# Patient Record
Sex: Female | Born: 1945 | Race: Black or African American | Hispanic: No | Smoking: Former smoker
Health system: Southern US, Community
[De-identification: ages and names within clinical notes are randomized; demographics above are authoritative.]

## PROBLEM LIST (undated history)

## (undated) DIAGNOSIS — N814 Uterovaginal prolapse, unspecified: Secondary | ICD-10-CM

## (undated) DIAGNOSIS — Z9889 Other specified postprocedural states: Secondary | ICD-10-CM

## (undated) DIAGNOSIS — G9340 Encephalopathy, unspecified: Secondary | ICD-10-CM

## (undated) DIAGNOSIS — N939 Abnormal uterine and vaginal bleeding, unspecified: Secondary | ICD-10-CM

## (undated) DIAGNOSIS — I10 Essential (primary) hypertension: Secondary | ICD-10-CM

## (undated) HISTORY — PX: TUBAL LIGATION: SHX77

## (undated) HISTORY — DX: Essential (primary) hypertension: I10

---

## 2013-07-31 ENCOUNTER — Ambulatory Visit (INDEPENDENT_AMBULATORY_CARE_PROVIDER_SITE_OTHER): Payer: Medicare PPO | Admitting: Family Medicine

## 2013-07-31 VITALS — BP 160/92 | HR 67 | Temp 98.8°F | Resp 20 | Ht 66.0 in | Wt 150.0 lb

## 2013-07-31 DIAGNOSIS — F411 Generalized anxiety disorder: Secondary | ICD-10-CM

## 2013-07-31 DIAGNOSIS — I10 Essential (primary) hypertension: Secondary | ICD-10-CM

## 2013-07-31 MED ORDER — AMLODIPINE BESYLATE 5 MG PO TABS: 5.0000 mg | ORAL_TABLET | Freq: Every day | ORAL | Status: DC

## 2013-07-31 MED ORDER — CITALOPRAM HYDROBROMIDE 20 MG PO TABS: 20.0000 mg | ORAL_TABLET | Freq: Every day | ORAL | Status: DC

## 2013-07-31 NOTE — Patient Instructions (Addendum)

## 2013-07-31 NOTE — Progress Notes (Signed)
Subjective:    Patient ID: Allison Chapman, female    DOB: 1946/09/12, 67 y.o.   MRN: [id number] HPI Review of Systems Objective:   Physical Exam Assessment & Plan:    @UMFCLOGO @  Patient ID: Allison Chapman MRN: [id number], DOB: Mar 24, 1946, 67 y.o. Date of Encounter: 07/31/2013, 12:54 PM This chart was scribed for Elvina Sidle, MD by Valera Castle, ED Scribe. This patient was seen in room 02 and the patient's care was started at 12:54.  Primary Physician: No primary provider on file.  Chief Complaint: Anxiety  HPI: 67 y.o. year old female with history below presents with gradual, moderate anxiety due to the stress from her 88 year old mother being unwell. Her mother is living with her sister at the moment. She states her mother's health has been declining rapidly for the last 6 months, and that it has been really hard on the rest of the family. She reports that her mother has been the backbone of the family.  She reports associated headaches and dizziness. She states that she has been able to sleep through the anxiety symptoms.   She reports prior problem with her blood pressure, and that she had been on a mild blood pressure medicine. Her blood pressure is currently high, and she needs new medicine.   She is a retired Midwife for 30 years. She reports going to [employer] [facility] [address]   She reports that she can come back in 1 month for a follow up.   Past Medical History  Diagnosis Date  . Hypertension      Home Meds: Prior to Admission medications   Not on File    Allergies: No Known Allergies  History   Social History  . Marital Status: Divorced    Spouse Name: N/A    Number of Children: N/A  . Years of Education: N/A   Occupational History  . Not on file.   Social History Main Topics  . Smoking status: Never Smoker   . Smokeless tobacco: Not on file  . Alcohol Use: Not on file  . Drug Use: Not on file  . Sexual Activity: Not on file    Other Topics Concern  . Not on file   Social History Narrative  . No narrative on file     Review of Systems: Constitutional: negative for chills, fever, night sweats, weight changes, or fatigue  HEENT: negative for vision changes, hearing loss, congestion, rhinorrhea, ST, epistaxis, or sinus pressure Cardiovascular: negative for chest pain or palpitations Respiratory: negative for hemoptysis, wheezing, shortness of breath, or cough Abdominal: negative for abdominal pain, nausea, vomiting, diarrhea, or constipation Dermatological: negative for rash Neurologic: negative for syncope. Positive for anxiety, headaches, and dizziness. All other systems reviewed and are otherwise negative with the exception to those above and in the HPI.   Physical Exam: Blood pressure 160/92, pulse 67, temperature 98.8 F (37.1 C), temperature source Oral, resp. rate 20, height 5\' 6"  (1.676 m), weight 150 lb (68.04 kg), SpO2 96.00%., Body mass index is 24.22 kg/(m^2). General: Well developed, well nourished, in no acute distress. Head: Normocephalic, atraumatic, eyes without discharge, sclera non-icteric, nares are without discharge. Bilateral auditory canals clear, TM's are without perforation, pearly grey and translucent with reflective cone of light bilaterally. Oral cavity moist, posterior pharynx without exudate, erythema, peritonsillar abscess, or post nasal drip.  Neck: Supple. No thyromegaly. Full ROM. No lymphadenopathy. Lungs: Clear bilaterally to auscultation without wheezes, rales, or rhonchi. Breathing is unlabored. Heart:  RRR with S1 S2. No murmurs, rubs, or gallops appreciated. Abdomen: Soft, non-tender, non-distended with normoactive bowel sounds. No hepatomegaly. No rebound/guarding. No obvious abdominal masses. Msk:  Strength and tone normal for age. Extremities/Skin: Warm and dry. No clubbing or cyanosis. No edema. No rashes or suspicious lesions. Neuro: Alert and oriented X 3. Moves all  extremities spontaneously. Gait is normal. CNII-XII grossly in tact. Psych:  Responds to questions appropriately with a normal affect.   Labs:   ASSESSMENT AND PLAN:  67 y.o. year old female with Anxiety state, unspecified - Plan: citalopram (CELEXA) 20 MG tablet  Hypertension - Plan: amLODipine (NORVASC) 5 MG tablet   Recheck one month  Signed, Elvina Sidle, MD 07/31/2013 12:54 PM

## 2014-07-24 ENCOUNTER — Other Ambulatory Visit: Payer: Self-pay | Admitting: Family Medicine

## 2014-10-12 ENCOUNTER — Ambulatory Visit (INDEPENDENT_AMBULATORY_CARE_PROVIDER_SITE_OTHER): Payer: Medicare PPO | Admitting: Emergency Medicine

## 2014-10-12 VITALS — BP 168/98 | HR 85 | Temp 98.7°F | Resp 18 | Ht 65.0 in | Wt 147.8 lb

## 2014-10-12 DIAGNOSIS — IMO0001 Reserved for inherently not codable concepts without codable children: Secondary | ICD-10-CM

## 2014-10-12 DIAGNOSIS — Z87891 Personal history of nicotine dependence: Secondary | ICD-10-CM

## 2014-10-12 DIAGNOSIS — Z9119 Patient's noncompliance with other medical treatment and regimen: Secondary | ICD-10-CM

## 2014-10-12 DIAGNOSIS — Z91199 Patient's noncompliance with other medical treatment and regimen due to unspecified reason: Secondary | ICD-10-CM

## 2014-10-12 DIAGNOSIS — I10 Essential (primary) hypertension: Secondary | ICD-10-CM

## 2014-10-12 DIAGNOSIS — J00 Acute nasopharyngitis [common cold]: Secondary | ICD-10-CM

## 2014-10-12 MED ORDER — AMLODIPINE BESYLATE 5 MG PO TABS: 5.0000 mg | ORAL_TABLET | Freq: Every day | ORAL | Status: DC

## 2014-10-12 NOTE — Patient Instructions (Signed)

## 2014-10-12 NOTE — Progress Notes (Signed)
[facility] [address] [place] [address] [phone]  Date:  10/12/2014   Name:  Allison Chapman   DOB:  Sep 10, 1946   MRN:  [id number]  PCP:  No primary care provider on file.    Chief Complaint: Chills; Medication Refill; and URI   History of Present Illness:  Allison Chapman is a 68 y.o. very pleasant female patient who presents with the following:   Patient has a history of hypertension and ran out of her medication "a lot of days ago."   She was seen by EMS yesterday for chills and not transported She never had any shortness of breath or chest pain, tightness or heaviness.  No peripheral edema No nausea or vomiting.  No diaphoresis She now says she has a "cold coming on".  Rhinorrhea and non productive cough No fever or chills, no sore throat.  No headache. No myalgias or arthralgias. No improvement with over the counter medications or other home remedies.  Denies other complaint or health concern today.   There are no active problems to display for this patient.   Past Medical History  Diagnosis Date  . Hypertension     Past Surgical History  Procedure Laterality Date  . Tubal ligation      History  Substance Use Topics  . Smoking status: Never Smoker   . Smokeless tobacco: Not on file  . Alcohol Use: Not on file    Family History  Problem Relation Age of Onset  . Hypertension Mother   . Hyperthyroidism Daughter     No Known Allergies  Medication list has been reviewed and updated.  Current Outpatient Prescriptions on File Prior to Visit  Medication Sig Dispense Refill  . amLODipine (NORVASC) 5 MG tablet Take 1 tablet (5 mg total) by mouth daily. PATIENT NEEDS OFFICE VISIT FOR ADDITIONAL REFILLS 30 tablet 0  . citalopram (CELEXA) 20 MG tablet Take 1 tablet (20 mg total) by mouth daily. 30 tablet 3   No current facility-administered medications on file prior to visit.    Review of Systems:  As per HPI, otherwise  negative.    Physical Examination: Filed Vitals:   10/12/14 1519  BP: 168/98  Pulse: 85  Temp: 98.7 F (37.1 C)  Resp: 18   Filed Vitals:   10/12/14 1519  Height: 5\' 5"  (1.651 m)  Weight: 147 lb 12.8 oz (67.042 kg)   Body mass index is 24.6 kg/(m^2). Ideal Body Weight: Weight in (lb) to have BMI = 25: 149.9  GEN: WDWN, NAD, Non-toxic, A & O x 3 HEENT: Atraumatic, Normocephalic. Neck supple. No masses, No LAD. Ears and Nose: No external deformity. CV: RRR, No M/G/R. No JVD. No thrill. No extra heart sounds. PULM: CTA B, no wheezes, crackles, rhonchi. No retractions. No resp. distress. No accessory muscle use. ABD: S, NT, ND, +BS. No rebound. No HSM. EXTR: No c/c/e NEURO Normal gait.  PSYCH: Normally interactive. Conversant. Not depressed or anxious appearing.  Calm demeanor.    Assessment and Plan: Hypertension Return for BP check and fasting for labs in one month Refill  Signed,  Ellison Carwin, MD

## 2015-01-15 ENCOUNTER — Ambulatory Visit (INDEPENDENT_AMBULATORY_CARE_PROVIDER_SITE_OTHER): Payer: Medicare PPO | Admitting: Family Medicine

## 2015-01-15 VITALS — BP 194/102 | HR 74 | Temp 98.2°F | Resp 17 | Ht 65.5 in | Wt 150.0 lb

## 2015-01-15 DIAGNOSIS — I10 Essential (primary) hypertension: Secondary | ICD-10-CM

## 2015-01-15 LAB — COMPREHENSIVE METABOLIC PANEL
ALT: 10 U/L (ref 0–35)
AST: 19 U/L (ref 0–37)
Albumin: 4.4 g/dL (ref 3.5–5.2)
Alkaline Phosphatase: 64 U/L (ref 39–117)
BILIRUBIN TOTAL: 0.5 mg/dL (ref 0.2–1.2)
BUN: 20 mg/dL (ref 6–23)
CALCIUM: 10 mg/dL (ref 8.4–10.5)
CHLORIDE: 102 meq/L (ref 96–112)
CO2: 25 meq/L (ref 19–32)
Creat: 1.16 mg/dL — ABNORMAL HIGH (ref 0.50–1.10)
GLUCOSE: 98 mg/dL (ref 70–99)
Potassium: 3.9 mEq/L (ref 3.5–5.3)
Sodium: 138 mEq/L (ref 135–145)
Total Protein: 8.2 g/dL (ref 6.0–8.3)

## 2015-01-15 LAB — POCT URINALYSIS DIPSTICK
BILIRUBIN UA: NEGATIVE
Glucose, UA: NEGATIVE
Ketones, UA: NEGATIVE
NITRITE UA: NEGATIVE
PROTEIN UA: 100
Spec Grav, UA: 1.015
UROBILINOGEN UA: 0.2
pH, UA: 6

## 2015-01-15 LAB — LIPID PANEL
Cholesterol: 255 mg/dL — ABNORMAL HIGH (ref 0–200)
HDL: 81 mg/dL (ref >=46)
LDL Cholesterol: 151 mg/dL — ABNORMAL HIGH (ref 0–99)
Total CHOL/HDL Ratio: 3.1 Ratio
Triglycerides: 116 mg/dL (ref <150)
VLDL: 23 mg/dL (ref 0–40)

## 2015-01-15 MED ORDER — AMLODIPINE BESYLATE 5 MG PO TABS: 5.0000 mg | ORAL_TABLET | Freq: Every day | ORAL | Status: DC

## 2015-01-15 MED ORDER — CHLORTHALIDONE 25 MG PO TABS: 100.0000 mg | ORAL_TABLET | Freq: Every day | ORAL | Status: DC

## 2015-01-15 NOTE — Patient Instructions (Signed)
Take the amlodipine 5 mg every evening  Take the chlorthalidone 25 mg 1/2 pill daily in morning  Get regular exercise  Return in 3 months to recheck on blood pressure

## 2015-01-15 NOTE — Progress Notes (Signed)
Subjective: 68 year old lady who has been doing fairly well. She is retired. She occasionally goes walking with her daughter, but does not have a regular exercise program. She mostly sits around but has grandkids cares for from time to time. No chest pains. No shortness of breath. No stomach issues. No family history of diabetes. Nocturia 1. She has been out of her blood pressure medicine for a couple of days. She used to smoke but no longer does.:  Objective: No major distress. TMs normal. Eyes PERRLA. Throat clear. Neck supple without nodes. No carotid bruits. Chest clear to auscultation. Heart regular without murmurs gallops or arrhythmias. Abdomen soft without masses or tenderness. No ankle edema.  Blood pressure retaken at 194/110.  Assessment: Hypertension, poorly controlled  Plan: cmet, lipids, urine dip  Resume amlodipine  Add chlorthalidone

## 2015-01-18 ENCOUNTER — Encounter: Payer: Self-pay | Admitting: Family Medicine

## 2015-01-21 ENCOUNTER — Telehealth: Payer: Self-pay

## 2015-01-21 MED ORDER — PRAVASTATIN SODIUM 20 MG PO TABS: 20.0000 mg | ORAL_TABLET | Freq: Every day | ORAL | Status: DC

## 2015-01-21 NOTE — Telephone Encounter (Signed)
Pt called about labs. Notified and rx sent to pharm

## 2015-07-31 ENCOUNTER — Inpatient Hospital Stay (HOSPITAL_COMMUNITY)
Admission: EM | Admit: 2015-07-31 | Discharge: 2015-08-07 | DRG: 823 | Disposition: A | Discharge status: Home Health | Payer: Medicare PPO | Attending: Internal Medicine | Admitting: Internal Medicine

## 2015-07-31 ENCOUNTER — Emergency Department (HOSPITAL_COMMUNITY): Payer: Medicare PPO

## 2015-07-31 ENCOUNTER — Encounter (HOSPITAL_COMMUNITY): Payer: Self-pay | Admitting: *Deleted

## 2015-07-31 DIAGNOSIS — C801 Malignant (primary) neoplasm, unspecified: Secondary | ICD-10-CM | POA: Insufficient documentation

## 2015-07-31 DIAGNOSIS — C775 Secondary and unspecified malignant neoplasm of intrapelvic lymph nodes: Secondary | ICD-10-CM | POA: Diagnosis present

## 2015-07-31 DIAGNOSIS — R591 Generalized enlarged lymph nodes: Secondary | ICD-10-CM | POA: Diagnosis present

## 2015-07-31 DIAGNOSIS — D649 Anemia, unspecified: Secondary | ICD-10-CM | POA: Diagnosis present

## 2015-07-31 DIAGNOSIS — W1830XA Fall on same level, unspecified, initial encounter: Secondary | ICD-10-CM | POA: Diagnosis present

## 2015-07-31 DIAGNOSIS — E559 Vitamin D deficiency, unspecified: Secondary | ICD-10-CM | POA: Diagnosis present

## 2015-07-31 DIAGNOSIS — G9340 Encephalopathy, unspecified: Secondary | ICD-10-CM

## 2015-07-31 DIAGNOSIS — Z87891 Personal history of nicotine dependence: Secondary | ICD-10-CM | POA: Diagnosis not present

## 2015-07-31 DIAGNOSIS — R410 Disorientation, unspecified: Secondary | ICD-10-CM | POA: Diagnosis present

## 2015-07-31 DIAGNOSIS — R918 Other nonspecific abnormal finding of lung field: Secondary | ICD-10-CM | POA: Diagnosis present

## 2015-07-31 DIAGNOSIS — Z8249 Family history of ischemic heart disease and other diseases of the circulatory system: Secondary | ICD-10-CM

## 2015-07-31 DIAGNOSIS — A4150 Gram-negative sepsis, unspecified: Secondary | ICD-10-CM

## 2015-07-31 DIAGNOSIS — Z79899 Other long term (current) drug therapy: Secondary | ICD-10-CM

## 2015-07-31 DIAGNOSIS — N950 Postmenopausal bleeding: Secondary | ICD-10-CM | POA: Diagnosis present

## 2015-07-31 DIAGNOSIS — E876 Hypokalemia: Secondary | ICD-10-CM | POA: Diagnosis not present

## 2015-07-31 DIAGNOSIS — R197 Diarrhea, unspecified: Secondary | ICD-10-CM | POA: Diagnosis not present

## 2015-07-31 DIAGNOSIS — I472 Ventricular tachycardia: Secondary | ICD-10-CM | POA: Diagnosis present

## 2015-07-31 DIAGNOSIS — Z807 Family history of other malignant neoplasms of lymphoid, hematopoietic and related tissues: Secondary | ICD-10-CM | POA: Diagnosis not present

## 2015-07-31 DIAGNOSIS — E785 Hyperlipidemia, unspecified: Secondary | ICD-10-CM | POA: Diagnosis present

## 2015-07-31 DIAGNOSIS — F0390 Unspecified dementia without behavioral disturbance: Secondary | ICD-10-CM | POA: Diagnosis present

## 2015-07-31 DIAGNOSIS — N814 Uterovaginal prolapse, unspecified: Secondary | ICD-10-CM | POA: Insufficient documentation

## 2015-07-31 DIAGNOSIS — B964 Proteus (mirabilis) (morganii) as the cause of diseases classified elsewhere: Secondary | ICD-10-CM | POA: Diagnosis not present

## 2015-07-31 DIAGNOSIS — N939 Abnormal uterine and vaginal bleeding, unspecified: Secondary | ICD-10-CM | POA: Insufficient documentation

## 2015-07-31 DIAGNOSIS — I10 Essential (primary) hypertension: Secondary | ICD-10-CM | POA: Diagnosis present

## 2015-07-31 LAB — COMPREHENSIVE METABOLIC PANEL
ALBUMIN: 3.5 g/dL (ref 3.5–5.0)
ALT: 16 U/L (ref 14–54)
ANION GAP: 16 — AB (ref 5–15)
AST: 35 U/L (ref 15–41)
Alkaline Phosphatase: 71 U/L (ref 38–126)
BILIRUBIN TOTAL: 0.8 mg/dL (ref 0.3–1.2)
BUN: 12 mg/dL (ref 6–20)
CHLORIDE: 97 mmol/L — AB (ref 101–111)
CO2: 26 mmol/L (ref 22–32)
Calcium: 14.5 mg/dL (ref 8.9–10.3)
Creatinine, Ser: 1.01 mg/dL — ABNORMAL HIGH (ref 0.44–1.00)
GFR calc Af Amer: >60 mL/min (ref >60)
GFR, EST NON AFRICAN AMERICAN: 55 mL/min — AB (ref >60)
Glucose, Bld: 109 mg/dL — ABNORMAL HIGH (ref 65–99)
POTASSIUM: 3.2 mmol/L — AB (ref 3.5–5.1)
Sodium: 139 mmol/L (ref 135–145)
TOTAL PROTEIN: 9.3 g/dL — AB (ref 6.5–8.1)

## 2015-07-31 LAB — TSH: TSH: 0.422 u[IU]/mL (ref 0.350–4.500)

## 2015-07-31 LAB — CBC WITH DIFFERENTIAL/PLATELET
BASOS ABS: 0 10*3/uL (ref 0.0–0.1)
Basophils Relative: 0 %
Eosinophils Absolute: 0 10*3/uL (ref 0.0–0.7)
Eosinophils Relative: 0 %
HEMATOCRIT: 42.9 % (ref 36.0–46.0)
HEMOGLOBIN: 14.7 g/dL (ref 12.0–15.0)
LYMPHS ABS: 0.8 10*3/uL (ref 0.7–4.0)
LYMPHS PCT: 7 %
MCH: 30.5 pg (ref 26.0–34.0)
MCHC: 34.3 g/dL (ref 30.0–36.0)
MCV: 89 fL (ref 78.0–100.0)
Monocytes Absolute: 0.7 10*3/uL (ref 0.1–1.0)
Monocytes Relative: 6 %
NEUTROS ABS: 9.3 10*3/uL — AB (ref 1.7–7.7)
Neutrophils Relative %: 87 %
Platelets: 505 10*3/uL — ABNORMAL HIGH (ref 150–400)
RBC: 4.82 MIL/uL (ref 3.87–5.11)
RDW: 15.3 % (ref 11.5–15.5)
WBC: 10.8 10*3/uL — AB (ref 4.0–10.5)

## 2015-07-31 LAB — URINALYSIS, ROUTINE W REFLEX MICROSCOPIC
Bilirubin Urine: NEGATIVE
Glucose, UA: NEGATIVE mg/dL
KETONES UR: 15 mg/dL — AB
LEUKOCYTES UA: NEGATIVE
NITRITE: NEGATIVE
PH: 6 (ref 5.0–8.0)
Protein, ur: 100 mg/dL — AB
SPECIFIC GRAVITY, URINE: 1.015 (ref 1.005–1.030)
Urobilinogen, UA: 1 mg/dL (ref 0.0–1.0)

## 2015-07-31 LAB — I-STAT TROPONIN, ED: Troponin i, poc: 0.06 ng/mL (ref 0.00–0.08)

## 2015-07-31 LAB — URINE MICROSCOPIC-ADD ON

## 2015-07-31 MED ORDER — ENOXAPARIN SODIUM 40 MG/0.4ML ~~LOC~~ SOLN
40.0000 mg | SUBCUTANEOUS | Status: DC
Start: 2015-07-31 — End: 2015-08-04
  Administered 2015-07-31 – 2015-08-03 (×4): 40 mg via SUBCUTANEOUS
  Filled 2015-07-31 (×4): qty 0.4

## 2015-07-31 MED ORDER — AMLODIPINE BESYLATE 5 MG PO TABS: 5.0000 mg | ORAL_TABLET | Freq: Every day | ORAL | Status: DC

## 2015-07-31 MED ORDER — POTASSIUM CHLORIDE CRYS ER 20 MEQ PO TBCR
40.0000 meq | EXTENDED_RELEASE_TABLET | Freq: Once | ORAL | Status: AC
  Administered 2015-07-31: 40 meq via ORAL
  Filled 2015-07-31: qty 2

## 2015-07-31 MED ORDER — SODIUM CHLORIDE 0.9 % IV BOLUS (SEPSIS)
1000.0000 mL | Freq: Once | INTRAVENOUS | Status: AC
  Administered 2015-07-31: 1000 mL via INTRAVENOUS

## 2015-07-31 MED ORDER — SODIUM CHLORIDE 0.9 % IV SOLN
INTRAVENOUS | Status: DC
  Administered 2015-07-31 – 2015-08-02 (×4): via INTRAVENOUS

## 2015-07-31 MED ORDER — OXYCODONE-ACETAMINOPHEN 5-325 MG PO TABS: 1.0000 | ORAL_TABLET | Freq: Once | ORAL | Status: DC

## 2015-07-31 MED ORDER — ACETAMINOPHEN 650 MG RE SUPP
650.0000 mg | Freq: Four times a day (QID) | RECTAL | Status: DC | PRN
Start: 2015-07-31 — End: 2015-08-07
  Administered 2015-08-05: 650 mg via RECTAL
  Filled 2015-07-31: qty 1

## 2015-07-31 MED ORDER — ONDANSETRON 4 MG PO TBDP: 4.0000 mg | ORAL_TABLET | Freq: Once | ORAL | Status: DC

## 2015-07-31 MED ORDER — CALCITONIN (SALMON) 200 UNIT/ML IJ SOLN
272.0000 [IU] | Freq: Two times a day (BID) | INTRAMUSCULAR | Status: DC
  Administered 2015-07-31 – 2015-08-01 (×2): 272 [IU] via INTRAMUSCULAR
  Filled 2015-07-31 (×4): qty 1.36

## 2015-07-31 MED ORDER — ACETAMINOPHEN 325 MG PO TABS
650.0000 mg | ORAL_TABLET | Freq: Four times a day (QID) | ORAL | Status: DC | PRN
  Administered 2015-08-04: 650 mg via ORAL
  Filled 2015-07-31: qty 2

## 2015-07-31 NOTE — ED Provider Notes (Addendum)
CSN: [id number]     Arrival date & time 07/31/15  1345 History   First MD Initiated Contact with Patient 07/31/15 1347     Chief Complaint  Patient presents with  . Altered Mental Status  . Fall     (Consider location/radiation/quality/duration/timing/severity/associated sxs/prior Treatment) HPI  69 year old female presenting today from home. Patient's friend went to go pick her up to go somewhere and she did not answer the door nor her phone. Patient was found down in the bathroom. She states that her right knee became weak and she fell down. Patient was mildly confused at first. However once EMS got there and since then she's been alert and oriented 3.  Patient had no symptoms before falling. She had no fever no cough, no shortness of breath, no dysuria. She has been having trouble with that right knee.   Past Medical History  Diagnosis Date  . Hypertension    Past Surgical History  Procedure Laterality Date  . Tubal ligation     Family History  Problem Relation Age of Onset  . Hypertension Mother   . Hyperthyroidism Daughter    Social History  Substance Use Topics  . Smoking status: Never Smoker   . Smokeless tobacco: None  . Alcohol Use: None   OB History    No data available     Review of Systems  Constitutional: Negative for fever, activity change and fatigue.  HENT: Negative for congestion.   Eyes: Negative for discharge.  Respiratory: Negative for cough and chest tightness.   Cardiovascular: Negative for chest pain.  Gastrointestinal: Negative for abdominal distention.  Genitourinary: Negative for dysuria and difficulty urinating.  Musculoskeletal: Negative for back pain and joint swelling.  Skin: Negative for rash.  Allergic/Immunologic: Negative for immunocompromised state.  Neurological: Negative for dizziness, seizures, speech difficulty and headaches.  Psychiatric/Behavioral: Positive for confusion. Negative for behavioral problems and agitation.       Allergies  Review of patient's allergies indicates no known allergies.  Home Medications   Prior to Admission medications   Medication Sig Start Date End Date Taking? Authorizing Provider  amLODipine (NORVASC) 5 MG tablet Take 1 tablet (5 mg total) by mouth daily. 01/15/15  Yes Posey Boyer, MD  chlorthalidone (HYGROTON) 25 MG tablet Take 4 tablets (100 mg total) by mouth daily. Patient not taking: Reported on 07/31/2015 01/15/15   Posey Boyer, MD  citalopram (CELEXA) 20 MG tablet Take 1 tablet (20 mg total) by mouth daily. Patient not taking: Reported on 07/31/2015 07/31/13   Robyn Haber, MD  pravastatin (PRAVACHOL) 20 MG tablet Take 1 tablet (20 mg total) by mouth daily. Patient not taking: Reported on 07/31/2015 01/21/15   Posey Boyer, MD   BP 149/109 mmHg  Temp(Src) 97.4 F (36.3 C) (Oral)  Resp 16  Ht 5\' 5"  (1.651 m)  SpO2 100% Physical Exam  Constitutional: She is oriented to person, place, and time. She appears well-developed and well-nourished.  HENT:  Head: Normocephalic and atraumatic.  Eyes: Conjunctivae are normal. Right eye exhibits no discharge.  Neck: Neck supple.  Cardiovascular: Normal rate, regular rhythm and normal heart sounds.   No murmur heard. Pulmonary/Chest: Effort normal and breath sounds normal. She has no wheezes. She has no rales.  Abdominal: Soft. She exhibits no distension. There is no tenderness.  Genitourinary:  Vaginal/uterus prolapse  Musculoskeletal: Normal range of motion. She exhibits no edema.  Mild eyrthema over right knee  Neurological: She is oriented to person, place, and time.  No cranial nerve deficit.  Skin: Skin is warm and dry. No rash noted. She is not diaphoretic.  Psychiatric: She has a normal mood and affect. Her behavior is normal.  Nursing note and vitals reviewed.   ED Course  Procedures (including critical care time) Labs Review Labs Reviewed  COMPREHENSIVE METABOLIC PANEL - Abnormal; Notable for the  following:    Potassium 3.2 (*)    Chloride 97 (*)    Glucose, Bld 109 (*)    Creatinine, Ser 1.01 (*)    Calcium 14.5 (*)    Total Protein 9.3 (*)    GFR calc non Af Amer 55 (*)    Anion gap 16 (*)    All other components within normal limits  CBC WITH DIFFERENTIAL/PLATELET - Abnormal; Notable for the following:    WBC 10.8 (*)    Platelets 505 (*)    Neutro Abs 9.3 (*)    All other components within normal limits  URINE CULTURE  URINALYSIS, ROUTINE W REFLEX MICROSCOPIC (NOT AT [facility])  I-STAT TROPOININ, ED    Imaging Review Dg Chest 2 View  07/31/2015   CLINICAL DATA:  [place] this morning, hypertension, weakness in legs for 2 months  EXAM: CHEST  2 VIEW  COMPARISON:  None  FINDINGS: Normal heart size, mediastinal contours and pulmonary vascularity.  Calcification and mild tortuosity of thoracic aorta.  Mild bibasilar atelectasis.  Lungs otherwise clear.  No pleural effusion or pneumothorax.  Bones demineralized.  IMPRESSION: Mild bibasilar atelectasis.   Electronically Signed   By: Lavonia Dana M.D.   On: 07/31/2015 16:00   Dg Knee Complete 4 Views Right  07/31/2015   CLINICAL DATA:  Acute right knee pain after falling this morning. Initial encounter.  EXAM: RIGHT KNEE - COMPLETE 4+ VIEW  COMPARISON:  None.  FINDINGS: There is no evidence of fracture, dislocation, or joint effusion. There is no evidence of arthropathy or other focal bone abnormality. Soft tissues are unremarkable.  IMPRESSION: Normal right knee.   Electronically Signed   By: Marijo Conception, M.D.   On: 07/31/2015 16:01   I have personally reviewed and evaluated these images and lab results as part of my medical decision-making.   EKG Interpretation   Date/Time:  Thursday July 31 2015 13:57:54 EDT Ventricular Rate:  108 PR Interval:  171 QRS Duration: 88 QT Interval:  345 QTC Calculation: 462 R Axis:   67 Text Interpretation:  Sinus tachycardia Borderline repolarization  abnormality no acute ischemia Confirmed  by Gerald Leitz ([id number]) on  07/31/2015 4:10:27 PM      MDM   Final diagnoses:  None    Patient is a 69 year old female presenting with fall in the bathroom. Patient has small abrasion to her right knee. Patient was confused when family arrived before EMS.Since she has been here she is alert and oriented 3. Her neuro exam is normal.  We will do screening labs for the elderly including UA ,chest x-ray. We'll get a CT brain given her brief time of confusion.  Will get xray of right knee for abrasion and knee pain.    4:24 PM Hypercalcemia. Will admit to work up. Patietn has no PCP and goes to assorted urgent care for assorted BP meds.    Kemonie Cutillo Julio Alm, MD 07/31/15 [place], MD 07/31/15 1625

## 2015-07-31 NOTE — H&P (Signed)
Date: 07/31/2015               Patient Name:  Allison Chapman MRN: [id number]  DOB: 1946/02/02 Age / Sex: 69 y.o., female   PCP: No primary care provider on file.              Medical Service: Internal Medicine Teaching Service              Attending Physician: Dr. Sid Falcon, MD    First Contact: Florentina Addison, MS 4 Pager: [phone]  Second Contact: Dr. Osa Craver Pager: [phone]  Third Contact Dr.  Karalee Height: [phone]-       After Hours (After 5p/  First Contact Pager: [phone]  weekends / holidays): Second Contact Pager: [phone]   Chief Complaint:  Fall and Altered Mental Status  History of Present Illness: Allison Chapman is a 69 y.o. female with a PMH of HTN who presented to the [facility] ED following a fall and brief episode of altered mental status. Patient was found down in a state of altered mental status at home by her daughter. EMS was called and patients mental status had improved and she was alert and oriented by arrival.  Patient states that the fall occurred when she felt her right knee give out. She struck her right knee during the fall.  She denies hitting her head and states that she was only on the ground for a brief period of time. She also  denies  dizziness, weakness, palpatations or loss of consciousness prior to or after the fall. She endorses a several month history of weakness in her right knee. Additionally, she endorses a 3-4 day history of decreased appetite, fatigue and polydipsia. She denies fevers, sweats, chills, weight loss, dysuria, hematuria, chest pain, joint pain, bone pain, breast lumps, SOB, vomiting, hematochezia, diarrhea and constipation. She denies any history of malignancy, thyroid disorders, or kidney stones. Her only medication is amlodipine for HTN. She does not take any supplements or OTC medication.   Of note, patient has not had a PCP in years. Given this and her age, it has been over 10 years since her last mammogram, pap smear, and  physical.   Of note, patient was found to be markedly hypercalcemic at 14.5 n the ED. She was also mildly hypokalemic at 3.2.  Head CT revealed no acute processes concerning for IC bleeding. Xray of the right knee did not reveal any fractures or bony abnormalities. She received 2L of NS while in the ED with some resolution of symptoms. He daughter states that she is mentally at her baseline.   Meds: No current facility-administered medications for this encounter.   Current Outpatient Prescriptions  Medication Sig Dispense Refill  . amLODipine (NORVASC) 5 MG tablet Take 1 tablet (5 mg total) by mouth daily. 90 tablet 3  . chlorthalidone (HYGROTON) 25 MG tablet Take 4 tablets (100 mg total) by mouth daily. (Patient not taking: Reported on 07/31/2015) 45 tablet 5  . citalopram (CELEXA) 20 MG tablet Take 1 tablet (20 mg total) by mouth daily. (Patient not taking: Reported on 07/31/2015) 30 tablet 3  . pravastatin (PRAVACHOL) 20 MG tablet Take 1 tablet (20 mg total) by mouth daily. (Patient not taking: Reported on 07/31/2015) 30 tablet 3    Allergies: Allergies as of 07/31/2015  . (No Known Allergies)   Past Medical History  Diagnosis Date  . Hypertension    Past Surgical History  Procedure Laterality Date  . Tubal ligation  Family History  Problem Relation Age of Onset  . Hypertension Mother   . Hyperthyroidism Daughter    Social History   Social History  . Marital Status: Divorced    Spouse Name: N/A  . Number of Children: N/A  . Years of Education: N/A   Occupational History  . Not on file.   Social History Main Topics  . Smoking status: Never Smoker   . Smokeless tobacco: Not on file  . Alcohol Use: Not on file  . Drug Use: Not on file  . Sexual Activity: Not on file   Other Topics Concern  . Not on file   Social History Narrative    Review of Systems: Pertinent items noted in HPI and remainder of comprehensive ROS otherwise negative.  Physical Exam: Blood  pressure 158/91, pulse 105, temperature 97.4 F (36.3 C), temperature source Oral, resp. rate 19, height 5' 5"  (1.651 m), SpO2 95 %.  Physical Exam  Constitutional: She is oriented to person, place, and time.  Pleasant, thin, elderly female laying in bed in NAD  HENT:  Head: Normocephalic and atraumatic.  Right Ear: External ear normal.  Left Ear: External ear normal.  Mouth/Throat: No oropharyngeal exudate.  No evidence of hemotympanum bilaterally  Eyes: Conjunctivae and EOM are normal. Pupils are equal, round, and reactive to light. No scleral icterus.  Neck: Normal range of motion. No JVD present. No tracheal deviation present. No thyromegaly present.  Cardiovascular: Regular rhythm.  Exam reveals no gallop and no friction rub.   No murmur heard. Tachycardic    Pulmonary/Chest: Effort normal and breath sounds normal. She has no wheezes.  Abdominal: Soft. Bowel sounds are normal. She exhibits no distension. There is no tenderness. There is no rebound and no guarding.  Musculoskeletal: Normal range of motion. She exhibits edema (Some edema on right knee).  Lymphadenopathy:    She has no cervical adenopathy.  Neurological: She is alert and oriented to person, place, and time. No cranial nerve deficit. She exhibits normal muscle tone. Coordination normal.  Skin: Skin is warm and dry. No rash noted.  Dark plaque on breast, consistent with Seborrheic keratosis   Psychiatric: She has a normal mood and affect.     Lab results: Results for orders placed or performed during the hospital encounter of 07/31/15 (from the past 24 hour(s))  I-stat troponin, ED     Status: None   Collection Time: 07/31/15  2:41 PM  Result Value Ref Range   Troponin i, poc 0.06 0.00 - 0.08 ng/mL   Comment 3          Comprehensive metabolic panel     Status: Abnormal   Collection Time: 07/31/15  3:19 PM  Result Value Ref Range   Sodium 139 135 - 145 mmol/L   Potassium 3.2 (L) 3.5 - 5.1 mmol/L   Chloride  97 (L) 101 - 111 mmol/L   CO2 26 22 - 32 mmol/L   Glucose, Bld 109 (H) 65 - 99 mg/dL   BUN 12 6 - 20 mg/dL   Creatinine, Ser 1.01 (H) 0.44 - 1.00 mg/dL   Calcium 14.5 (HH) 8.9 - 10.3 mg/dL   Total Protein 9.3 (H) 6.5 - 8.1 g/dL   Albumin 3.5 3.5 - 5.0 g/dL   AST 35 15 - 41 U/L   ALT 16 14 - 54 U/L   Alkaline Phosphatase 71 38 - 126 U/L   Total Bilirubin 0.8 0.3 - 1.2 mg/dL   GFR calc non Af Wyvonnia Lora  55 (L) >60 mL/min   GFR calc Af Amer >60 >60 mL/min   Anion gap 16 (H) 5 - 15  CBC with Differential/Platelet     Status: Abnormal   Collection Time: 07/31/15  3:19 PM  Result Value Ref Range   WBC 10.8 (H) 4.0 - 10.5 K/uL   RBC 4.82 3.87 - 5.11 MIL/uL   Hemoglobin 14.7 12.0 - 15.0 g/dL   HCT 42.9 36.0 - 46.0 %   MCV 89.0 78.0 - 100.0 fL   MCH 30.5 26.0 - 34.0 pg   MCHC 34.3 30.0 - 36.0 g/dL   RDW 15.3 11.5 - 15.5 %   Platelets 505 (H) 150 - 400 K/uL   Neutrophils Relative % 87 %   Neutro Abs 9.3 (H) 1.7 - 7.7 K/uL   Lymphocytes Relative 7 %   Lymphs Abs 0.8 0.7 - 4.0 K/uL   Monocytes Relative 6 %   Monocytes Absolute 0.7 0.1 - 1.0 K/uL   Eosinophils Relative 0 %   Eosinophils Absolute 0.0 0.0 - 0.7 K/uL   Basophils Relative 0 %   Basophils Absolute 0.0 0.0 - 0.1 K/uL     Imaging results:  Dg Chest 2 View  07/31/2015   CLINICAL DATA:  [place] this morning, hypertension, weakness in legs for 2 months  EXAM: CHEST  2 VIEW  COMPARISON:  None  FINDINGS: Normal heart size, mediastinal contours and pulmonary vascularity.  Calcification and mild tortuosity of thoracic aorta.  Mild bibasilar atelectasis.  Lungs otherwise clear.  No pleural effusion or pneumothorax.  Bones demineralized.  IMPRESSION: Mild bibasilar atelectasis.   Electronically Signed   By: Lavonia Dana M.D.   On: 07/31/2015 16:00   Ct Head Wo Contrast  07/31/2015   CLINICAL DATA:  69YOF right knee gave out on her today and she lowered herself to the floor. Per EMS pts family stated that pt has been slow to respond today and  that she has a Possible right side facial droop. EMS noted foul smelling urine  EXAM: CT HEAD WITHOUT CONTRAST  TECHNIQUE: Contiguous axial images were obtained from the base of the skull through the vertex without intravenous contrast.  COMPARISON:  None.  FINDINGS: Probable retention cysts or polyps in the maxillary sinuses, incompletely visualized. Atherosclerotic and physiologic intracranial calcifications. Early right basal ganglia mineralization. Diffuse parenchymal atrophy. Patchy areas of hypoattenuation in deep and periventricular white matter bilaterally. Negative for acute intracranial hemorrhage, mass lesion, acute infarction, midline shift, or mass-effect. Acute infarct may be inapparent on noncontrast CT. Ventricles and sulci symmetric. Bone windows demonstrate no focal lesion.  IMPRESSION: 1. Negative for bleed or other acute intracranial process. 2. Atrophy and nonspecific white matter changes.   Electronically Signed   By: Lucrezia Europe M.D.   On: 07/31/2015 16:14   Dg Knee Complete 4 Views Right  07/31/2015   CLINICAL DATA:  Acute right knee pain after falling this morning. Initial encounter.  EXAM: RIGHT KNEE - COMPLETE 4+ VIEW  COMPARISON:  None.  FINDINGS: There is no evidence of fracture, dislocation, or joint effusion. There is no evidence of arthropathy or other focal bone abnormality. Soft tissues are unremarkable.  IMPRESSION: Normal right knee.   Electronically Signed   By: Marijo Conception, M.D.   On: 07/31/2015 16:01    Other results: EKG: sinus tachycardia, no ischemic changes.  Assessment & Plan by Problem: Active Problems:   Hypercalcemia  Summary: DEION FORGUE is a 69 y.o. female with a PMH of  HTN who presented to the ED for AMS following a fall. CT was negative for acute process. Patient found to be markedly hypercalcemic at 14.5. Concerning for hypercalcemia of malignancy vs MM vs hyperparathyroidism  ##Hypercalcemia- Ca: 14.5. Given patient's age concerning for  hypercalcemia of malignancy, multiple myeloma, hyperparathyroidism.  -IVF: Normal Saline 200 cc/hr until 100-150 cc urine output/hr -Calcitonin 4 U/kg BID -repeat Ca at midnight -CBC and CMP in morning  -f/u PTH -f/u PTH related peptide -f/u 25-hydroxy vitamin D & 1,25 dihydroxyvitamin d -f/u SPEP  ##Alerted Mental Status: Likely 2/2 to hypercalcemia. Also included in differential are UTI, stroke and IC bleed. Negative CT and normal neuro exam virtually exclude stoke or IC bleed.  -f/u Urinalysis -f/u CBC  ##Hypertension;  -continue home amlodipine 5 mg daily   ##DVT PPx:  -Lovenox 40 SQ daily  ##Dispo: Dispostion deferred at this time, awaiting improvement of symptoms. Anticipated discharge in 1-2 days.    This is a Careers information officer Note.  The care of the patient was discussed with Dr. Marvel Plan and the assessment and plan was formulated with their assistance.  Please see their note for official documentation of the patient encounter.   Signed: Florentina Addison, Med Student 07/31/2015, 5:38 PM

## 2015-07-31 NOTE — H&P (Signed)
Date: 07/31/2015               Patient Name:  Allison Chapman MRN: [id number]  DOB: 11-Jun-1946 Age / Sex: 69 y.o., female   PCP: No primary care provider on file.         Medical Service: Internal Medicine Teaching Service         Attending Physician: Dr. Sid Falcon, MD    First Contact: Florentina Addison, [place] Pager: [phone]  Second Contact: Dr. Marvel Plan Pager: [phone]       After Hours (After 5p/  First Contact Pager: [phone]  weekends / holidays): Second Contact Pager: [phone]   Chief Complaint: confusion  History of Present Illness: Allison Chapman is a 69 yo female with PMHx of HTN and HLD who presented to the ED after falling. She states she went to the bathroom and fell to the floor. She states she was not on the floor for very long. Patient's sister and daughter had arrived at the home and found her on the bathroom floor. She was confused and altered which lasted for about 30 minutes. Patient doesn't know what caused her to fall and denies any symptoms prior to falling. She states she just "slipped down." Her right knee felt weak at the time. She denies any chest pain, shortness of breath, lightheadedness or confusion prior to the fall. She did hit her right knee when she fell to the ground and she is currently having some pain in her right knee. Patient's sister and daughter found her on the floor in the bathroom and called EMS.   In the ED, vitals were stable. CBC wnl. LFTs wnl. CXR showed mild bibasilar atelectasis. CT head showed no signs of bleeding or other acute abnormalities. EKG showed sinus tachycardia. CMET revealed hypercalemia of 14.5 (corrected 14.9). Albumin 3.5.   She does admit to increased polydipsia for 1-2 days. She states she has had a decreased appetite and decreased food intake for the past 3-4 days which she contributes to nausea. Patient also agrees to increased fatigue for the last few weeks which her daughter who lives with her agrees with. Patient has had  right knee pain for the past 3 months, but denies any weakness or any bone pain.   Patient denies fever, chills, weight loss, night sweats, chest pain, shortness of breath, palpitations, cough, hemoptysis, abdominal pain, diarrhea, constipation, blood in her stool, increased urination, dysuria, decreased urination. She denies any decreased concentration or increased confusion recently other than the episode today.   Patient denies taking any new supplements such as vitamins. Patient did recently start a new blood pressure medication recently, amlodipine. She was previously on chlorthalidone, but has not been taking it in a while.   Patient has never had any surgeries. She has never had any kidney stones, pancreatitis, or stomach ulcers. She has not had a mammogram in greater than 10 years, but denies feeling any new lumps. Her last mammogram was normal. She has not had a pap smear in a long time, but her last pap was reportedly normal. She has never had a colonoscopy. She has never had her thyroid checked.   Family Hx: Patient's mother had lymphoma, recently passed at 95.   Social Hx: Previous smoker <1ppd for about 10-15 years. Denies alcohol use. Denies IVDU.   Meds: Current Facility-Administered Medications  Medication Dose Route Frequency Provider Last Rate Last Dose  . 0.9 %  sodium chloride infusion   Intravenous Continuous Alexa Sherral Hammers,  MD      . acetaminophen (TYLENOL) tablet 650 mg  650 mg Oral Q6H PRN Alexa Sherral Hammers, MD       Or  . acetaminophen (TYLENOL) suppository 650 mg  650 mg Rectal Q6H PRN Alexa Sherral Hammers, MD      . Derrill Memo ON 08/01/2015] amLODipine (NORVASC) tablet 5 mg  5 mg Oral Daily Alexa Sherral Hammers, MD      . calcitonin (MIACALCIN) injection 272 Units  272 Units Intramuscular BID Alexa Sherral Hammers, MD      . enoxaparin (LOVENOX) injection 40 mg  40 mg Subcutaneous Q24H Alexa Sherral Hammers, MD      . potassium chloride SA (K-DUR,KLOR-CON) CR tablet 40 mEq  40  mEq Oral Once Alexa Sherral Hammers, MD        Allergies: Allergies as of 07/31/2015  . (No Known Allergies)   Past Medical History  Diagnosis Date  . Hypertension    Past Surgical History  Procedure Laterality Date  . Tubal ligation     Family History  Problem Relation Age of Onset  . Hypertension Mother   . Cancer Mother   . Hyperthyroidism Daughter    Social History   Social History  . Marital Status: Divorced    Spouse Name: N/A  . Number of Children: N/A  . Years of Education: N/A   Occupational History  . Not on file.   Social History Main Topics  . Smoking status: Never Smoker   . Smokeless tobacco: Not on file  . Alcohol Use: Not on file  . Drug Use: Not on file  . Sexual Activity: Not on file   Other Topics Concern  . Not on file   Social History Narrative    Review of Systems: General: Admits to fatigue and decreased appetite. Denies fever, chills, weight loss, night sweats, and diaphoresis.  Respiratory: Denies SOB, cough, DOE, chest tightness, and wheezing.   Cardiovascular: Denies chest pain and palpitations.  Gastrointestinal: Admits to nausea. Denies vomiting, abdominal pain, diarrhea, constipation, blood in stool and abdominal distention.  Genitourinary: Denies dysuria, urgency, frequency, hematuria, suprapubic pain and flank pain. Endocrine: Admits to polydipsia. Denies hot or cold intolerance, polyuria. Musculoskeletal: Admits to right knee. Denies myalgias, back pain, joint swelling, and gait problem.  Skin: Denies pallor, rash and wounds.  Neurological: Denies dizziness, headaches, weakness, lightheadedness, numbness, seizures, and syncope Psychiatric/Behavioral: Admits to confusion (resolved).   Physical Exam: Filed Vitals:   07/31/15 1445 07/31/15 1500 07/31/15 1615 07/31/15 1630  BP: 173/101 168/94  158/91  Pulse: 102 98 104 105  Temp:      TempSrc:      Resp: 17 16 17 19   Height:      SpO2: 96% 96% 95% 95%   General: Vital signs  reviewed.  Patient is elderly, well-developed and well-nourished, in no acute distress and cooperative with exam.  Head: Normocephalic and atraumatic. Eyes: EOMI, conjunctivae normal, no scleral icterus.  Neck: Supple, trachea midline, normal ROM, no JVD, masses, thyromegaly. No anterior or posterior cervical lymphadenopathy. No supraclavicular lymph nodes.  Cardiovascular: Tachycardic, regular rhythm, S1 normal, S2 normal, no murmurs, gallops, or rubs. Breasts: breasts appear normal, no suspicious masses, no skin or nipple changes or axillary nodes. No axillary lymphadenopathy. Seborrheic keratosis on right breast.  Pulmonary/Chest: Clear to auscultation bilaterally, no wheezes, rales, or rhonchi. Abdominal: Soft, non-tender, non-distended, BS +, no masses, organomegaly, or guarding present.  Musculoskeletal: Erythematous abrasion on right knee. Non-tender, no effusions.  Extremities: No  lower extremity edema bilaterally, pulses symmetric and intact bilaterally.  Neurological: A&O x2 (not year), Strength is normal and symmetric bilaterally, cranial nerve II-XII are grossly intact, no focal motor deficit, sensory intact to light touch bilaterally.  Skin: Warm, dry and intact. No rashes or erythema. Psychiatric: Normal mood and affect. Speech is slow and behavior is normal. Cognition and memory are normal.   Lab results: Basic Metabolic Panel:  Recent Labs  07/31/15 1519  NA 139  K 3.2*  CL 97*  CO2 26  GLUCOSE 109*  BUN 12  CREATININE 1.01*  CALCIUM 14.5*   Liver Function Tests:  Recent Labs  07/31/15 1519  AST 35  ALT 16  ALKPHOS 71  BILITOT 0.8  PROT 9.3*  ALBUMIN 3.5   CBC:  Recent Labs  07/31/15 1519  WBC 10.8*  NEUTROABS 9.3*  HGB 14.7  HCT 42.9  MCV 89.0  PLT 505*   Imaging results:  Dg Chest 2 View  07/31/2015   CLINICAL DATA:  [place] this morning, hypertension, weakness in legs for 2 months  EXAM: CHEST  2 VIEW  COMPARISON:  None  FINDINGS: Normal heart  size, mediastinal contours and pulmonary vascularity.  Calcification and mild tortuosity of thoracic aorta.  Mild bibasilar atelectasis.  Lungs otherwise clear.  No pleural effusion or pneumothorax.  Bones demineralized.  IMPRESSION: Mild bibasilar atelectasis.   Electronically Signed   By: Lavonia Dana M.D.   On: 07/31/2015 16:00   Ct Head Wo Contrast  07/31/2015   CLINICAL DATA:  69YOF right knee gave out on her today and she lowered herself to the floor. Per EMS pts family stated that pt has been slow to respond today and that she has a Possible right side facial droop. EMS noted foul smelling urine  EXAM: CT HEAD WITHOUT CONTRAST  TECHNIQUE: Contiguous axial images were obtained from the base of the skull through the vertex without intravenous contrast.  COMPARISON:  None.  FINDINGS: Probable retention cysts or polyps in the maxillary sinuses, incompletely visualized. Atherosclerotic and physiologic intracranial calcifications. Early right basal ganglia mineralization. Diffuse parenchymal atrophy. Patchy areas of hypoattenuation in deep and periventricular white matter bilaterally. Negative for acute intracranial hemorrhage, mass lesion, acute infarction, midline shift, or mass-effect. Acute infarct may be inapparent on noncontrast CT. Ventricles and sulci symmetric. Bone windows demonstrate no focal lesion.  IMPRESSION: 1. Negative for bleed or other acute intracranial process. 2. Atrophy and nonspecific white matter changes.   Electronically Signed   By: Lucrezia Europe M.D.   On: 07/31/2015 16:14   Dg Knee Complete 4 Views Right  07/31/2015   CLINICAL DATA:  Acute right knee pain after falling this morning. Initial encounter.  EXAM: RIGHT KNEE - COMPLETE 4+ VIEW  COMPARISON:  None.  FINDINGS: There is no evidence of fracture, dislocation, or joint effusion. There is no evidence of arthropathy or other focal bone abnormality. Soft tissues are unremarkable.  IMPRESSION: Normal right knee.   Electronically  Signed   By: Marijo Conception, M.D.   On: 07/31/2015 16:01    Other results: EKG: sinus tachycardia, no priors for comparison.  Assessment & Plan by Problem: Active Problems:   Hypercalcemia  Severe Symptomatic Hypercalcemia: Patient presents with confusion after fall. Work up in the ED is significant for hypercalcemia with corrected calcium 14.9 with albumin 3.5. Last calcium normal 10 with albumin of 4.4 in March 2016. Patient falls under severe symptomatic hypercalcemia with a calcium > 14 and symptoms of confusion, polydipsia,  fatigue and nausea. Given the severity of calcemia and the quick onset, hypercalcemia is more concerning for and consistent with malignancy versus primary hyperparathyroidism. However, differential includes primary hyperparathyroidism, hypercalcemia of malignancy, thiazide diuretics (less likely as patient is no longer taking-previously on chlorthalidone), hyperthyroidism, and adrenal insufficiency. Patient was admitted for treatment of hypercalcemia and further work up. Patient received 2L of NS boluses in the ED.  -NS 200 cc/hr, titrate to 100-150 cc urine output per hour  -Calcitonin 272 units (4 units/kg) BID, can increase to Q6H if not effective -Repeat Calcium at 0000 -Repeat CMET tomorrow am -Repeat Calcium now -PTH and ionized calcium (for primary hyperparathyroidism) -PTH-related peptide (for humoral hypercalcemia of malignancy if elevated) -TSH  -Vitamin D 25-OH (think vitamin D intoxication if elevated-doubt) -Vitamin D 1,25-dihydroxy (think lymphoma, granulomatous disease if elevated) -SPEP and light chains to rule out multiple myeloma -Will need to reassess tonight to ensure patient is not becoming volume overloaded  -Consider zoledronic acid IV if hypercalcemia persists despite IVF and calcitonin  Acute Encephalopathy: Resolved. Patient presents after confusion after a fall this morning. No symptoms prior to the fall, and patient did not hit her head.  Confusion has now resolved, and per family patient is at her baseline. Patient's vital signs are stable. Labs as above, CXR wnl, CT head normal, neuro exam normal, EKG shows sinus tachycardia. Doubt ACS, CVA, TIA. Patient does have foul smelling urine, but no symptoms, afebrile, very mild leukocytosis 10.8. ED checked UA and UCx. Acute encephalopathy likely secondary to hypercalcemia.  -Urinalysis -Urine culture -CBC/CMET tomorrow am -Treat hypercalcemia as above  HTN: 149/109 on admission. Patient is on amlodipine at home.  -Continue amlodipine 5 mg daily  Hypokalemia: K 3.2 on admission. -Kdur 40 mEq once -Repeat CMET tomorrow am  DVT/PE ppx: Lovenox SQ QD  Dispo: Disposition is deferred at this time, awaiting improvement of current medical problems. Anticipated discharge in approximately 1-2 day(s).   The patient does not have a current PCP (No primary care provider on file.) and does need an [facility] hospital follow-up appointment after discharge.  The patient does not have transportation limitations that hinder transportation to clinic appointments.  Signed: Osa Craver, DO PGY-2 Internal Medicine Resident Pager # [phone] 07/31/2015 6:39 PM

## 2015-07-31 NOTE — ED Notes (Signed)
Pt left for CT.

## 2015-07-31 NOTE — ED Notes (Signed)
Pt's right knee gave out on her today and she lowered herself to the floor.  Per EMS pts family stated that pt has been slow to respond today and that she has a Possible right side facial droop.  EMS noted foul smelling urine at the scene.  Pts right knee has bothered her for quite sometime.  EMS brought her to the ED for further evaluation.  EMS noted that that there were no stroke deficits noted.  VS per ems are as follows: BP:180/108 HR: 110 CBG: 112 SPO2: 100% on RA

## 2015-07-31 NOTE — ED Notes (Signed)
Attempted report X1

## 2015-08-01 ENCOUNTER — Other Ambulatory Visit (HOSPITAL_COMMUNITY)
Admission: RE | Admit: 2015-08-01 | Discharge: 2015-08-01 | Disposition: A | Discharge status: Home / Self Care | Payer: Medicare PPO | Source: Ambulatory Visit | Attending: Internal Medicine | Admitting: Internal Medicine

## 2015-08-01 DIAGNOSIS — E785 Hyperlipidemia, unspecified: Secondary | ICD-10-CM

## 2015-08-01 DIAGNOSIS — N938 Other specified abnormal uterine and vaginal bleeding: Secondary | ICD-10-CM

## 2015-08-01 DIAGNOSIS — I472 Ventricular tachycardia: Secondary | ICD-10-CM

## 2015-08-01 DIAGNOSIS — I10 Essential (primary) hypertension: Secondary | ICD-10-CM

## 2015-08-01 DIAGNOSIS — E559 Vitamin D deficiency, unspecified: Secondary | ICD-10-CM | POA: Diagnosis present

## 2015-08-01 DIAGNOSIS — Z01411 Encounter for gynecological examination (general) (routine) with abnormal findings: Secondary | ICD-10-CM | POA: Insufficient documentation

## 2015-08-01 DIAGNOSIS — E876 Hypokalemia: Secondary | ICD-10-CM

## 2015-08-01 DIAGNOSIS — N814 Uterovaginal prolapse, unspecified: Secondary | ICD-10-CM

## 2015-08-01 LAB — PROTEIN ELECTROPHORESIS, SERUM
A/G Ratio: 0.6 — ABNORMAL LOW (ref 0.7–1.7)
ALPHA-2-GLOBULIN: 1.3 g/dL — AB (ref 0.4–1.0)
Albumin ELP: 3.1 g/dL (ref 2.9–4.4)
Alpha-1-Globulin: 0.4 g/dL (ref 0.0–0.4)
BETA GLOBULIN: 1.3 g/dL (ref 0.7–1.3)
GAMMA GLOBULIN: 2.4 g/dL — AB (ref 0.4–1.8)
Globulin, Total: 5.5 g/dL — ABNORMAL HIGH (ref 2.2–3.9)
Total Protein ELP: 8.6 g/dL — ABNORMAL HIGH (ref 6.0–8.5)

## 2015-08-01 LAB — COMPREHENSIVE METABOLIC PANEL
ALK PHOS: 65 U/L (ref 38–126)
ALT: 17 U/L (ref 14–54)
AST: 37 U/L (ref 15–41)
Albumin: 2.9 g/dL — ABNORMAL LOW (ref 3.5–5.0)
Anion gap: 14 (ref 5–15)
BUN: 9 mg/dL (ref 6–20)
CALCIUM: 12 mg/dL — AB (ref 8.9–10.3)
CHLORIDE: 96 mmol/L — AB (ref 101–111)
CO2: 26 mmol/L (ref 22–32)
CREATININE: 0.89 mg/dL (ref 0.44–1.00)
Glucose, Bld: 106 mg/dL — ABNORMAL HIGH (ref 65–99)
Potassium: 2.8 mmol/L — ABNORMAL LOW (ref 3.5–5.1)
SODIUM: 136 mmol/L (ref 135–145)
Total Bilirubin: 0.7 mg/dL (ref 0.3–1.2)
Total Protein: 7.8 g/dL (ref 6.5–8.1)

## 2015-08-01 LAB — CBC
HCT: 34.9 % — ABNORMAL LOW (ref 36.0–46.0)
Hemoglobin: 11.7 g/dL — ABNORMAL LOW (ref 12.0–15.0)
MCH: 29.8 pg (ref 26.0–34.0)
MCHC: 33.5 g/dL (ref 30.0–36.0)
MCV: 88.8 fL (ref 78.0–100.0)
PLATELETS: 558 10*3/uL — AB (ref 150–400)
RBC: 3.93 MIL/uL (ref 3.87–5.11)
RDW: 15.4 % (ref 11.5–15.5)
WBC: 10.8 10*3/uL — ABNORMAL HIGH (ref 4.0–10.5)

## 2015-08-01 LAB — KAPPA/LAMBDA LIGHT CHAINS
Kappa free light chain: 50.64 mg/L — ABNORMAL HIGH (ref 3.30–19.40)
Kappa, lambda light chain ratio: 1.4 (ref 0.26–1.65)
Lambda free light chains: 36.07 mg/L — ABNORMAL HIGH (ref 5.71–26.30)

## 2015-08-01 LAB — PHOSPHORUS: PHOSPHORUS: 1 mg/dL — AB (ref 2.5–4.6)

## 2015-08-01 LAB — URINE CULTURE: Culture: 7000

## 2015-08-01 LAB — PREALBUMIN: Prealbumin: 12.8 mg/dL — ABNORMAL LOW (ref 18–38)

## 2015-08-01 LAB — VITAMIN D 25 HYDROXY (VIT D DEFICIENCY, FRACTURES): VIT D 25 HYDROXY: 11.5 ng/mL — AB (ref 30.0–100.0)

## 2015-08-01 LAB — WET PREP  (BD AFFIRM) (~~LOC~~): WET PREP (BD AFFIRM): NEGATIVE

## 2015-08-01 LAB — MAGNESIUM: Magnesium: 1.4 mg/dL — ABNORMAL LOW (ref 1.7–2.4)

## 2015-08-01 LAB — CALCIUM, IONIZED: Calcium, Ionized, Serum: 7.1 mg/dL — ABNORMAL HIGH (ref 4.5–5.6)

## 2015-08-01 MED ORDER — CALCITONIN (SALMON) 200 UNIT/ML IJ SOLN
272.0000 [IU] | Freq: Two times a day (BID) | INTRAMUSCULAR | Status: DC
  Filled 2015-08-01: qty 1.36

## 2015-08-01 MED ORDER — POTASSIUM PHOSPHATES 15 MMOLE/5ML IV SOLN
40.0000 meq | Freq: Once | INTRAVENOUS | Status: AC
  Administered 2015-08-01: 40 meq via INTRAVENOUS
  Filled 2015-08-01: qty 9.09

## 2015-08-01 MED ORDER — MAGNESIUM SULFATE 2 GM/50ML IV SOLN
2.0000 g | Freq: Once | INTRAVENOUS | Status: AC
  Administered 2015-08-01: 2 g via INTRAVENOUS
  Filled 2015-08-01: qty 50

## 2015-08-01 MED ORDER — INFLUENZA VAC SPLIT QUAD 0.5 ML IM SUSY
0.5000 mL | PREFILLED_SYRINGE | INTRAMUSCULAR | Status: DC
  Filled 2015-08-01: qty 0.5

## 2015-08-01 MED ORDER — PAMIDRONATE DISODIUM 30 MG/10ML IV SOLN
60.0000 mg | Freq: Every day | INTRAVENOUS | Status: DC
  Administered 2015-08-01: 60 mg via INTRAVENOUS
  Filled 2015-08-01: qty 20

## 2015-08-01 MED ORDER — CALCITONIN (SALMON) 200 UNIT/ML IJ SOLN
272.0000 [IU] | Freq: Two times a day (BID) | INTRAMUSCULAR | Status: AC
  Administered 2015-08-01 – 2015-08-02 (×3): 272 [IU] via INTRAMUSCULAR
  Filled 2015-08-01 (×3): qty 1.36

## 2015-08-01 MED ORDER — SODIUM CHLORIDE 0.9 % IV SOLN: 60.0000 mg | Freq: Every day | INTRAVENOUS | Status: DC

## 2015-08-01 MED ORDER — POTASSIUM CHLORIDE CRYS ER 20 MEQ PO TBCR
40.0000 meq | EXTENDED_RELEASE_TABLET | Freq: Two times a day (BID) | ORAL | Status: DC
  Administered 2015-08-01: 40 meq via ORAL
  Filled 2015-08-01: qty 2

## 2015-08-01 MED ORDER — AMLODIPINE BESYLATE 10 MG PO TABS
10.0000 mg | ORAL_TABLET | Freq: Every day | ORAL | Status: DC
  Administered 2015-08-01 – 2015-08-04 (×4): 10 mg via ORAL
  Filled 2015-08-01 (×5): qty 1

## 2015-08-01 NOTE — Progress Notes (Signed)
Initial Nutrition Assessment  DOCUMENTATION CODES:   Not applicable  INTERVENTION:    Ensure Enlive po BID, each supplement provides 350 kcal and 20 grams of protein  NUTRITION DIAGNOSIS:   Inadequate oral intake related to  (decreased appetite) as evidenced by per patient/family report  GOAL:   Patient will meet greater than or equal to 90% of their needs  MONITOR:   PO intake, Supplement acceptance, Labs, Weight trends, I & O's  REASON FOR ASSESSMENT:   Consult Assessment of nutrition requirement/status  ASSESSMENT:   69 y.o. Female with a PMH of HTN who presented to the [facility] ED following a fall and brief episode of altered mental status. Patient was found down in a state of altered mental status at home by her daughter. EMS was called and patients mental status had improved and she was alert and oriented by arrival. Patient states that the fall occurred when she felt her right knee give out. She also denies dizziness, weakness, palpatations or loss of consciousness prior to or after the fall. Additionally, she endorses a 3-4 day history of decreased appetite, fatigue and polydipsia.  Pt sleeping upon RD visit.  Spoke with pt's daughter at bedside.  Reports pt's appetite is doing a little better.  Ate some of her breakfast this AM.  Has Ensure Enlive supplements on her tray table which she's been drinking.  Daughter reports pt's weight has been stable.  RD unable to complete Nutrition Focused Physical Exam at this time.  Pt with blanket on up to chin covering most of body.  Diet Order:  Diet Heart Room service appropriate?: Yes; Fluid consistency:: Thin  Skin:  Reviewed, no issues  Last BM:  10/6  Height:   Ht Readings from Last 1 Encounters:  07/31/15 5\' 5"  (1.651 m)    Weight:   Wt Readings from Last 1 Encounters:  07/31/15 135 lb 9.3 oz (61.5 kg)    Ideal Body Weight:  56.8 kg  BMI:  Body mass index is 22.56 kg/(m^2).  Estimated Nutritional  Needs:   Kcal:  1550-1750  Protein:  75-85 gm  Fluid:  1.5-1.7 L  EDUCATION NEEDS:   No education needs identified at this time  Arthur Holms, RD, LDN Pager #: [phone] After-Hours Pager #: [phone]

## 2015-08-01 NOTE — Progress Notes (Signed)
Rabbani MD made aware of critical phosphorus level of 1.0 and pt showing some Vtach on the monitor around 1pm today. MD to address.

## 2015-08-01 NOTE — Progress Notes (Signed)
Subjective:  Pt seen and examined in PM. No acute events overnight. She is slow to respond to questions per family. She denies bone pain, constipation, abdominal pain, or history of kidney stones. Per daughter she denies weight loss. She has history of uterine prolapse and was told many years ago she may have bleeding as a result. She is unsure when she had pap smear in the past.      Objective: Vital signs in last 24 hours: Filed Vitals:   07/31/15 1630 07/31/15 1814 07/31/15 2039 08/01/15 0442  BP: 158/91 166/107 164/94 169/95  Pulse: 105 112 104 98  Temp:  97.6 F (36.4 C) 98.3 F (36.8 C) 98.3 F (36.8 C)  TempSrc:  Oral Oral Oral  Resp: 19 18 19 20   Height:  5\' 5"  (1.651 m)    Weight:  135 lb 3.2 oz (61.326 kg) 135 lb 9.3 oz (61.5 kg)   SpO2: 95% 98% 100% 97%   Weight change:   Intake/Output Summary (Last 24 hours) at 08/01/15 1130 Last data filed at 08/01/15 3734  Gross per 24 hour  Intake 1156.67 ml  Output    320 ml  Net 836.67 ml   PHYSICAL EXAMINATION:  General: NAD Heart: Tachycardic with normal rhythm    Lungs: Clear to auscultation bilaterally with no wheezing, ronchi, or rales  Abdomen: Soft, non-tender, non-distended, normal BS Extremities: No edema  Skin: No rashes  Neuro: Slow to respond to questions   Lab Results: Basic Metabolic Panel:  Recent Labs Lab 07/31/15 1519 08/01/15 0536  NA 139 136  K 3.2* 2.8*  CL 97* 96*  CO2 26 26  GLUCOSE 109* 106*  BUN 12 9  CREATININE 1.01* 0.89  CALCIUM 14.5* 12.0*   Liver Function Tests:  Recent Labs Lab 07/31/15 1519 08/01/15 0536  AST 35 37  ALT 16 17  ALKPHOS 71 65  BILITOT 0.8 0.7  PROT 9.3* 7.8  ALBUMIN 3.5 2.9*   CBC:  Recent Labs Lab 07/31/15 1519 08/01/15 0536  WBC 10.8* 10.8*  NEUTROABS 9.3*  --   HGB 14.7 11.7*  HCT 42.9 34.9*  MCV 89.0 88.8  PLT 505* 558*   Thyroid Function Tests:  Recent Labs Lab 07/31/15 1927  TSH 0.422    Urinalysis:  Recent Labs Lab  07/31/15 1842  COLORURINE AMBER*  LABSPEC 1.015  PHURINE 6.0  GLUCOSEU NEGATIVE  HGBUR LARGE*  BILIRUBINUR NEGATIVE  KETONESUR 15*  PROTEINUR 100*  UROBILINOGEN 1.0  NITRITE NEGATIVE  LEUKOCYTESUR NEGATIVE    Micro Results: Recent Results (from the past 240 hour(s))  Urine culture     Status: None (Preliminary result)   Collection Time: 07/31/15  6:42 PM  Result Value Ref Range Status   Specimen Description URINE, CATHETERIZED  Final   Special Requests NONE  Final   Culture CULTURE REINCUBATED FOR BETTER GROWTH  Final   Report Status PENDING  Incomplete   Studies/Results: Dg Chest 2 View  07/31/2015   CLINICAL DATA:  [place] this morning, hypertension, weakness in legs for 2 months  EXAM: CHEST  2 VIEW  COMPARISON:  None  FINDINGS: Normal heart size, mediastinal contours and pulmonary vascularity.  Calcification and mild tortuosity of thoracic aorta.  Mild bibasilar atelectasis.  Lungs otherwise clear.  No pleural effusion or pneumothorax.  Bones demineralized.  IMPRESSION: Mild bibasilar atelectasis.   Electronically Signed   By: Lavonia Dana M.D.   On: 07/31/2015 16:00   Ct Head Wo Contrast  07/31/2015   CLINICAL  DATA:  69YOF right knee gave out on her today and she lowered herself to the floor. Per EMS pts family stated that pt has been slow to respond today and that she has a Possible right side facial droop. EMS noted foul smelling urine  EXAM: CT HEAD WITHOUT CONTRAST  TECHNIQUE: Contiguous axial images were obtained from the base of the skull through the vertex without intravenous contrast.  COMPARISON:  None.  FINDINGS: Probable retention cysts or polyps in the maxillary sinuses, incompletely visualized. Atherosclerotic and physiologic intracranial calcifications. Early right basal ganglia mineralization. Diffuse parenchymal atrophy. Patchy areas of hypoattenuation in deep and periventricular white matter bilaterally. Negative for acute intracranial hemorrhage, mass lesion, acute  infarction, midline shift, or mass-effect. Acute infarct may be inapparent on noncontrast CT. Ventricles and sulci symmetric. Bone windows demonstrate no focal lesion.  IMPRESSION: 1. Negative for bleed or other acute intracranial process. 2. Atrophy and nonspecific white matter changes.   Electronically Signed   By: Lucrezia Europe M.D.   On: 07/31/2015 16:14   Dg Knee Complete 4 Views Right  07/31/2015   CLINICAL DATA:  Acute right knee pain after falling this morning. Initial encounter.  EXAM: RIGHT KNEE - COMPLETE 4+ VIEW  COMPARISON:  None.  FINDINGS: There is no evidence of fracture, dislocation, or joint effusion. There is no evidence of arthropathy or other focal bone abnormality. Soft tissues are unremarkable.  IMPRESSION: Normal right knee.   Electronically Signed   By: Marijo Conception, M.D.   On: 07/31/2015 16:01   Medications: I have reviewed the patient's current medications. Scheduled Meds: . amLODipine  10 mg Oral Daily  . calcitonin  272 Units Intramuscular BID  . enoxaparin (LOVENOX) injection  40 mg Subcutaneous Q24H  . [START ON 08/02/2015] Influenza vac split quadrivalent PF  0.5 mL Intramuscular Tomorrow-1000  . pamidronate  60 mg Intravenous Daily  . potassium chloride  40 mEq Oral BID   Continuous Infusions: . sodium chloride 200 mL/hr at 07/31/15 2013   PRN Meds:.acetaminophen **OR** acetaminophen Assessment/Plan:  Symptomatic Hypercalcemia - Pt still with slow mentation. Corrected Calcium 12.9 improved from 14.9 s/p 2 doses of calcitonin. Etiology unclear, possibly primary hyperparathyroidism due to adenoma vs vitamin D deficiency (level 11.5) vs malignancy. Awaiting PTH level to determine further work-up.  -Decrease NS 200 mL/hr to 150 ML/hr, continue with goal urine output 100-150 cc/hr with close monitoring for volume overload -Consider IV lasix once volume replete  -Continue calcitonin IM for 3 additional doses -Give  IV pamidronate 60 mg once  -Obtain phosphorous  level and smear review  -Follow-up PTH, PTH-related protein, 1,25 OH vitamin D level, and SPEP -Monitor CMP -Obtain vaginal [place] and perform pelvic examianation with pap smear testing in setting of vaginal bleeding   Hypokalemia - K 2.8 this AM. Etiology most likely due to poor PO intake vs hypomagnesemia.   -Replete with kdur 40 mEQ x 2  -Obtain magnesium level  -Monitor CMP   Asymptomatic Vtach - Pt tachycardic to 107 with evidence of vtach on telemetry. Etiology most likely due to electrolyte abnormalities (K, phos, calcium, magnesium).  -Continue to monitor on telemetry  -Obtain phosphorus and magnesium level  -Replete potassium, phosphorus, and magnesium as indicated  -Monitor BMP, phosphorus, and Mg level    Vaginal Bleeding in setting of Uterine Prolpase- Pt reports vaginal spotting with stable Hg 11.7 in setting of hemodilution from NS administration. Will need to rule out endometrial cancer.  -Obtain vaginal [place] and perform pelvic  examianation with pap smear testing  -Pt needs follow-up with gynecology for options for uterine prolapse   Hypertension - Currently mildly hypertensive. Pt not taking home chlorthalidone 100 mg daily.  -BP goal <150/90  -Continue amlodipine 5 mg daily    Hyperlipidemia - Lipid panel on 01/14/14 with LDL 151. Pt was started on pravastatin 20 mg daily in March. -Repeat lipid panel to assess for response to recently started pravastatin in March -Adjust home statin based on lipid results   Diet: Regular DVT PPx: Lovenox  Code: Full    Dispo: Disposition is deferred at this time, awaiting improvement of current medical problems.  Anticipated discharge in approximately 2-5  day(s).   The patient does not have a current PCP (No primary care provider on file.) and does need an [facility] hospital follow-up appointment after discharge.  The patient does have transportation limitations that hinder transportation to clinic appointments.  .Services Needed at time  of discharge: Y = Yes, Blank = No PT:   OT:   RN:   Equipment:   Other:     LOS: 1 day   Juluis Mire, MD 08/01/2015, 11:30 AM

## 2015-08-01 NOTE — Progress Notes (Signed)
Subjective: O/N: Patient found to have vaginal bleeding.  Subjective: Patient seen this AM and reports that overall she is functioning at her baseline. She denies headache, confusion, SOB, nausea, vomiting and diarrhea.   Patient was asked about her vaginal bleeding. She states that it first began between 6 months and 1 year ago. She describes the bleeding as intermittent and spotty. She denies pain or discharge associated with the bleeding  Additionally, patient a chart review revealed a 15 lb weight loss since her last ED visit in March 2016.   Objective: Vital signs in last 24 hours: Temp (24hrs), Avg:97.9 F (36.6 C), Min:97.4 F (36.3 C), Max:98.3 F (36.8 C)    Filed Vitals:   07/31/15 1630 07/31/15 1814 07/31/15 2039 08/01/15 0442  BP: 158/91 166/107 164/94 169/95  Pulse: 105 112 104 98  Temp:  97.6 F (36.4 C) 98.3 F (36.8 C) 98.3 F (36.8 C)  TempSrc:  Oral Oral Oral  Resp: _0 Height:  5' 5" (1.651 m)    Weight:  61.326 kg (135 lb 3.2 oz) 61.5 kg (135 lb 9.3 oz)   SpO2: 95% 98% 100% 97%   Weight change:   Intake/Output Summary (Last 24 hours) at 08/01/15 8502 Last data filed at 08/01/15 7741  Gross per 24 hour  Intake 1156.67 ml  Output    320 ml  Net 836.67 ml   Physical Exam  Constitutional:  Thin elderly female laying in bed in NAD   HENT:  Head: Normocephalic.  Right Ear: External ear normal.  Left Ear: External ear normal.  Mouth/Throat: No oropharyngeal exudate.  Eyes: Conjunctivae and EOM are normal. No scleral icterus.  Neck: Normal range of motion. No thyromegaly present.  Cardiovascular: Normal rate and regular rhythm.  Exam reveals no gallop and no friction rub.   No murmur heard. Pulmonary/Chest: Effort normal and breath sounds normal. She has no wheezes.  Abdominal: Soft. Bowel sounds are normal. She exhibits no distension.  Genitourinary:  Deferred until this afternoon  Musculoskeletal: Normal range of motion. She exhibits  no edema.  Neurological:  Patient is alert and oriented to person and place but not time . Able to name president   Skin: Skin is warm and dry.  Psychiatric: She has a normal mood and affect.    Lab Results: Results for orders placed or performed during the hospital encounter of 07/31/15 (from the past 48 hour(s))  I-stat troponin, ED     Status: None   Collection Time: 07/31/15  2:41 PM  Result Value Ref Range   Troponin i, poc 0.06 0.00 - 0.08 ng/mL   Comment 3            Comment: Due to the release kinetics of cTnI, a negative result within the first hours of the onset of symptoms does not rule out myocardial infarction with certainty. If myocardial infarction is still suspected, repeat the test at appropriate intervals.   Comprehensive metabolic panel     Status: Abnormal   Collection Time: 07/31/15  3:19 PM  Result Value Ref Range   Sodium 139 135 - 145 mmol/L   Potassium 3.2 (L) 3.5 - 5.1 mmol/L   Chloride 97 (L) 101 - 111 mmol/L   CO2 26 22 - 32 mmol/L   Glucose, Bld 109 (H) 65 - 99 mg/dL   BUN 12 6 - 20 mg/dL   Creatinine, Ser 1.01 (H) 0.44 - 1.00 mg/dL   Calcium 14.5 (HH) 8.9 - 10.3 mg/dL  Comment: CRITICAL RESULT CALLED TO, READ BACK BY AND VERIFIED WITH: A MCKINNON,RN 1604 07/31/15 D BRADLEY    Total Protein 9.3 (H) 6.5 - 8.1 g/dL   Albumin 3.5 3.5 - 5.0 g/dL   AST 35 15 - 41 U/L   ALT 16 14 - 54 U/L   Alkaline Phosphatase 71 38 - 126 U/L   Total Bilirubin 0.8 0.3 - 1.2 mg/dL   GFR calc non Af Amer 55 (L) >60 mL/min   GFR calc Af Amer >60 >60 mL/min    Comment: (NOTE) The eGFR has been calculated using the CKD EPI equation. This calculation has not been validated in all clinical situations. eGFR's persistently <60 mL/min signify possible Chronic Kidney Disease.    Anion gap 16 (H) 5 - 15  CBC with Differential/Platelet     Status: Abnormal   Collection Time: 07/31/15  3:19 PM  Result Value Ref Range   WBC 10.8 (H) 4.0 - 10.5 K/uL   RBC 4.82 3.87 -  5.11 MIL/uL   Hemoglobin 14.7 12.0 - 15.0 g/dL   HCT 42.9 36.0 - 46.0 %   MCV 89.0 78.0 - 100.0 fL   MCH 30.5 26.0 - 34.0 pg   MCHC 34.3 30.0 - 36.0 g/dL   RDW 15.3 11.5 - 15.5 %   Platelets 505 (H) 150 - 400 K/uL   Neutrophils Relative % 87 %   Neutro Abs 9.3 (H) 1.7 - 7.7 K/uL   Lymphocytes Relative 7 %   Lymphs Abs 0.8 0.7 - 4.0 K/uL   Monocytes Relative 6 %   Monocytes Absolute 0.7 0.1 - 1.0 K/uL   Eosinophils Relative 0 %   Eosinophils Absolute 0.0 0.0 - 0.7 K/uL   Basophils Relative 0 %   Basophils Absolute 0.0 0.0 - 0.1 K/uL  Urine culture     Status: None (Preliminary result)   Collection Time: 07/31/15  6:42 PM  Result Value Ref Range   Specimen Description URINE, CATHETERIZED    Special Requests NONE    Culture CULTURE REINCUBATED FOR BETTER GROWTH    Report Status PENDING   Urinalysis, Routine w reflex microscopic (not at [facility])     Status: Abnormal   Collection Time: 07/31/15  6:42 PM  Result Value Ref Range   Color, Urine AMBER (A) YELLOW    Comment: BIOCHEMICALS MAY BE AFFECTED BY COLOR   APPearance CLOUDY (A) CLEAR   Specific Gravity, Urine 1.015 1.005 - 1.030   pH 6.0 5.0 - 8.0   Glucose, UA NEGATIVE NEGATIVE mg/dL   Hgb urine dipstick LARGE (A) NEGATIVE   Bilirubin Urine NEGATIVE NEGATIVE   Ketones, ur 15 (A) NEGATIVE mg/dL   Protein, ur 100 (A) NEGATIVE mg/dL   Urobilinogen, UA 1.0 0.0 - 1.0 mg/dL   Nitrite NEGATIVE NEGATIVE   Leukocytes, UA NEGATIVE NEGATIVE  Urine microscopic-add on     Status: None   Collection Time: 07/31/15  6:42 PM  Result Value Ref Range   Squamous Epithelial / LPF RARE RARE   WBC, UA 3-6 <3 WBC/hpf   RBC / HPF TOO NUMEROUS TO COUNT <3 RBC/hpf  TSH     Status: None   Collection Time: 07/31/15  7:27 PM  Result Value Ref Range   TSH 0.422 0.350 - 4.500 uIU/mL  Vit D  25 hydroxy (routine osteoporosis monitoring)     Status: Abnormal   Collection Time: 07/31/15  7:27 PM  Result Value Ref Range   Vit D, 25-Hydroxy 11.5 (L) 30.0  -  100.0 ng/mL    Comment: (NOTE) Vitamin D deficiency has been defined by the [place] practice guideline as a level of serum 25-OH vitamin D less than 20 ng/mL (1,2). The Endocrine Society went on to further define vitamin D insufficiency as a level between 21 and 29 ng/mL (2). 1. IOM (Institute of Medicine). 2010. Dietary reference   intakes for calcium and D. [place]: The   [employer]. 2. Holick MF, Binkley Lavonia, Bischoff-Ferrari HA, et al.   Evaluation, treatment, and prevention of vitamin D   deficiency: an Endocrine Society clinical practice   guideline. JCEM. 2011 Jul; 96(7):1911-30. Performed At: [facility] [place], [place] [id number] Lindon Romp MD [id number]   Comprehensive metabolic panel     Status: Abnormal   Collection Time: 08/01/15  5:36 AM  Result Value Ref Range   Sodium 136 135 - 145 mmol/L   Potassium 2.8 (L) 3.5 - 5.1 mmol/L   Chloride 96 (L) 101 - 111 mmol/L   CO2 26 22 - 32 mmol/L   Glucose, Bld 106 (H) 65 - 99 mg/dL   BUN 9 6 - 20 mg/dL   Creatinine, Ser 0.89 0.44 - 1.00 mg/dL   Calcium 12.0 (H) 8.9 - 10.3 mg/dL   Total Protein 7.8 6.5 - 8.1 g/dL   Albumin 2.9 (L) 3.5 - 5.0 g/dL   AST 37 15 - 41 U/L   ALT 17 14 - 54 U/L   Alkaline Phosphatase 65 38 - 126 U/L   Total Bilirubin 0.7 0.3 - 1.2 mg/dL   GFR calc non Af Amer >60 >60 mL/min   GFR calc Af Amer >60 >60 mL/min    Comment: (NOTE) The eGFR has been calculated using the CKD EPI equation. This calculation has not been validated in all clinical situations. eGFR's persistently <60 mL/min signify possible Chronic Kidney Disease.    Anion gap 14 5 - 15  CBC     Status: Abnormal   Collection Time: 08/01/15  5:36 AM  Result Value Ref Range   WBC 10.8 (H) 4.0 - 10.5 K/uL    Comment: REPEATED TO VERIFY   RBC 3.93 3.87 - 5.11 MIL/uL   Hemoglobin 11.7 (L) 12.0 - 15.0 g/dL    Comment: SPECIMEN CHECKED FOR  CLOTS REPEATED TO VERIFY    HCT 34.9 (L) 36.0 - 46.0 %   MCV 88.8 78.0 - 100.0 fL   MCH 29.8 26.0 - 34.0 pg   MCHC 33.5 30.0 - 36.0 g/dL   RDW 15.4 11.5 - 15.5 %   Platelets 558 (H) 150 - 400 K/uL    Comment: REPEATED TO VERIFY  Prealbumin     Status: Abnormal   Collection Time: 08/01/15 10:14 AM  Result Value Ref Range   Prealbumin 12.8 (L) 18 - 38 mg/dL   Micro Results: No results found for this or any previous visit (from the past 240 hour(s)). Studies/Results: Dg Chest 2 View  07/31/2015   CLINICAL DATA:  [place] this morning, hypertension, weakness in legs for 2 months  EXAM: CHEST  2 VIEW  COMPARISON:  None  FINDINGS: Normal heart size, mediastinal contours and pulmonary vascularity.  Calcification and mild tortuosity of thoracic aorta.  Mild bibasilar atelectasis.  Lungs otherwise clear.  No pleural effusion or pneumothorax.  Bones demineralized.  IMPRESSION: Mild bibasilar atelectasis.   Electronically Signed   By: Lavonia Dana M.D.   On: 07/31/2015 16:00   Ct Head  Wo Contrast  07/31/2015   CLINICAL DATA:  69YOF right knee gave out on her today and she lowered herself to the floor. Per EMS pts family stated that pt has been slow to respond today and that she has a Possible right side facial droop. EMS noted foul smelling urine  EXAM: CT HEAD WITHOUT CONTRAST  TECHNIQUE: Contiguous axial images were obtained from the base of the skull through the vertex without intravenous contrast.  COMPARISON:  None.  FINDINGS: Probable retention cysts or polyps in the maxillary sinuses, incompletely visualized. Atherosclerotic and physiologic intracranial calcifications. Early right basal ganglia mineralization. Diffuse parenchymal atrophy. Patchy areas of hypoattenuation in deep and periventricular white matter bilaterally. Negative for acute intracranial hemorrhage, mass lesion, acute infarction, midline shift, or mass-effect. Acute infarct may be inapparent on noncontrast CT. Ventricles and sulci  symmetric. Bone windows demonstrate no focal lesion.  IMPRESSION: 1. Negative for bleed or other acute intracranial process. 2. Atrophy and nonspecific white matter changes.   Electronically Signed   By: Lucrezia Europe M.D.   On: 07/31/2015 16:14   Dg Knee Complete 4 Views Right  07/31/2015   CLINICAL DATA:  Acute right knee pain after falling this morning. Initial encounter.  EXAM: RIGHT KNEE - COMPLETE 4+ VIEW  COMPARISON:  None.  FINDINGS: There is no evidence of fracture, dislocation, or joint effusion. There is no evidence of arthropathy or other focal bone abnormality. Soft tissues are unremarkable.  IMPRESSION: Normal right knee.   Electronically Signed   By: Marijo Conception, M.D.   On: 07/31/2015 16:01   Medications: I have reviewed the patient's current medications. Scheduled Meds: . amLODipine  5 mg Oral Daily  . calcitonin  272 Units Intramuscular BID  . enoxaparin (LOVENOX) injection  40 mg Subcutaneous Q24H  . [START ON 08/02/2015] Influenza vac split quadrivalent PF  0.5 mL Intramuscular Tomorrow-1000   Continuous Infusions: . sodium chloride 200 mL/hr at 07/31/15 2013   PRN Meds:.acetaminophen **OR** acetaminophen Assessment/Plan: Active Problems:   Hypercalcemia   Essential hypertension   Hypokalemia   Acute encephalopathy  Severe Symptomatic Hypercalcemia: Given patient's age concerning for hypercalcemia of malignancy, multiple myeloma, hyperparathyroidism. History of weight loss and vaginal bleeding are more concerning for malignancy, making this the leading diagnosis. Calcium downtrened from 14.5 to 12 today following IVF and Calcitonin. Start patient on pamidronate 60 IV daily and d/c Calcitonin tomorrow due to tachyphylaxis. TSH within normal limits, ruling out thyroid etiology. Vit D 25 OH low, begin supplementation   -NS 200 cc/hr, titrate to 100-150 cc urine output per hour  -Start IV pamindronate 60 mg QD (First dose 10/7) -Calcitonin 272 units (4 units/kg) BID,  discontinue onm 10/8 -Repeat CMET, monitor Mg and Phos  -TSH=0.422 *(normal) -PTH and ionized calcium (for primary hyperparathyroidism) pending -PTH-related peptide (for humoral hypercalcemia of malignancy if elevated): Pending -Vitamin D 25-OH: 12.5 (L) -Vitamin D 1,25-dihydroxy: Pending -SPEP and light chains to rule out multiple myeloma: Pending  -Transvaginal US 10/7 to evaluate for gynecological malignancy -Pelvic exam w/ pap smear.    Acute Encephalopathy: Resolved. Likely 2/2 to hypercalcemia. Also included in differential are UTI, stroke and IC bleed. Negative CT and normal neuro exam virtually exclude stoke or IC bleed. Urinalysis not indicative of UTI.   -Urinalysis: Frank blood, large Hg, Ketones 15, Leukocyte negative, Leukocyte esterase negative -Urine culture pending -Treat hypercalcemia as above  HTN: 149/109 on admission. Continued hypertension today. Patient is on amlodipine at home.   -Continue amlodipine 5 mg  daily  Hypokalemia: K 3.2 on admission. K 2.8 today  -Kdur 40 mEq BID to replete -Repeat BMP tomorrow morning  Malnutrition: Patient has had a 15 lb weight loss since March. Albumin low at 2.9. Concerning for malignancy. Nutrition consulted and made recs (see below)  -Ensure Enlive BID    DVT/PE ppx: Lovenox SQ QD  Dispo: Disposition is deferred at this time, awaiting improvement of current medical problems. Anticipated discharge in approximately 1-2 day(s).    This is a Careers information officer Note.  The care of the patient was discussed with Dr. Daryll Drown and the assessment and plan formulated with their assistance.  Please see their attached note for official documentation of the daily encounter.   LOS: 1 day   Florentina Addison, Med Student 08/01/2015, 6:13 AM

## 2015-08-01 NOTE — Progress Notes (Signed)
Patient has prolapse of the uterus. MD on call notified. MD present at bedside. Will continue to monitor.

## 2015-08-01 NOTE — Progress Notes (Signed)
Utilization review completed. Keren Alverio, RN, BSN. 

## 2015-08-02 ENCOUNTER — Inpatient Hospital Stay (HOSPITAL_COMMUNITY): Payer: Medicare PPO

## 2015-08-02 ENCOUNTER — Encounter (HOSPITAL_COMMUNITY): Payer: Self-pay | Admitting: Radiology

## 2015-08-02 DIAGNOSIS — N950 Postmenopausal bleeding: Secondary | ICD-10-CM | POA: Insufficient documentation

## 2015-08-02 DIAGNOSIS — N814 Uterovaginal prolapse, unspecified: Secondary | ICD-10-CM | POA: Insufficient documentation

## 2015-08-02 DIAGNOSIS — E559 Vitamin D deficiency, unspecified: Secondary | ICD-10-CM

## 2015-08-02 LAB — COMPREHENSIVE METABOLIC PANEL
ALBUMIN: 2.6 g/dL — AB (ref 3.5–5.0)
ALK PHOS: 60 U/L (ref 38–126)
ALT: 19 U/L (ref 14–54)
ANION GAP: 10 (ref 5–15)
AST: 35 U/L (ref 15–41)
BILIRUBIN TOTAL: 0.6 mg/dL (ref 0.3–1.2)
BUN: <5 mg/dL — ABNORMAL LOW (ref 6–20)
CALCIUM: 10.7 mg/dL — AB (ref 8.9–10.3)
CO2: 28 mmol/L (ref 22–32)
Chloride: 100 mmol/L — ABNORMAL LOW (ref 101–111)
Creatinine, Ser: 0.67 mg/dL (ref 0.44–1.00)
GFR calc Af Amer: >60 mL/min (ref >60)
GLUCOSE: 99 mg/dL (ref 65–99)
POTASSIUM: 2.9 mmol/L — AB (ref 3.5–5.1)
Sodium: 138 mmol/L (ref 135–145)
TOTAL PROTEIN: 6.9 g/dL (ref 6.5–8.1)

## 2015-08-02 LAB — PHOSPHORUS: PHOSPHORUS: 1.2 mg/dL — AB (ref 2.5–4.6)

## 2015-08-02 LAB — TECHNOLOGIST SMEAR REVIEW

## 2015-08-02 LAB — LIPID PANEL
Cholesterol: 203 mg/dL — ABNORMAL HIGH (ref 0–200)
HDL: 42 mg/dL (ref >40)
LDL CALC: 140 mg/dL — AB (ref 0–99)
TRIGLYCERIDES: 105 mg/dL (ref <150)
Total CHOL/HDL Ratio: 4.8 RATIO
VLDL: 21 mg/dL (ref 0–40)

## 2015-08-02 LAB — CBC
HEMATOCRIT: 33.1 % — AB (ref 36.0–46.0)
Hemoglobin: 11.1 g/dL — ABNORMAL LOW (ref 12.0–15.0)
MCH: 29.8 pg (ref 26.0–34.0)
MCHC: 33.5 g/dL (ref 30.0–36.0)
MCV: 89 fL (ref 78.0–100.0)
PLATELETS: 511 10*3/uL — AB (ref 150–400)
RBC: 3.72 MIL/uL — ABNORMAL LOW (ref 3.87–5.11)
RDW: 15.6 % — AB (ref 11.5–15.5)
WBC: 12.7 10*3/uL — AB (ref 4.0–10.5)

## 2015-08-02 LAB — MAGNESIUM: Magnesium: 1.4 mg/dL — ABNORMAL LOW (ref 1.7–2.4)

## 2015-08-02 MED ORDER — IOHEXOL 300 MG/ML  SOLN
100.0000 mL | Freq: Once | INTRAMUSCULAR | Status: AC | PRN
  Administered 2015-08-02: 100 mL via INTRAVENOUS

## 2015-08-02 MED ORDER — POTASSIUM CHLORIDE CRYS ER 20 MEQ PO TBCR
40.0000 meq | EXTENDED_RELEASE_TABLET | Freq: Two times a day (BID) | ORAL | Status: AC
  Administered 2015-08-02 (×2): 40 meq via ORAL
  Filled 2015-08-02 (×2): qty 2

## 2015-08-02 MED ORDER — MAGNESIUM SULFATE 2 GM/50ML IV SOLN
2.0000 g | Freq: Once | INTRAVENOUS | Status: AC
  Administered 2015-08-02: 2 g via INTRAVENOUS
  Filled 2015-08-02: qty 50

## 2015-08-02 MED ORDER — K PHOS MONO-SOD PHOS DI & MONO 155-852-130 MG PO TABS
750.0000 mg | ORAL_TABLET | Freq: Three times a day (TID) | ORAL | Status: AC
  Administered 2015-08-02 (×3): 750 mg via ORAL
  Filled 2015-08-02 (×4): qty 3

## 2015-08-02 NOTE — Progress Notes (Signed)
Subjective:  Patient seen and examined today. She admits to fatigue and decreased appetite. She denies any shortness of breath or leg swelling.   Objective: Vital signs in last 24 hours: Filed Vitals:   08/01/15 1800 08/01/15 2031 08/02/15 0412 08/02/15 0916  BP: 139/80 152/89 161/98 157/88  Pulse: 102 104 97 105  Temp: 99.1 F (37.3 C) 98 F (36.7 C) 98.2 F (36.8 C) 98.9 F (37.2 C)  TempSrc: Oral Oral Oral Oral  Resp: 20 18 16 18   Height:      Weight:  132 lb (59.875 kg)    SpO2: 99% 97% 96% 100%   General: Vital signs reviewed. Patient is elderly, well-developed and well-nourished, in no acute distress and cooperative with exam.  Cardiovascular: Tachycardic, regular rhythm, S1 normal, S2 normal. Pulmonary/Chest: Clear to auscultation bilaterally, no wheezes, rales, or rhonchi. Abdominal: Soft, non-tender, non-distended, BS + GU: Stage IV uterine prolapse Extremities: No lower extremity edema bilaterally, pulses symmetric and intact bilaterally.  Skin: Warm, dry and intact. No rashes or erythema. Psychiatric: Normal mood and affect. Speech is slow and behavior is normal.    Weight change: -3 lb 3.2 oz (-1.452 kg)  Intake/Output Summary (Last 24 hours) at 08/02/15 1208 Last data filed at 08/02/15 0945  Gross per 24 hour  Intake 2780.76 ml  Output    301 ml  Net 2479.76 ml    Lab Results: Basic Metabolic Panel:  Recent Labs Lab 08/01/15 0536 08/01/15 1215 08/02/15 0520  NA 136  --  138  K 2.8*  --  2.9*  CL 96*  --  100*  CO2 26  --  28  GLUCOSE 106*  --  99  BUN 9  --  <5*  CREATININE 0.89  --  0.67  CALCIUM 12.0*  --  10.7*  MG  --  1.4* 1.4*  PHOS  --  1.0* 1.2*   Liver Function Tests:  Recent Labs Lab 08/01/15 0536 08/02/15 0520  AST 37 35  ALT 17 19  ALKPHOS 65 60  BILITOT 0.7 0.6  PROT 7.8 6.9  ALBUMIN 2.9* 2.6*   CBC:  Recent Labs Lab 07/31/15 1519 08/01/15 0536 08/02/15 0520  WBC 10.8* 10.8* 12.7*  NEUTROABS 9.3*  --   --    HGB 14.7 11.7* 11.1*  HCT 42.9 34.9* 33.1*  MCV 89.0 88.8 89.0  PLT 505* 558* 511*   Fasting Lipid Panel:  Recent Labs Lab 08/02/15 0520  CHOL 203*  HDL 42  LDLCALC 140*  TRIG 105  CHOLHDL 4.8   Thyroid Function Tests:  Recent Labs Lab 07/31/15 1927  TSH 0.422   Urinalysis:  Recent Labs Lab 07/31/15 1842  COLORURINE AMBER*  LABSPEC 1.015  PHURINE 6.0  GLUCOSEU NEGATIVE  HGBUR LARGE*  BILIRUBINUR NEGATIVE  KETONESUR 15*  PROTEINUR 100*  UROBILINOGEN 1.0  NITRITE NEGATIVE  LEUKOCYTESUR NEGATIVE    Micro Results: Recent Results (from the past 240 hour(s))  Urine culture     Status: None   Collection Time: 07/31/15  6:42 PM  Result Value Ref Range Status   Specimen Description URINE, CATHETERIZED  Final   Special Requests NONE  Final   Culture 7,000 COLONIES/mL INSIGNIFICANT GROWTH  Final   Report Status 08/01/2015 FINAL  Final   Studies/Results: Dg Chest 2 View  07/31/2015   CLINICAL DATA:  [place] this morning, hypertension, weakness in legs for 2 months  EXAM: CHEST  2 VIEW  COMPARISON:  None  FINDINGS: Normal heart size, mediastinal contours  and pulmonary vascularity.  Calcification and mild tortuosity of thoracic aorta.  Mild bibasilar atelectasis.  Lungs otherwise clear.  No pleural effusion or pneumothorax.  Bones demineralized.  IMPRESSION: Mild bibasilar atelectasis.   Electronically Signed   By: Lavonia Dana M.D.   On: 07/31/2015 16:00   Ct Head Wo Contrast  07/31/2015   CLINICAL DATA:  69YOF right knee gave out on her today and she lowered herself to the floor. Per EMS pts family stated that pt has been slow to respond today and that she has a Possible right side facial droop. EMS noted foul smelling urine  EXAM: CT HEAD WITHOUT CONTRAST  TECHNIQUE: Contiguous axial images were obtained from the base of the skull through the vertex without intravenous contrast.  COMPARISON:  None.  FINDINGS: Probable retention cysts or polyps in the maxillary sinuses,  incompletely visualized. Atherosclerotic and physiologic intracranial calcifications. Early right basal ganglia mineralization. Diffuse parenchymal atrophy. Patchy areas of hypoattenuation in deep and periventricular white matter bilaterally. Negative for acute intracranial hemorrhage, mass lesion, acute infarction, midline shift, or mass-effect. Acute infarct may be inapparent on noncontrast CT. Ventricles and sulci symmetric. Bone windows demonstrate no focal lesion.  IMPRESSION: 1. Negative for bleed or other acute intracranial process. 2. Atrophy and nonspecific white matter changes.   Electronically Signed   By: Lucrezia Europe M.D.   On: 07/31/2015 16:14   Dg Knee Complete 4 Views Right  07/31/2015   CLINICAL DATA:  Acute right knee pain after falling this morning. Initial encounter.  EXAM: RIGHT KNEE - COMPLETE 4+ VIEW  COMPARISON:  None.  FINDINGS: There is no evidence of fracture, dislocation, or joint effusion. There is no evidence of arthropathy or other focal bone abnormality. Soft tissues are unremarkable.  IMPRESSION: Normal right knee.   Electronically Signed   By: Marijo Conception, M.D.   On: 07/31/2015 16:01   Medications:  I have reviewed the patient's current medications. Prior to Admission:  Prescriptions prior to admission  Medication Sig Dispense Refill Last Dose  . amLODipine (NORVASC) 5 MG tablet Take 1 tablet (5 mg total) by mouth daily. 90 tablet 3 07/31/2015 at Unknown time  . chlorthalidone (HYGROTON) 25 MG tablet Take 4 tablets (100 mg total) by mouth daily. (Patient not taking: Reported on 07/31/2015) 45 tablet 5 Not Taking at Unknown time  . citalopram (CELEXA) 20 MG tablet Take 1 tablet (20 mg total) by mouth daily. (Patient not taking: Reported on 07/31/2015) 30 tablet 3 Not Taking at Unknown time  . pravastatin (PRAVACHOL) 20 MG tablet Take 1 tablet (20 mg total) by mouth daily. (Patient not taking: Reported on 07/31/2015) 30 tablet 3 Not Taking at Unknown time   Scheduled  Meds: . amLODipine  10 mg Oral Daily  . calcitonin  272 Units Intramuscular BID  . enoxaparin (LOVENOX) injection  40 mg Subcutaneous Q24H  . Influenza vac split quadrivalent PF  0.5 mL Intramuscular Tomorrow-1000  . phosphorus  750 mg Oral TID  . potassium chloride  40 mEq Oral BID   Continuous Infusions: . sodium chloride 150 mL/hr at 08/02/15 0936   PRN Meds:.acetaminophen **OR** acetaminophen Assessment/Plan: Principal Problem:   Hypercalcemia Active Problems:   Essential hypertension   Hypokalemia   Vitamin D deficiency  Severe Symptomatic Hypercalcemia: Corrected calcium 14.9 on admission which has trended down to 11.8 today. So far, work up has revealed low-normal PTH (15) indicating non-parathyroid hypercalcemia. TSH normal. Vitamin D level low at 11.5; therefore, hypercalcemia is not due  to vitamin D intoxication. SPEP showed no M spike and kappa/lambda light chain ration was normal, ruling out Multiple Myeloma. Yesterday, patient admitted to vaginal bleeding for the last 6 to 12 months and a 15 pound weight loss, giving rise to concern for uterine malignancy. PelvicTransvaginal ultrasound unattainable due to uterine prolapse, will get CT abdomen with contrast. Calcium has been improving while on normal saline infusion, Pamindronate 60 mg daily and calcitonin. Will discontinue calcitonin today.  -NS 150 cc/hr -Calcitonin 272 units (4 units/kg) BID, last dose tonight -Repeat BMET tomorrow am -PTH-related peptide pending (for humoral hypercalcemia of malignancy if elevated) -Vitamin D 1,25-dihydroxy pending (think lymphoma, granulomatous disease if elevated) -Pamidronate once weekly -CT abdomen/pelvis with contrast -Will need uterine biopsy (inpatient versus outpatient)  Acute Encephalopathy: Resolved, likely secondary to hypercalcemia.  -Treat hypercalcemia as above  HTN: 157/88 this morning. Patient is on amlodipine at home.  -Increase to amlodipine to 10 mg daily  yesterday, may need second agent -Continue to monitor  Hypokalemia: K 2.9 this morning. -Kdur 40 mEq BID x 2 -Repeat BMET tomorrow am  Hypophosphatemia: Phosphorus 1.2 this morning.  -KPhos 750 mg TID x 3 doses -Repeat phosphorus tomorrow am  Hypomagnesemia: Magnesium 1.4 this morning.  -Magnesium 2 grams IVPB -Repeat magnesium tomorrow am  DVT/PE ppx: Lovenox SQ QD  Dispo: Disposition is deferred at this time, awaiting improvement of current medical problems.  Anticipated discharge in approximately 2-3 day(s).   The patient does not have a current PCP (No primary care provider on file.) and does need an [facility] hospital follow-up appointment after discharge.  The patient does have transportation limitations that hinder transportation to clinic appointments.    LOS: 2 days   Osa Craver, DO PGY-1 Internal Medicine Resident Pager # [phone] 08/02/2015 12:08 PM

## 2015-08-03 ENCOUNTER — Inpatient Hospital Stay (HOSPITAL_COMMUNITY): Payer: Medicare PPO

## 2015-08-03 ENCOUNTER — Encounter (HOSPITAL_COMMUNITY): Payer: Self-pay | Admitting: Radiology

## 2015-08-03 LAB — BASIC METABOLIC PANEL
Anion gap: 11 (ref 5–15)
BUN: 5 mg/dL — AB (ref 6–20)
CALCIUM: 8.8 mg/dL — AB (ref 8.9–10.3)
CO2: 27 mmol/L (ref 22–32)
CREATININE: 0.77 mg/dL (ref 0.44–1.00)
Chloride: 99 mmol/L — ABNORMAL LOW (ref 101–111)
GFR calc Af Amer: >60 mL/min (ref >60)
GLUCOSE: 87 mg/dL (ref 65–99)
POTASSIUM: 2.6 mmol/L — AB (ref 3.5–5.1)
SODIUM: 137 mmol/L (ref 135–145)

## 2015-08-03 LAB — MAGNESIUM: Magnesium: 1.4 mg/dL — ABNORMAL LOW (ref 1.7–2.4)

## 2015-08-03 LAB — GLUCOSE, CAPILLARY: GLUCOSE-CAPILLARY: 100 mg/dL — AB (ref 65–99)

## 2015-08-03 LAB — PTH, INTACT AND CALCIUM
CALCIUM TOTAL (PTH): 14 mg/dL — AB (ref 8.7–10.3)
PTH: 15 pg/mL (ref 15–65)

## 2015-08-03 LAB — PHOSPHORUS: Phosphorus: 2.8 mg/dL (ref 2.5–4.6)

## 2015-08-03 MED ORDER — IOHEXOL 300 MG/ML  SOLN
100.0000 mL | Freq: Once | INTRAMUSCULAR | Status: AC | PRN
  Administered 2015-08-03: 100 mL via INTRAVENOUS

## 2015-08-03 MED ORDER — CARVEDILOL 3.125 MG PO TABS
3.1250 mg | ORAL_TABLET | Freq: Two times a day (BID) | ORAL | Status: DC
  Administered 2015-08-03 (×2): 3.125 mg via ORAL
  Filled 2015-08-03 (×4): qty 1

## 2015-08-03 MED ORDER — IOHEXOL 300 MG/ML  SOLN
25.0000 mL | INTRAMUSCULAR | Status: AC
  Administered 2015-08-03 (×2): 25 mL via ORAL

## 2015-08-03 MED ORDER — POTASSIUM CHLORIDE CRYS ER 20 MEQ PO TBCR
40.0000 meq | EXTENDED_RELEASE_TABLET | Freq: Three times a day (TID) | ORAL | Status: DC
  Administered 2015-08-03 (×2): 40 meq via ORAL
  Filled 2015-08-03 (×4): qty 2

## 2015-08-03 MED ORDER — MAGNESIUM SULFATE 2 GM/50ML IV SOLN
2.0000 g | Freq: Once | INTRAVENOUS | Status: AC
  Administered 2015-08-03: 2 g via INTRAVENOUS
  Filled 2015-08-03: qty 50

## 2015-08-03 NOTE — Evaluation (Signed)
Physical Therapy Evaluation Patient Details Name: Allison Chapman MRN: [id number] DOB: 1945/11/26 Today's Date: 08/03/2015   History of Present Illness  Pt admitted following a fall and brief episode of altered mental status. CT abdomen/pelvis showed   bilateral external iliac nodal masses worrisome for metastatic malignancy. Patient also with prolapsed uterus (outside of body).  Clinical Impression  Patient presents with generalized weakness limiting independence in mobility.  Patient will benefit from PT to increase independence and mobility to allow discharge to home with family.  At present, patient will need assistance for all mobility at discharge - discussed with family.       Follow Up Recommendations Home health PT;Supervision for mobility/OOB    Equipment Recommendations  Rolling walker with 5" wheels    Recommendations for Other Services       Precautions / Restrictions Precautions Precautions: Fall      Mobility  Bed Mobility Overal bed mobility: Needs Assistance Bed Mobility: Rolling;Sidelying to Sit Rolling: Min assist Sidelying to sit: Min assist          Transfers Overall transfer level: Needs assistance Equipment used: 1 person hand held assist Transfers: Sit to/from Stand Sit to Stand: Min assist         General transfer comment: patient transfered sit to/from stand and to/from [facility]   Ambulation/Gait Ambulation/Gait assistance: Min assist Ambulation Distance (Feet): 5 Feet Assistive device: 1 person hand held assist Gait Pattern/deviations: Step-to pattern;Decreased stride length;Leaning posteriorly        Stairs            Wheelchair Mobility    Modified Rankin (Stroke Patients Only)       Balance Overall balance assessment: Needs assistance Sitting-balance support: No upper extremity supported Sitting balance-Leahy Scale: Good     Standing balance support: Single extremity supported Standing balance-Leahy Scale:  Poor Standing balance comment: required assistance for balance                             Pertinent Vitals/Pain Pain Assessment: No/denies pain    Home Living Family/patient expects to be discharged to:: Private residence Living Arrangements: Children Available Help at Discharge: Family;Available 24 hours/day (until admission, patient had been alone during day) Type of Home: House Home Access: Stairs to enter Entrance Stairs-Rails: Right Entrance Stairs-Number of Steps: 3 Home Layout: One level Home Equipment: None      Prior Function Level of Independence: Independent               Hand Dominance        Extremity/Trunk Assessment   Upper Extremity Assessment: Generalized weakness           Lower Extremity Assessment: Generalized weakness      Cervical / Trunk Assessment: Normal  Communication   Communication: No difficulties  Cognition Arousal/Alertness: Awake/alert Behavior During Therapy: Flat affect Overall Cognitive Status: Within Functional Limits for tasks assessed                      General Comments      Exercises General Exercises - Lower Extremity Ankle Circles/Pumps: AROM;Both;10 reps;Seated      Assessment/Plan    PT Assessment Patient needs continued PT services  PT Diagnosis Generalized weakness   PT Problem List Decreased strength;Decreased activity tolerance;Decreased balance;Decreased mobility;Decreased knowledge of use of DME  PT Treatment Interventions Gait training;Functional mobility training;Therapeutic activities;Therapeutic exercise;Patient/family education;Balance training   PT Goals (Current goals can  be found in the Care Plan section) Acute Rehab PT Goals Patient Stated Goal: family:  get her stronger PT Goal Formulation: With patient/family Time For Goal Achievement: 08/17/15 Potential to Achieve Goals: Good    Frequency Min 3X/week   Barriers to discharge        Co-evaluation                End of Session   Activity Tolerance: Patient tolerated treatment well Patient left: in chair;with family/visitor present;with call bell/phone within reach Nurse Communication: Mobility status         Time: 1420-1445 PT Time Calculation (min) (ACUTE ONLY): 25 min   Charges:   PT Evaluation $Initial PT Evaluation Tier I: 1 Procedure     PT G CodesShanna Cisco 08/03/2015, 2:55 PM  08/03/2015 [place], [place]

## 2015-08-03 NOTE — Progress Notes (Signed)
Subjective:  Patient seen and examined today. She admits to poor appetite and fatigue. She denies any chest pain, shortness of breath, nausea, vomiting, diarrhea or abdominal pain.   CT pelvis results were discussed with the patient and her son this morning. I explained that we are continuing the work up, but the results are very concerning for malignancy. We will obtain further imaging today for work up. The son is going to call the patient's daughters today and they will be coming to the hospital later. I have informed the nurse and the patient's son to let me know when they arrive and I will be happy to speak with them. They have no further questions at this time.   Objective: Vital signs in last 24 hours: Filed Vitals:   08/02/15 2124 08/03/15 0404 08/03/15 0926 08/03/15 0947  BP: 141/85 164/89 161/86 193/89  Pulse: 101 117 104 118  Temp: 99.6 F (37.6 C) 97.6 F (36.4 C) 98.4 F (36.9 C) 98.1 F (36.7 C)  TempSrc: Oral Oral Oral Oral  Resp: 18 18 18 18   Height:      Weight: 139 lb (63.05 kg)     SpO2: 100% 94% 95% 94%   General: Vital signs reviewed. Patient is elderly, well-developed and well-nourished, in no acute distress and cooperative with exam.  Cardiovascular: Tachycardic, regular rhythm, S1 normal, S2 normal. Pulmonary/Chest: Clear to auscultation bilaterally, no wheezes, rales, or rhonchi. Abdominal: Soft, non-tender, non-distended, BS + GU: Stage IV uterine prolapse with ulceration. Extremities: No lower extremity edema bilaterally, pulses symmetric and intact bilaterally.  Skin: Warm, dry and intact. No rashes or erythema. Psychiatric: Normal mood and affect. Speech is slow and behavior is normal.    Weight change: 7 lb (3.175 kg)  Intake/Output Summary (Last 24 hours) at 08/03/15 1106 Last data filed at 08/03/15 0620  Gross per 24 hour  Intake   3570 ml  Output      2 ml  Net   3568 ml    Lab Results: Basic Metabolic Panel:  Recent Labs Lab  08/02/15 0520 08/03/15 0741  NA 138 137  K 2.9* 2.6*  CL 100* 99*  CO2 28 27  GLUCOSE 99 87  BUN <5* 5*  CREATININE 0.67 0.77  CALCIUM 10.7* 8.8*  MG 1.4* 1.4*  PHOS 1.2* 2.8   Liver Function Tests:  Recent Labs Lab 08/01/15 0536 08/02/15 0520  AST 37 35  ALT 17 19  ALKPHOS 65 60  BILITOT 0.7 0.6  PROT 7.8 6.9  ALBUMIN 2.9* 2.6*   CBC:  Recent Labs Lab 07/31/15 1519 08/01/15 0536 08/02/15 0520  WBC 10.8* 10.8* 12.7*  NEUTROABS 9.3*  --   --   HGB 14.7 11.7* 11.1*  HCT 42.9 34.9* 33.1*  MCV 89.0 88.8 89.0  PLT 505* 558* 511*   Fasting Lipid Panel:  Recent Labs Lab 08/02/15 0520  CHOL 203*  HDL 42  LDLCALC 140*  TRIG 105  CHOLHDL 4.8   Thyroid Function Tests:  Recent Labs Lab 07/31/15 1927  TSH 0.422   Urinalysis:  Recent Labs Lab 07/31/15 1842  COLORURINE AMBER*  LABSPEC 1.015  PHURINE 6.0  GLUCOSEU NEGATIVE  HGBUR LARGE*  BILIRUBINUR NEGATIVE  KETONESUR 15*  PROTEINUR 100*  UROBILINOGEN 1.0  NITRITE NEGATIVE  LEUKOCYTESUR NEGATIVE    Micro Results: Recent Results (from the past 240 hour(s))  Urine culture     Status: None   Collection Time: 07/31/15  6:42 PM  Result Value Ref Range Status  Specimen Description URINE, CATHETERIZED  Final   Special Requests NONE  Final   Culture 7,000 COLONIES/mL INSIGNIFICANT GROWTH  Final   Report Status 08/01/2015 FINAL  Final   Studies/Results: Ct Pelvis W Contrast  08/02/2015   CLINICAL DATA:  Vaginal bleeding.  Hypercalcemia.  Uterine prolapse.  EXAM: CT PELVIS WITH CONTRAST  TECHNIQUE: Multidetector CT imaging of the pelvis was performed using the standard protocol following the bolus administration of intravenous contrast.  CONTRAST:  151mL OMNIPAQUE IOHEXOL 300 MG/ML  SOLN  COMPARISON:  None.  FINDINGS: I think there is complete prolapse of the vagina and uterus. The uterus is small, as expected at this age. Within the prolapse, I think that the posterior portion of the bladder also  comes along with it. Anterior portion of the bladder retains a more normal position. The ureters are dilated to the point where they enter the prolapse on their way to the posterior portion of the bladder. There is undoubtedly extrinsic compression of the ureters at this point.  Complicating this case, there are bilateral necrotic nodal masses along both external iliac chains, measuring approximately 4-5 cm in size. This is a very worrisome finding and suggests metastatic malignancy. At the upper portion of this pelvic scan, I think there is also a necrotic node measuring 15 mm in diameter to the left of the aorta on image 3.  There is advanced atherosclerosis of the aorta and the iliac and femoral vessels. There is ordinary degenerative change of the lower lumbar spine. There is an old compression deformity of L4.  IMPRESSION: Complete prolapse of the uterus which also brings with it the posterior portion of the bladder. The central portion of the bladder is collapsed and the anterior portion of the bladder retains a more normal position. Both ureters are dilated due to extrinsic compression as they enter the prolapse on their way to the ureterovesical junctions in the portion of the bladder that is prolapsed.  Bilateral external iliac nodal masses with necrosis, 4-5 cm in diameter. This is very worrisome for metastatic malignancy. There is an additional necrotic node in the abdominal retroperitoneum to the left of the aorta on image 3.   Electronically Signed   By: Nelson Chimes M.D.   On: 08/02/2015 17:03   Medications:  I have reviewed the patient's current medications. Prior to Admission:  Prescriptions prior to admission  Medication Sig Dispense Refill Last Dose  . amLODipine (NORVASC) 5 MG tablet Take 1 tablet (5 mg total) by mouth daily. 90 tablet 3 07/31/2015 at Unknown time  . chlorthalidone (HYGROTON) 25 MG tablet Take 4 tablets (100 mg total) by mouth daily. (Patient not taking: Reported on  07/31/2015) 45 tablet 5 Not Taking at Unknown time  . citalopram (CELEXA) 20 MG tablet Take 1 tablet (20 mg total) by mouth daily. (Patient not taking: Reported on 07/31/2015) 30 tablet 3 Not Taking at Unknown time  . pravastatin (PRAVACHOL) 20 MG tablet Take 1 tablet (20 mg total) by mouth daily. (Patient not taking: Reported on 07/31/2015) 30 tablet 3 Not Taking at Unknown time   Scheduled Meds: . amLODipine  10 mg Oral Daily  . carvedilol  3.125 mg Oral BID WC  . enoxaparin (LOVENOX) injection  40 mg Subcutaneous Q24H  . Influenza vac split quadrivalent PF  0.5 mL Intramuscular Tomorrow-1000  . magnesium sulfate 1 - 4 g bolus IVPB  2 g Intravenous Once  . potassium chloride  40 mEq Oral TID   Continuous Infusions:  PRN Meds:.acetaminophen **OR** acetaminophen Assessment/Plan: Principal Problem:   Hypercalcemia Active Problems:   Essential hypertension   Hypokalemia   Vitamin D deficiency   Postmenopausal vaginal bleeding   Uterine prolapse  Severe Symptomatic Hypercalcemia 2/2 Malignancy: Corrected calcium has trended down to 10.2 today. Patient does have a history of vaginal bleeding for the last 6 to 12 months and a 15 pound weight loss. I discussed the potential for uterine malignancy with Dr. Denman George, Gyn-Onc, who stated it would be very rare for a patient to develop hypercalcemia from uterine carcinoma. If further work up still supported the need for a uterine biopsy, this can be done as outpatient. Calcium normalized with normal saline infusion, Pamindronate 60 mg qweek and calcitonin. CT abdomen/pelvis with contrast bilateral external iliac nodal masses with necrosis, 4-5 cm in diameter. This is very worrisome for metastatic malignancy. There is an additional necrotic node in the abdominal retroperitoneum to the left of the aorta. Bisphosphate will start to work by 2-3rd and will continue to help control hypercalcemia. Patient will need IV bisphosphonate every 3-4 weeks, preferably  with Zoledronic Acid as this is best for hypercalcemia 2/2 malignancy. -Discontinue IVF -Repeat CMET tomorrow am -PTH-related peptide pending (for humoral hypercalcemia of malignancy if elevated) -Vitamin D 1,25-dihydroxy pending (think lymphoma, granulomatous disease if elevated) -IV bisphosphonate once every 3-4 weeks as outpatient  Metastatic Malignancy of Unknown Origin: CT abdomen/pelvis with contrast bilateral external iliac nodal masses with necrosis, 4-5 cm in diameter. This is very worrisome for metastatic malignancy. There is an additional necrotic node in the abdominal retroperitoneum to the left of the aorta. -CT Chest/Abdomen with Contrast -CEA -CA-125  HTN: 164/89 this morning, tachycardic at 117. Patient is on amlodipine at home. Tachycardia may be due to electrolyte abnormalities.  -Increase to amlodipine to 10 mg daily  -Carvedilol 3.125 mg BID -Continue to monitor  Hypokalemia: Likely secondary to massive IVF hydration. K 2.9 this morning. -D/C IVF -Kdur 40 mEq TID x 3 -Repeat CMET tomorrow am  Hypophosphatemia: Likely secondary to massive IVF hydration. Phosphorus 2.8 this morning.  -D/C IVF -Repeat phosphorus tomorrow am  Hypomagnesemia: Likely secondary to massive IVF hydration. Magnesium 1.4 this morning.  -D/C IVF -Magnesium 2 grams IVPB -Repeat magnesium tomorrow am  DVT/PE ppx: Lovenox SQ QD  Dispo: Disposition is deferred at this time, awaiting improvement of current medical problems.  Anticipated discharge in approximately 2-3 day(s).   The patient does not have a current PCP (No primary care provider on file.) and does need an [facility] hospital follow-up appointment after discharge.  The patient does have transportation limitations that hinder transportation to clinic appointments.    LOS: 3 days   Osa Craver, DO PGY-1 Internal Medicine Resident Pager # [phone] 08/03/2015 11:06 AM

## 2015-08-04 ENCOUNTER — Encounter (HOSPITAL_COMMUNITY): Payer: Self-pay | Admitting: General Surgery

## 2015-08-04 DIAGNOSIS — R591 Generalized enlarged lymph nodes: Secondary | ICD-10-CM | POA: Insufficient documentation

## 2015-08-04 DIAGNOSIS — N950 Postmenopausal bleeding: Secondary | ICD-10-CM

## 2015-08-04 LAB — COMPREHENSIVE METABOLIC PANEL
ALBUMIN: 2.3 g/dL — AB (ref 3.5–5.0)
ALT: 29 U/L (ref 14–54)
ANION GAP: 12 (ref 5–15)
AST: 41 U/L (ref 15–41)
Alkaline Phosphatase: 69 U/L (ref 38–126)
BILIRUBIN TOTAL: 1.2 mg/dL (ref 0.3–1.2)
BUN: 9 mg/dL (ref 6–20)
CHLORIDE: 98 mmol/L — AB (ref 101–111)
CO2: 26 mmol/L (ref 22–32)
Calcium: 8.9 mg/dL (ref 8.9–10.3)
Creatinine, Ser: 0.97 mg/dL (ref 0.44–1.00)
GFR calc Af Amer: >60 mL/min (ref >60)
GFR calc non Af Amer: 57 mL/min — ABNORMAL LOW (ref >60)
GLUCOSE: 86 mg/dL (ref 65–99)
POTASSIUM: 3.5 mmol/L (ref 3.5–5.1)
SODIUM: 136 mmol/L (ref 135–145)
Total Protein: 6.6 g/dL (ref 6.5–8.1)

## 2015-08-04 LAB — CYTOLOGY - PAP

## 2015-08-04 LAB — CBC WITH DIFFERENTIAL/PLATELET
BASOS ABS: 0 10*3/uL (ref 0.0–0.1)
Basophils Relative: 0 %
EOS PCT: 1 %
Eosinophils Absolute: 0.1 10*3/uL (ref 0.0–0.7)
HCT: 33.1 % — ABNORMAL LOW (ref 36.0–46.0)
HEMOGLOBIN: 11.1 g/dL — AB (ref 12.0–15.0)
LYMPHS ABS: 0.9 10*3/uL (ref 0.7–4.0)
LYMPHS PCT: 8 %
MCH: 29.6 pg (ref 26.0–34.0)
MCHC: 33.5 g/dL (ref 30.0–36.0)
MCV: 88.3 fL (ref 78.0–100.0)
Monocytes Absolute: 0.8 10*3/uL (ref 0.1–1.0)
Monocytes Relative: 8 %
NEUTROS PCT: 83 %
Neutro Abs: 9.4 10*3/uL — ABNORMAL HIGH (ref 1.7–7.7)
PLATELETS: 459 10*3/uL — AB (ref 150–400)
RBC: 3.75 MIL/uL — AB (ref 3.87–5.11)
RDW: 15.9 % — ABNORMAL HIGH (ref 11.5–15.5)
WBC: 11.3 10*3/uL — AB (ref 4.0–10.5)

## 2015-08-04 LAB — VITAMIN D 1,25 DIHYDROXY
VITAMIN D 1, 25 (OH) TOTAL: 58 pg/mL
VITAMIN D3 1, 25 (OH): 58 pg/mL
Vitamin D2 1, 25 (OH)2: <10 pg/mL

## 2015-08-04 LAB — CEA: CEA: 2.5 ng/mL (ref 0.0–4.7)

## 2015-08-04 LAB — GLUCOSE, CAPILLARY: Glucose-Capillary: 83 mg/dL (ref 65–99)

## 2015-08-04 LAB — PHOSPHORUS: Phosphorus: 2.1 mg/dL — ABNORMAL LOW (ref 2.5–4.6)

## 2015-08-04 LAB — MAGNESIUM: Magnesium: 1.8 mg/dL (ref 1.7–2.4)

## 2015-08-04 LAB — CA 125: CA 125: 14.9 U/mL (ref 0.0–38.1)

## 2015-08-04 MED ORDER — ENOXAPARIN SODIUM 40 MG/0.4ML ~~LOC~~ SOLN
40.0000 mg | SUBCUTANEOUS | Status: DC
  Administered 2015-08-04 – 2015-08-06 (×3): 40 mg via SUBCUTANEOUS
  Filled 2015-08-04 (×3): qty 0.4

## 2015-08-04 MED ORDER — CARVEDILOL 6.25 MG PO TABS
6.2500 mg | ORAL_TABLET | Freq: Two times a day (BID) | ORAL | Status: DC
  Administered 2015-08-04 (×2): 6.25 mg via ORAL
  Filled 2015-08-04 (×3): qty 1

## 2015-08-04 MED ORDER — DIPHENOXYLATE-ATROPINE 2.5-0.025 MG PO TABS
1.0000 | ORAL_TABLET | Freq: Four times a day (QID) | ORAL | Status: DC
  Administered 2015-08-04 – 2015-08-07 (×9): 1 via ORAL
  Filled 2015-08-04 (×11): qty 1

## 2015-08-04 MED ORDER — POTASSIUM CHLORIDE CRYS ER 20 MEQ PO TBCR
40.0000 meq | EXTENDED_RELEASE_TABLET | Freq: Once | ORAL | Status: AC
  Administered 2015-08-04: 40 meq via ORAL
  Filled 2015-08-04: qty 2

## 2015-08-04 NOTE — Progress Notes (Signed)
Subjective:  Patient seen and examined today. She admits to fatigue and decreased appetite. Patient was febrile yesterday evening on 100.4. She denies any fever, chills, cough, shortness of breath, dysuria, suprapubic pain or flank pain.   Objective: Vital signs in last 24 hours: Filed Vitals:   08/03/15 1700 08/03/15 1829 08/03/15 2107 08/04/15 0544  BP: 147/84  133/82 145/76  Pulse:  100 94 98  Temp:  100.4 F (38 C) 98.9 F (37.2 C) 99.2 F (37.3 C)  TempSrc:  Oral Oral Oral  Resp:  17 17 17   Height:      Weight:      SpO2: 90%  96% 98%   General: Vital signs reviewed. Patient is elderly, well-developed and well-nourished, in no acute distress and cooperative with exam.  Cardiovascular: Tachycardic, regular rhythm, S1 normal, S2 normal. Pulmonary/Chest: Clear to auscultation bilaterally, no wheezes, rales, or rhonchi. Abdominal: Soft, non-tender, non-distended, BS + Extremities: No lower extremity edema bilaterally, pulses symmetric and intact bilaterally.  Skin: Warm, dry and intact. No rashes or erythema. Psychiatric: Normal mood and affect. Speech is slow and behavior is normal.    Weight change:   Intake/Output Summary (Last 24 hours) at 08/04/15 0843 Last data filed at 08/03/15 1700  Gross per 24 hour  Intake      0 ml  Output      0 ml  Net      0 ml    Lab Results: Basic Metabolic Panel:  Recent Labs Lab 08/03/15 0741 08/04/15 0534  NA 137 136  K 2.6* 3.5  CL 99* 98*  CO2 27 26  GLUCOSE 87 86  BUN 5* 9  CREATININE 0.77 0.97  CALCIUM 8.8* 8.9  MG 1.4* 1.8  PHOS 2.8 2.1*   Liver Function Tests:  Recent Labs Lab 08/02/15 0520 08/04/15 0534  AST 35 41  ALT 19 29  ALKPHOS 60 69  BILITOT 0.6 1.2  PROT 6.9 6.6  ALBUMIN 2.6* 2.3*   CBC:  Recent Labs Lab 07/31/15 1519 08/01/15 0536 08/02/15 0520  WBC 10.8* 10.8* 12.7*  NEUTROABS 9.3*  --   --   HGB 14.7 11.7* 11.1*  HCT 42.9 34.9* 33.1*  MCV 89.0 88.8 89.0  PLT 505* 558* 511*    Fasting Lipid Panel:  Recent Labs Lab 08/02/15 0520  CHOL 203*  HDL 42  LDLCALC 140*  TRIG 105  CHOLHDL 4.8   Thyroid Function Tests:  Recent Labs Lab 07/31/15 1927  TSH 0.422   Urinalysis:  Recent Labs Lab 07/31/15 1842  COLORURINE AMBER*  LABSPEC 1.015  PHURINE 6.0  GLUCOSEU NEGATIVE  HGBUR LARGE*  BILIRUBINUR NEGATIVE  KETONESUR 15*  PROTEINUR 100*  UROBILINOGEN 1.0  NITRITE NEGATIVE  LEUKOCYTESUR NEGATIVE    Micro Results: Recent Results (from the past 240 hour(s))  Urine culture     Status: None   Collection Time: 07/31/15  6:42 PM  Result Value Ref Range Status   Specimen Description URINE, CATHETERIZED  Final   Special Requests NONE  Final   Culture 7,000 COLONIES/mL INSIGNIFICANT GROWTH  Final   Report Status 08/01/2015 FINAL  Final   Studies/Results: Ct Chest W Contrast  08/03/2015   CLINICAL DATA:  69 year old female with clinical concern for malignancy. Vaginal bleeding. Hypercalcemia. History of uterine prolapse.  EXAM: CT CHEST AND ABDOMEN WITH CONTRAST  TECHNIQUE: Multidetector CT imaging of the chest and abdomen was performed following the standard protocol during bolus administration of intravenous contrast.  CONTRAST:  129mL OMNIPAQUE IOHEXOL  300 MG/ML  SOLN  COMPARISON:  Pelvic CT 08/02/2015.  FINDINGS: CT CHEST FINDINGS  Mediastinum/Lymph Nodes: Heart size is normal. There is no significant pericardial fluid, thickening or pericardial calcification. There is atherosclerosis of the thoracic aorta, the great vessels of the mediastinum and the coronary arteries, including calcified atherosclerotic plaque in the left main, left anterior descending and left circumflex coronary arteries. No pathologically enlarged mediastinal or hilar lymph nodes. Small hiatal hernia. No axillary lymphadenopathy.  Lungs/Pleura: 4 mm subpleural nodule in the anterior aspect of the right middle lobe (image 31 of series 205). A few other scattered 2-3 mm nodules are noted  in the left upper lobe, but are highly nonspecific. Larger more suspicious appearing pulmonary nodules or masses are otherwise noted. There is a background of moderate centrilobular emphysema. No acute consolidative airspace disease. No pleural effusions. Bibasilar areas of subsegmental atelectasis and/or scarring are noted dependently.  Musculoskeletal/Soft Tissues: There are no aggressive appearing lytic or blastic lesions noted in the visualized portions of the skeleton.  CT ABDOMEN FINDINGS  Hepatobiliary: Sub cm low-attenuation lesion segment 7 of the liver is too small to definitively characterize, but is statistically likely a tiny cyst. No other suspicious cystic or solid hepatic lesions are noted. Dependent high attenuation material within the gallbladder is most compatible with biliary sludge. No current findings to suggest an acute cholecystitis at this time.  Pancreas: No pancreatic mass. No pancreatic ductal dilatation. No pancreatic or peripancreatic fluid or inflammatory changes.  Spleen: Unremarkable.  Adrenals/Urinary Tract: Numerous well-defined low-attenuation lesions in the kidneys bilaterally, compatible with simple cysts, largest of which measures up to 7.9 cm in the lower pole of the left kidney. Moderate to severe bilateral hydroureteronephrosis in the visualized portions of the abdomen. Bilateral adrenal glands are normal in appearance.  Stomach/Bowel: Normal appearance of the stomach. No pathologic dilatation of visualized portions of small bowel or colon. A few scattered colonic diverticulae are noted, without surrounding inflammatory changes to suggest an acute diverticulitis in the visualized portions of the abdomen at this time.  Vascular/Lymphatic: Extensive atherosclerosis throughout the abdominal vasculature, without evidence of aneurysm or dissection in the visualized abdomen. Numerous borderline enlarged and mildly enlarged retroperitoneal lymph nodes measuring up to 12 mm in short  axis are noted in the left para-aortic nodal station.  Other: No significant volume of ascites in the visualized peritoneal cavity. No pneumoperitoneum.  Musculoskeletal: There are no aggressive appearing lytic or blastic lesions noted in the visualized portions of the skeleton. Compression fracture of superior endplate of L2 with approximately 30% loss of central vertebral body height.  IMPRESSION: 1. While there is some lymphadenopathy noted in the retroperitoneum, this is nonspecific. This could be reactive given the patient's uterine prolapse and associated bilateral hydroureteronephrosis. However, given the patient's prominent bilateral adnexal structures, which could represent lymph nodes or ovarian tissue, underlying malignancy is suspected. Further clinical correlation is suggested. 2. No other definite evidence of extra nodal metastatic disease in the abdomen. 3. There are several small nonspecific pulmonary nodules in the lungs bilaterally. While metastatic disease is not excluded, it is not strongly favored. The largest of these nodules is a 4 mm subpleural nodule in the anterior aspect of the right middle lobe (image 31 of series 205). Attention on any future followup studies is recommended to ensure stability. At the very least, a repeat noncontrast chest CT should be performed in 12 months. 4. Atherosclerosis, including left main and 2 vessel coronary artery disease. Assessment for potential risk factor  modification, dietary therapy or pharmacologic therapy may be warranted, if clinically indicated. 5. Biliary sludge lying dependently in the gallbladder. No current findings to suggest an acute cholecystitis at this time. 6. Additional incidental findings, as above.   Electronically Signed   By: Vinnie Langton M.D.   On: 08/03/2015 18:25   Ct Pelvis W Contrast  08/02/2015   CLINICAL DATA:  Vaginal bleeding.  Hypercalcemia.  Uterine prolapse.  EXAM: CT PELVIS WITH CONTRAST  TECHNIQUE: Multidetector  CT imaging of the pelvis was performed using the standard protocol following the bolus administration of intravenous contrast.  CONTRAST:  151mL OMNIPAQUE IOHEXOL 300 MG/ML  SOLN  COMPARISON:  None.  FINDINGS: I think there is complete prolapse of the vagina and uterus. The uterus is small, as expected at this age. Within the prolapse, I think that the posterior portion of the bladder also comes along with it. Anterior portion of the bladder retains a more normal position. The ureters are dilated to the point where they enter the prolapse on their way to the posterior portion of the bladder. There is undoubtedly extrinsic compression of the ureters at this point.  Complicating this case, there are bilateral necrotic nodal masses along both external iliac chains, measuring approximately 4-5 cm in size. This is a very worrisome finding and suggests metastatic malignancy. At the upper portion of this pelvic scan, I think there is also a necrotic node measuring 15 mm in diameter to the left of the aorta on image 3.  There is advanced atherosclerosis of the aorta and the iliac and femoral vessels. There is ordinary degenerative change of the lower lumbar spine. There is an old compression deformity of L4.  IMPRESSION: Complete prolapse of the uterus which also brings with it the posterior portion of the bladder. The central portion of the bladder is collapsed and the anterior portion of the bladder retains a more normal position. Both ureters are dilated due to extrinsic compression as they enter the prolapse on their way to the ureterovesical junctions in the portion of the bladder that is prolapsed.  Bilateral external iliac nodal masses with necrosis, 4-5 cm in diameter. This is very worrisome for metastatic malignancy. There is an additional necrotic node in the abdominal retroperitoneum to the left of the aorta on image 3.   Electronically Signed   By: Nelson Chimes M.D.   On: 08/02/2015 17:03   Ct Abdomen W  Contrast  08/03/2015   CLINICAL DATA:  69 year old female with clinical concern for malignancy. Vaginal bleeding. Hypercalcemia. History of uterine prolapse.  EXAM: CT CHEST AND ABDOMEN WITH CONTRAST  TECHNIQUE: Multidetector CT imaging of the chest and abdomen was performed following the standard protocol during bolus administration of intravenous contrast.  CONTRAST:  146mL OMNIPAQUE IOHEXOL 300 MG/ML  SOLN  COMPARISON:  Pelvic CT 08/02/2015.  FINDINGS: CT CHEST FINDINGS  Mediastinum/Lymph Nodes: Heart size is normal. There is no significant pericardial fluid, thickening or pericardial calcification. There is atherosclerosis of the thoracic aorta, the great vessels of the mediastinum and the coronary arteries, including calcified atherosclerotic plaque in the left main, left anterior descending and left circumflex coronary arteries. No pathologically enlarged mediastinal or hilar lymph nodes. Small hiatal hernia. No axillary lymphadenopathy.  Lungs/Pleura: 4 mm subpleural nodule in the anterior aspect of the right middle lobe (image 31 of series 205). A few other scattered 2-3 mm nodules are noted in the left upper lobe, but are highly nonspecific. Larger more suspicious appearing pulmonary nodules or masses  are otherwise noted. There is a background of moderate centrilobular emphysema. No acute consolidative airspace disease. No pleural effusions. Bibasilar areas of subsegmental atelectasis and/or scarring are noted dependently.  Musculoskeletal/Soft Tissues: There are no aggressive appearing lytic or blastic lesions noted in the visualized portions of the skeleton.  CT ABDOMEN FINDINGS  Hepatobiliary: Sub cm low-attenuation lesion segment 7 of the liver is too small to definitively characterize, but is statistically likely a tiny cyst. No other suspicious cystic or solid hepatic lesions are noted. Dependent high attenuation material within the gallbladder is most compatible with biliary sludge. No current  findings to suggest an acute cholecystitis at this time.  Pancreas: No pancreatic mass. No pancreatic ductal dilatation. No pancreatic or peripancreatic fluid or inflammatory changes.  Spleen: Unremarkable.  Adrenals/Urinary Tract: Numerous well-defined low-attenuation lesions in the kidneys bilaterally, compatible with simple cysts, largest of which measures up to 7.9 cm in the lower pole of the left kidney. Moderate to severe bilateral hydroureteronephrosis in the visualized portions of the abdomen. Bilateral adrenal glands are normal in appearance.  Stomach/Bowel: Normal appearance of the stomach. No pathologic dilatation of visualized portions of small bowel or colon. A few scattered colonic diverticulae are noted, without surrounding inflammatory changes to suggest an acute diverticulitis in the visualized portions of the abdomen at this time.  Vascular/Lymphatic: Extensive atherosclerosis throughout the abdominal vasculature, without evidence of aneurysm or dissection in the visualized abdomen. Numerous borderline enlarged and mildly enlarged retroperitoneal lymph nodes measuring up to 12 mm in short axis are noted in the left para-aortic nodal station.  Other: No significant volume of ascites in the visualized peritoneal cavity. No pneumoperitoneum.  Musculoskeletal: There are no aggressive appearing lytic or blastic lesions noted in the visualized portions of the skeleton. Compression fracture of superior endplate of L2 with approximately 30% loss of central vertebral body height.  IMPRESSION: 1. While there is some lymphadenopathy noted in the retroperitoneum, this is nonspecific. This could be reactive given the patient's uterine prolapse and associated bilateral hydroureteronephrosis. However, given the patient's prominent bilateral adnexal structures, which could represent lymph nodes or ovarian tissue, underlying malignancy is suspected. Further clinical correlation is suggested. 2. No other definite  evidence of extra nodal metastatic disease in the abdomen. 3. There are several small nonspecific pulmonary nodules in the lungs bilaterally. While metastatic disease is not excluded, it is not strongly favored. The largest of these nodules is a 4 mm subpleural nodule in the anterior aspect of the right middle lobe (image 31 of series 205). Attention on any future followup studies is recommended to ensure stability. At the very least, a repeat noncontrast chest CT should be performed in 12 months. 4. Atherosclerosis, including left main and 2 vessel coronary artery disease. Assessment for potential risk factor modification, dietary therapy or pharmacologic therapy may be warranted, if clinically indicated. 5. Biliary sludge lying dependently in the gallbladder. No current findings to suggest an acute cholecystitis at this time. 6. Additional incidental findings, as above.   Electronically Signed   By: Vinnie Langton M.D.   On: 08/03/2015 18:25   Medications:  I have reviewed the patient's current medications. Prior to Admission:  Prescriptions prior to admission  Medication Sig Dispense Refill Last Dose  . amLODipine (NORVASC) 5 MG tablet Take 1 tablet (5 mg total) by mouth daily. 90 tablet 3 07/31/2015 at Unknown time  . chlorthalidone (HYGROTON) 25 MG tablet Take 4 tablets (100 mg total) by mouth daily. (Patient not taking: Reported on 07/31/2015) 45  tablet 5 Not Taking at Unknown time  . citalopram (CELEXA) 20 MG tablet Take 1 tablet (20 mg total) by mouth daily. (Patient not taking: Reported on 07/31/2015) 30 tablet 3 Not Taking at Unknown time  . pravastatin (PRAVACHOL) 20 MG tablet Take 1 tablet (20 mg total) by mouth daily. (Patient not taking: Reported on 07/31/2015) 30 tablet 3 Not Taking at Unknown time   Scheduled Meds: . amLODipine  10 mg Oral Daily  . carvedilol  6.25 mg Oral BID WC  . enoxaparin (LOVENOX) injection  40 mg Subcutaneous Q24H  . Influenza vac split quadrivalent PF  0.5 mL  Intramuscular Tomorrow-1000   Continuous Infusions:   PRN Meds:.acetaminophen **OR** acetaminophen Assessment/Plan: Principal Problem:   Hypercalcemia Active Problems:   Essential hypertension   Hypokalemia   Vitamin D deficiency   Postmenopausal vaginal bleeding   Uterine prolapse  Severe Symptomatic Hypercalcemia 2/2 Malignancy: Corrected calcium 10.2>10.6 today. Calcium improved with normal saline infusion, Pamindronate 60 mg qweek and calcitonin. CT abdomen/pelvis with contrast bilateral external iliac nodal masses with necrosis, 4-5 cm in diameter. This is very worrisome for metastatic malignancy. There is an additional necrotic node in the abdominal retroperitoneum to the left of the aorta. Bisphosphate will start to work by 2-3rd and will continue to help control hypercalcemia. Patient will need IV bisphosphonate every 3-4 weeks, preferably with Zoledronic Acid as this is best for hypercalcemia 2/2 malignancy. -Repeat BMET tomorrow am -PTH-related peptide pending (for humoral hypercalcemia of malignancy if elevated) -Vitamin D 1,25-dihydroxy pending (think lymphoma, granulomatous disease if elevated) -IV bisphosphonate once every 3-4 weeks as outpatient  Metastatic Malignancy of Unknown Origin: CT pelvis with contrast bilateral external iliac nodal masses with necrosis, 4-5 cm in diameter. This is very worrisome for metastatic malignancy. There is an additional necrotic node in the abdominal retroperitoneum to the left of the aorta. CT abdomen again showed nonspecific lymphadenopathy in the retroperitoneum, which may be reactive given the patient's uterine prolapse and associated bilateral hydroureteronephrosis. However, given the patient's prominent bilateral adnexal structures, which could represent lymph nodes or ovarian tissue, underlying malignancy is suspected. This was discussed with Dr. Julien Nordmann with Hem/Onc who recommended consulting surgery for tissue diagnosis.  -Discussed with  Dr. Julien Nordmann, Hem/Onc -Consulted surgery for possible biopsy for tissue diagnosis -CEA -CA-125  Febrile: 100.4 yesterday evening. Mild leukocytosis on 10/8. Asymptomatic. Unclear source of infection. UA was negative for infection on 10/6.  -Add on CBC with diff -Blood cultures -Consider repeat UA  Nonspecific Pulmonary Nodules: CT chest showed several small nonspecific pulmonary nodules in the lungs bilaterally. While metastatic disease is not excluded, it is not strongly favored. The largest of these nodules is a 4 mm subpleural nodule in the anterior aspect of the right middle lobe -Repeat noncontrast chest CT should be performed in 12 months.  HTN: 145/76 this morning.   -Amlodipine to 10 mg daily  -Carvedilol 6.25 mg BID -Continue to monitor  Hypokalemia: Likely secondary to massive IVF hydration. K 3.5 this morning. -Kdur 40 mEq once -Repeat BMET tomorrow am  Hypophosphatemia: Likely secondary to massive IVF hydration. Phosphorus 2.1 this morning.  -Repeat phosphorus tomorrow am  Hypomagnesemia: Likely secondary to massive IVF hydration. Magnesium 1.8 this morning.  -Repeat magnesium tomorrow am  DVT/PE ppx: Lovenox SQ QD  Dispo: Disposition is deferred at this time, awaiting improvement of current medical problems.  Anticipated discharge in approximately 2-3 day(s).   The patient does not have a current PCP (No primary care provider on file.) and  does need an [facility] hospital follow-up appointment after discharge.  The patient does have transportation limitations that hinder transportation to clinic appointments.    LOS: 4 days   Osa Craver, DO PGY-1 Internal Medicine Resident Pager # [phone] 08/04/2015 8:43 AM

## 2015-08-04 NOTE — Consult Note (Signed)
Chief Complaint: Patient was seen in consultation today for hypercalcemia; febrile- unknown cause Chief Complaint  Patient presents with  . Altered Mental Status  . Fall   at the request of Dr Daryll Drown  Referring Physician(s): IM  History of Present Illness: MAIGAN BITTINGER is a 69 y.o. female   Pt found down at home- had fallen with Knee pain Fatigue; confusion Work up in hospital has revealed hypercalcemia with fever Unknown cause NG:[id number] external iliac nodal masses with necrosis, 4-5 cm in diameter. This is very worrisome for metastatic malignancy. There is an additional necrotic node in the abdominal retroperitoneum to the left of the aorta on image 3  Request for biopsy pf Rt iliac LAN Dr Barbie Banner has reviewed imaging and approves procedure  Past Medical History  Diagnosis Date  . Hypertension     Past Surgical History  Procedure Laterality Date  . Tubal ligation      Allergies: Review of patient's allergies indicates no known allergies.  Medications: Prior to Admission medications   Medication Sig Start Date End Date Taking? Authorizing Provider  amLODipine (NORVASC) 5 MG tablet Take 1 tablet (5 mg total) by mouth daily. 01/15/15  Yes Posey Boyer, MD  chlorthalidone (HYGROTON) 25 MG tablet Take 4 tablets (100 mg total) by mouth daily. Patient not taking: Reported on 07/31/2015 01/15/15   Posey Boyer, MD  citalopram (CELEXA) 20 MG tablet Take 1 tablet (20 mg total) by mouth daily. Patient not taking: Reported on 07/31/2015 07/31/13   Robyn Haber, MD  pravastatin (PRAVACHOL) 20 MG tablet Take 1 tablet (20 mg total) by mouth daily. Patient not taking: Reported on 07/31/2015 01/21/15   Posey Boyer, MD     Family History  Problem Relation Age of Onset  . Hypertension Mother   . Cancer Mother   . Lymphoma Mother   . Hyperthyroidism Daughter     Social History   Social History  . Marital Status: Divorced    Spouse Name: N/A  . Number of  Children: N/A  . Years of Education: N/A   Social History Main Topics  . Smoking status: Never Smoker   . Smokeless tobacco: None  . Alcohol Use: None  . Drug Use: None  . Sexual Activity: Not Asked   Other Topics Concern  . None   Social History Narrative     Review of Systems: A 12 point ROS discussed and pertinent positives are indicated in the HPI above.  All other systems are negative.  Review of Systems  Constitutional: Positive for fever, activity change, appetite change and fatigue.  Eyes: Negative for visual disturbance.  Respiratory: Negative for shortness of breath.   Gastrointestinal: Negative for abdominal pain and abdominal distention.  Musculoskeletal: Positive for gait problem.  Neurological: Positive for weakness.  Psychiatric/Behavioral: Positive for confusion.    Vital Signs: BP 145/76 mmHg  Pulse 98  Temp(Src) 99.2 F (37.3 C) (Oral)  Resp 17  Ht 5\' 5"  (1.651 m)  Wt 139 lb (63.05 kg)  BMI 23.13 kg/m2  SpO2 98%  Physical Exam  Cardiovascular: Normal rate and regular rhythm.   Pulmonary/Chest: Effort normal and breath sounds normal.  Abdominal: Soft. Bowel sounds are normal.  Musculoskeletal: Normal range of motion.  Neurological: She is alert.  Cannot state DOB Does answer name correctly  Skin: Skin is warm.  Psychiatric: She has a normal mood and affect.  Consented dtr at bedside  Nursing note and vitals reviewed.   Mallampati Score:  MD Evaluation Airway: WNL Heart: WNL Abdomen: WNL Chest/ Lungs: WNL ASA  Classification: 3 Mallampati/Airway Score: Two  Imaging: Dg Chest 2 View  07/31/2015   CLINICAL DATA:  [place] this morning, hypertension, weakness in legs for 2 months  EXAM: CHEST  2 VIEW  COMPARISON:  None  FINDINGS: Normal heart size, mediastinal contours and pulmonary vascularity.  Calcification and mild tortuosity of thoracic aorta.  Mild bibasilar atelectasis.  Lungs otherwise clear.  No pleural effusion or pneumothorax.   Bones demineralized.  IMPRESSION: Mild bibasilar atelectasis.   Electronically Signed   By: Lavonia Dana M.D.   On: 07/31/2015 16:00   Ct Head Wo Contrast  07/31/2015   CLINICAL DATA:  69YOF right knee gave out on her today and she lowered herself to the floor. Per EMS pts family stated that pt has been slow to respond today and that she has a Possible right side facial droop. EMS noted foul smelling urine  EXAM: CT HEAD WITHOUT CONTRAST  TECHNIQUE: Contiguous axial images were obtained from the base of the skull through the vertex without intravenous contrast.  COMPARISON:  None.  FINDINGS: Probable retention cysts or polyps in the maxillary sinuses, incompletely visualized. Atherosclerotic and physiologic intracranial calcifications. Early right basal ganglia mineralization. Diffuse parenchymal atrophy. Patchy areas of hypoattenuation in deep and periventricular white matter bilaterally. Negative for acute intracranial hemorrhage, mass lesion, acute infarction, midline shift, or mass-effect. Acute infarct may be inapparent on noncontrast CT. Ventricles and sulci symmetric. Bone windows demonstrate no focal lesion.  IMPRESSION: 1. Negative for bleed or other acute intracranial process. 2. Atrophy and nonspecific white matter changes.   Electronically Signed   By: Lucrezia Europe M.D.   On: 07/31/2015 16:14   Ct Chest W Contrast  08/03/2015   CLINICAL DATA:  69 year old female with clinical concern for malignancy. Vaginal bleeding. Hypercalcemia. History of uterine prolapse.  EXAM: CT CHEST AND ABDOMEN WITH CONTRAST  TECHNIQUE: Multidetector CT imaging of the chest and abdomen was performed following the standard protocol during bolus administration of intravenous contrast.  CONTRAST:  180mL OMNIPAQUE IOHEXOL 300 MG/ML  SOLN  COMPARISON:  Pelvic CT 08/02/2015.  FINDINGS: CT CHEST FINDINGS  Mediastinum/Lymph Nodes: Heart size is normal. There is no significant pericardial fluid, thickening or pericardial  calcification. There is atherosclerosis of the thoracic aorta, the great vessels of the mediastinum and the coronary arteries, including calcified atherosclerotic plaque in the left main, left anterior descending and left circumflex coronary arteries. No pathologically enlarged mediastinal or hilar lymph nodes. Small hiatal hernia. No axillary lymphadenopathy.  Lungs/Pleura: 4 mm subpleural nodule in the anterior aspect of the right middle lobe (image 31 of series 205). A few other scattered 2-3 mm nodules are noted in the left upper lobe, but are highly nonspecific. Larger more suspicious appearing pulmonary nodules or masses are otherwise noted. There is a background of moderate centrilobular emphysema. No acute consolidative airspace disease. No pleural effusions. Bibasilar areas of subsegmental atelectasis and/or scarring are noted dependently.  Musculoskeletal/Soft Tissues: There are no aggressive appearing lytic or blastic lesions noted in the visualized portions of the skeleton.  CT ABDOMEN FINDINGS  Hepatobiliary: Sub cm low-attenuation lesion segment 7 of the liver is too small to definitively characterize, but is statistically likely a tiny cyst. No other suspicious cystic or solid hepatic lesions are noted. Dependent high attenuation material within the gallbladder is most compatible with biliary sludge. No current findings to suggest an acute cholecystitis at this time.  Pancreas: No pancreatic mass.  No pancreatic ductal dilatation. No pancreatic or peripancreatic fluid or inflammatory changes.  Spleen: Unremarkable.  Adrenals/Urinary Tract: Numerous well-defined low-attenuation lesions in the kidneys bilaterally, compatible with simple cysts, largest of which measures up to 7.9 cm in the lower pole of the left kidney. Moderate to severe bilateral hydroureteronephrosis in the visualized portions of the abdomen. Bilateral adrenal glands are normal in appearance.  Stomach/Bowel: Normal appearance of the  stomach. No pathologic dilatation of visualized portions of small bowel or colon. A few scattered colonic diverticulae are noted, without surrounding inflammatory changes to suggest an acute diverticulitis in the visualized portions of the abdomen at this time.  Vascular/Lymphatic: Extensive atherosclerosis throughout the abdominal vasculature, without evidence of aneurysm or dissection in the visualized abdomen. Numerous borderline enlarged and mildly enlarged retroperitoneal lymph nodes measuring up to 12 mm in short axis are noted in the left para-aortic nodal station.  Other: No significant volume of ascites in the visualized peritoneal cavity. No pneumoperitoneum.  Musculoskeletal: There are no aggressive appearing lytic or blastic lesions noted in the visualized portions of the skeleton. Compression fracture of superior endplate of L2 with approximately 30% loss of central vertebral body height.  IMPRESSION: 1. While there is some lymphadenopathy noted in the retroperitoneum, this is nonspecific. This could be reactive given the patient's uterine prolapse and associated bilateral hydroureteronephrosis. However, given the patient's prominent bilateral adnexal structures, which could represent lymph nodes or ovarian tissue, underlying malignancy is suspected. Further clinical correlation is suggested. 2. No other definite evidence of extra nodal metastatic disease in the abdomen. 3. There are several small nonspecific pulmonary nodules in the lungs bilaterally. While metastatic disease is not excluded, it is not strongly favored. The largest of these nodules is a 4 mm subpleural nodule in the anterior aspect of the right middle lobe (image 31 of series 205). Attention on any future followup studies is recommended to ensure stability. At the very least, a repeat noncontrast chest CT should be performed in 12 months. 4. Atherosclerosis, including left main and 2 vessel coronary artery disease. Assessment for  potential risk factor modification, dietary therapy or pharmacologic therapy may be warranted, if clinically indicated. 5. Biliary sludge lying dependently in the gallbladder. No current findings to suggest an acute cholecystitis at this time. 6. Additional incidental findings, as above.   Electronically Signed   By: Vinnie Langton M.D.   On: 08/03/2015 18:25   Ct Pelvis W Contrast  08/02/2015   CLINICAL DATA:  Vaginal bleeding.  Hypercalcemia.  Uterine prolapse.  EXAM: CT PELVIS WITH CONTRAST  TECHNIQUE: Multidetector CT imaging of the pelvis was performed using the standard protocol following the bolus administration of intravenous contrast.  CONTRAST:  143mL OMNIPAQUE IOHEXOL 300 MG/ML  SOLN  COMPARISON:  None.  FINDINGS: I think there is complete prolapse of the vagina and uterus. The uterus is small, as expected at this age. Within the prolapse, I think that the posterior portion of the bladder also comes along with it. Anterior portion of the bladder retains a more normal position. The ureters are dilated to the point where they enter the prolapse on their way to the posterior portion of the bladder. There is undoubtedly extrinsic compression of the ureters at this point.  Complicating this case, there are bilateral necrotic nodal masses along both external iliac chains, measuring approximately 4-5 cm in size. This is a very worrisome finding and suggests metastatic malignancy. At the upper portion of this pelvic scan, I think there is also a  necrotic node measuring 15 mm in diameter to the left of the aorta on image 3.  There is advanced atherosclerosis of the aorta and the iliac and femoral vessels. There is ordinary degenerative change of the lower lumbar spine. There is an old compression deformity of L4.  IMPRESSION: Complete prolapse of the uterus which also brings with it the posterior portion of the bladder. The central portion of the bladder is collapsed and the anterior portion of the bladder  retains a more normal position. Both ureters are dilated due to extrinsic compression as they enter the prolapse on their way to the ureterovesical junctions in the portion of the bladder that is prolapsed.  Bilateral external iliac nodal masses with necrosis, 4-5 cm in diameter. This is very worrisome for metastatic malignancy. There is an additional necrotic node in the abdominal retroperitoneum to the left of the aorta on image 3.   Electronically Signed   By: Nelson Chimes M.D.   On: 08/02/2015 17:03   Ct Abdomen W Contrast  08/03/2015   CLINICAL DATA:  69 year old female with clinical concern for malignancy. Vaginal bleeding. Hypercalcemia. History of uterine prolapse.  EXAM: CT CHEST AND ABDOMEN WITH CONTRAST  TECHNIQUE: Multidetector CT imaging of the chest and abdomen was performed following the standard protocol during bolus administration of intravenous contrast.  CONTRAST:  131mL OMNIPAQUE IOHEXOL 300 MG/ML  SOLN  COMPARISON:  Pelvic CT 08/02/2015.  FINDINGS: CT CHEST FINDINGS  Mediastinum/Lymph Nodes: Heart size is normal. There is no significant pericardial fluid, thickening or pericardial calcification. There is atherosclerosis of the thoracic aorta, the great vessels of the mediastinum and the coronary arteries, including calcified atherosclerotic plaque in the left main, left anterior descending and left circumflex coronary arteries. No pathologically enlarged mediastinal or hilar lymph nodes. Small hiatal hernia. No axillary lymphadenopathy.  Lungs/Pleura: 4 mm subpleural nodule in the anterior aspect of the right middle lobe (image 31 of series 205). A few other scattered 2-3 mm nodules are noted in the left upper lobe, but are highly nonspecific. Larger more suspicious appearing pulmonary nodules or masses are otherwise noted. There is a background of moderate centrilobular emphysema. No acute consolidative airspace disease. No pleural effusions. Bibasilar areas of subsegmental atelectasis  and/or scarring are noted dependently.  Musculoskeletal/Soft Tissues: There are no aggressive appearing lytic or blastic lesions noted in the visualized portions of the skeleton.  CT ABDOMEN FINDINGS  Hepatobiliary: Sub cm low-attenuation lesion segment 7 of the liver is too small to definitively characterize, but is statistically likely a tiny cyst. No other suspicious cystic or solid hepatic lesions are noted. Dependent high attenuation material within the gallbladder is most compatible with biliary sludge. No current findings to suggest an acute cholecystitis at this time.  Pancreas: No pancreatic mass. No pancreatic ductal dilatation. No pancreatic or peripancreatic fluid or inflammatory changes.  Spleen: Unremarkable.  Adrenals/Urinary Tract: Numerous well-defined low-attenuation lesions in the kidneys bilaterally, compatible with simple cysts, largest of which measures up to 7.9 cm in the lower pole of the left kidney. Moderate to severe bilateral hydroureteronephrosis in the visualized portions of the abdomen. Bilateral adrenal glands are normal in appearance.  Stomach/Bowel: Normal appearance of the stomach. No pathologic dilatation of visualized portions of small bowel or colon. A few scattered colonic diverticulae are noted, without surrounding inflammatory changes to suggest an acute diverticulitis in the visualized portions of the abdomen at this time.  Vascular/Lymphatic: Extensive atherosclerosis throughout the abdominal vasculature, without evidence of aneurysm or dissection in the  visualized abdomen. Numerous borderline enlarged and mildly enlarged retroperitoneal lymph nodes measuring up to 12 mm in short axis are noted in the left para-aortic nodal station.  Other: No significant volume of ascites in the visualized peritoneal cavity. No pneumoperitoneum.  Musculoskeletal: There are no aggressive appearing lytic or blastic lesions noted in the visualized portions of the skeleton. Compression  fracture of superior endplate of L2 with approximately 30% loss of central vertebral body height.  IMPRESSION: 1. While there is some lymphadenopathy noted in the retroperitoneum, this is nonspecific. This could be reactive given the patient's uterine prolapse and associated bilateral hydroureteronephrosis. However, given the patient's prominent bilateral adnexal structures, which could represent lymph nodes or ovarian tissue, underlying malignancy is suspected. Further clinical correlation is suggested. 2. No other definite evidence of extra nodal metastatic disease in the abdomen. 3. There are several small nonspecific pulmonary nodules in the lungs bilaterally. While metastatic disease is not excluded, it is not strongly favored. The largest of these nodules is a 4 mm subpleural nodule in the anterior aspect of the right middle lobe (image 31 of series 205). Attention on any future followup studies is recommended to ensure stability. At the very least, a repeat noncontrast chest CT should be performed in 12 months. 4. Atherosclerosis, including left main and 2 vessel coronary artery disease. Assessment for potential risk factor modification, dietary therapy or pharmacologic therapy may be warranted, if clinically indicated. 5. Biliary sludge lying dependently in the gallbladder. No current findings to suggest an acute cholecystitis at this time. 6. Additional incidental findings, as above.   Electronically Signed   By: Vinnie Langton M.D.   On: 08/03/2015 18:25   Dg Knee Complete 4 Views Right  07/31/2015   CLINICAL DATA:  Acute right knee pain after falling this morning. Initial encounter.  EXAM: RIGHT KNEE - COMPLETE 4+ VIEW  COMPARISON:  None.  FINDINGS: There is no evidence of fracture, dislocation, or joint effusion. There is no evidence of arthropathy or other focal bone abnormality. Soft tissues are unremarkable.  IMPRESSION: Normal right knee.   Electronically Signed   By: Marijo Conception, M.D.   On:  07/31/2015 16:01    Labs:  CBC:  Recent Labs  07/31/15 1519 08/01/15 0536 08/02/15 0520 08/04/15 0938  WBC 10.8* 10.8* 12.7* 11.3*  HGB 14.7 11.7* 11.1* 11.1*  HCT 42.9 34.9* 33.1* 33.1*  PLT 505* 558* 511* 459*    COAGS: No results for input(s): INR, APTT in the last 8760 hours.  BMP:  Recent Labs  08/01/15 0536 08/02/15 0520 08/03/15 0741 08/04/15 0534  NA 136 138 137 136  K 2.8* 2.9* 2.6* 3.5  CL 96* 100* 99* 98*  CO2 26 28 27 26   GLUCOSE 106* 99 87 86  BUN 9 <5* 5* 9  CALCIUM 12.0* 10.7* 8.8* 8.9  CREATININE 0.89 0.67 0.77 0.97  GFRNONAA >60 >60 >60 57*  GFRAA >60 >60 >60 >60    LIVER FUNCTION TESTS:  Recent Labs  07/31/15 1519 08/01/15 0536 08/02/15 0520 08/04/15 0534  BILITOT 0.8 0.7 0.6 1.2  AST 35 37 35 41  ALT 16 17 19 29   ALKPHOS 71 65 60 69  PROT 9.3* 7.8 6.9 6.6  ALBUMIN 3.5 2.9* 2.6* 2.3*    TUMOR MARKERS:  Recent Labs  08/03/15 1320  CEA 2.5    Assessment and Plan:  Pt fell at home--knee gave way Admitted through ED Work up revealing fever and hypercalcemia---unknown cause Imaging reveals lymphadenopathy Now scheduled  for Rt iliac Lymph node biopsy 10/11 in Radiology Risks and Benefits discussed with the patient's dtr including, but not limited to bleeding, infection, damage to adjacent structures or low yield requiring additional tests. All of her questions were answered, she is agreeable to proceed. Consent signed and in chart.   Thank you for this interesting consult.  I greatly enjoyed meeting ALIXANDRIA FRIEDT and look forward to participating in their care.  A copy of this report was sent to the requesting provider on this date.  Signed: Yee Gangi A 08/04/2015, 1:35 PM   I spent a total of 40 Minutes    in face to face in clinical consultation, greater than 50% of which was counseling/coordinating care for iliac LAN bx

## 2015-08-04 NOTE — Progress Notes (Signed)
PT Cancellation Note  Patient Details Name: Allison Chapman MRN: [id number] DOB: [birth year]   Cancelled Treatment:    Reason Eval/Treat Not Completed: Patient at procedure or test/unavailable;Other (comment) (multiple staff members with pt today and then a meal ).  Will try to see before her biopsy tomorrow.   Ramond Dial 08/04/2015, 5:59 PM   Mee Hives, PT MS Acute Rehab Dept. Number: [facility] [id number] and [place] [phone]

## 2015-08-04 NOTE — Progress Notes (Signed)
Subjective: Overnight: Patient spiked a fever at 1830 yesterday. Resolved spontaneously. Patient denied symptoms. Blood cultures collected and CBC added.   Patient seen this AM.  States feels well overall. A bit more lethargic than usual. Denies nausea, vomiting, chest pain, SOB, headache. Daughters have described a 2 day history of loose watery stools.   Objective: Vital signs in last 24 hours:  Temp (24hrs), Avg:99.5 F (37.5 C), Min:98.9 F (37.2 C), Max:100.4 F (38 C)   Filed Vitals:   08/03/15 1700 08/03/15 1829 08/03/15 2107 08/04/15 0544  BP: 147/84  133/82 145/76  Pulse:  100 94 98  Temp:  100.4 F (38 C) 98.9 F (37.2 C) 99.2 F (37.3 C)  TempSrc:  Oral Oral Oral  Resp:  17 17 17   Height:      Weight:      SpO2: 90%  96% 98%   Weight change:   Intake/Output Summary (Last 24 hours) at 08/04/15 1306 Last data filed at 08/03/15 1700  Gross per 24 hour  Intake      0 ml  Output      0 ml  Net      0 ml   Physical Exam  HENT:  Head: Normocephalic and atraumatic.  Mouth/Throat: No oropharyngeal exudate.  Eyes: Conjunctivae and EOM are normal. No scleral icterus.  Neck: Normal range of motion. No thyromegaly present.  Cardiovascular: Regular rhythm.  Exam reveals no gallop and no friction rub.   No murmur heard. Borderline tachycardic   Pulmonary/Chest: Effort normal. She has no wheezes. She has no rales.  Abdominal: Soft. Bowel sounds are normal. She exhibits no distension and no mass. There is no tenderness. There is no rebound and no guarding.  Musculoskeletal: Normal range of motion. She exhibits no edema.  Lymphadenopathy:    She has no cervical adenopathy.  Neurological: She is alert.  Oriented to person and place but not time   Skin: Skin is warm and dry.  Psychiatric: She has a normal mood and affect.  Slow to respond     Lab Results: Results for orders placed or performed during the hospital encounter of 07/31/15 (from the past 48 hour(s))    Basic metabolic panel     Status: Abnormal   Collection Time: 08/03/15  7:41 AM  Result Value Ref Range   Sodium 137 135 - 145 mmol/L   Potassium 2.6 (LL) 3.5 - 5.1 mmol/L    Comment: CRITICAL RESULT CALLED TO, READ BACK BY AND VERIFIED WITH: NEEDHAMJRN [id number] [id number]    Chloride 99 (L) 101 - 111 mmol/L   CO2 27 22 - 32 mmol/L   Glucose, Bld 87 65 - 99 mg/dL   BUN 5 (L) 6 - 20 mg/dL   Creatinine, Ser 0.77 0.44 - 1.00 mg/dL   Calcium 8.8 (L) 8.9 - 10.3 mg/dL   GFR calc non Af Amer >60 >60 mL/min   GFR calc Af Amer >60 >60 mL/min    Comment: (NOTE) The eGFR has been calculated using the CKD EPI equation. This calculation has not been validated in all clinical situations. eGFR's persistently <60 mL/min signify possible Chronic Kidney Disease.    Anion gap 11 5 - 15  Magnesium     Status: Abnormal   Collection Time: 08/03/15  7:41 AM  Result Value Ref Range   Magnesium 1.4 (L) 1.7 - 2.4 mg/dL  Phosphorus     Status: None   Collection Time: 08/03/15  7:41 AM  Result Value Ref  Range   Phosphorus 2.8 2.5 - 4.6 mg/dL  CEA     Status: None   Collection Time: 08/03/15  1:20 PM  Result Value Ref Range   CEA 2.5 0.0 - 4.7 ng/mL    Comment: (NOTE)       Roche ECLIA methodology       Nonsmokers  <3.9                                     Smokers     <5.6 Performed At: [facility] [place], [place] [id number] Lindon Romp MD [id number]   CA 125     Status: None   Collection Time: 08/03/15  1:20 PM  Result Value Ref Range   CA 125 14.9 0.0 - 38.1 U/mL    Comment: (NOTE) Roche ECLIA methodology Performed At: [facility] [address] [place], [place] [id number] Lindon Romp MD [id number]   Glucose, capillary     Status: Abnormal   Collection Time: 08/03/15  9:03 PM  Result Value Ref Range   Glucose-Capillary 100 (H) 65 - 99 mg/dL  Comprehensive metabolic panel     Status: Abnormal   Collection Time: 08/04/15  5:34 AM   Result Value Ref Range   Sodium 136 135 - 145 mmol/L   Potassium 3.5 3.5 - 5.1 mmol/L   Chloride 98 (L) 101 - 111 mmol/L   CO2 26 22 - 32 mmol/L   Glucose, Bld 86 65 - 99 mg/dL   BUN 9 6 - 20 mg/dL   Creatinine, Ser 0.97 0.44 - 1.00 mg/dL   Calcium 8.9 8.9 - 10.3 mg/dL   Total Protein 6.6 6.5 - 8.1 g/dL   Albumin 2.3 (L) 3.5 - 5.0 g/dL   AST 41 15 - 41 U/L   ALT 29 14 - 54 U/L   Alkaline Phosphatase 69 38 - 126 U/L   Total Bilirubin 1.2 0.3 - 1.2 mg/dL   GFR calc non Af Amer 57 (L) >60 mL/min   GFR calc Af Amer >60 >60 mL/min    Comment: (NOTE) The eGFR has been calculated using the CKD EPI equation. This calculation has not been validated in all clinical situations. eGFR's persistently <60 mL/min signify possible Chronic Kidney Disease.    Anion gap 12 5 - 15  Magnesium     Status: None   Collection Time: 08/04/15  5:34 AM  Result Value Ref Range   Magnesium 1.8 1.7 - 2.4 mg/dL  Phosphorus     Status: Abnormal   Collection Time: 08/04/15  5:34 AM  Result Value Ref Range   Phosphorus 2.1 (L) 2.5 - 4.6 mg/dL  Glucose, capillary     Status: None   Collection Time: 08/04/15  7:37 AM  Result Value Ref Range   Glucose-Capillary 83 65 - 99 mg/dL   Comment 1 Notify RN    Comment 2 Document in Chart   Culture, blood (routine x 2)     Status: None (Preliminary result)   Collection Time: 08/04/15  8:14 AM  Result Value Ref Range   Specimen Description BLOOD LEFT ANTECUBITAL    Special Requests IN PEDIATRIC BOTTLE 3CC    Culture PENDING    Report Status PENDING   Culture, blood (routine x 2)     Status: None (Preliminary result)   Collection Time: 08/04/15  8:24 AM  Result Value Ref Range  Specimen Description BLOOD BLOOD RIGHT FOREARM    Special Requests IN PEDIATRIC BOTTLE 3CC    Culture PENDING    Report Status PENDING   CBC with Differential/Platelet     Status: Abnormal   Collection Time: 08/04/15  9:38 AM  Result Value Ref Range   WBC 11.3 (H) 4.0 - 10.5 K/uL    RBC 3.75 (L) 3.87 - 5.11 MIL/uL   Hemoglobin 11.1 (L) 12.0 - 15.0 g/dL   HCT 33.1 (L) 36.0 - 46.0 %   MCV 88.3 78.0 - 100.0 fL   MCH 29.6 26.0 - 34.0 pg   MCHC 33.5 30.0 - 36.0 g/dL   RDW 15.9 (H) 11.5 - 15.5 %   Platelets 459 (H) 150 - 400 K/uL   Neutrophils Relative % 83 %   Neutro Abs 9.4 (H) 1.7 - 7.7 K/uL   Lymphocytes Relative 8 %   Lymphs Abs 0.9 0.7 - 4.0 K/uL   Monocytes Relative 8 %   Monocytes Absolute 0.8 0.1 - 1.0 K/uL   Eosinophils Relative 1 %   Eosinophils Absolute 0.1 0.0 - 0.7 K/uL   Basophils Relative 0 %   Basophils Absolute 0.0 0.0 - 0.1 K/uL   Micro Results: Recent Results (from the past 240 hour(s))  Urine culture     Status: None   Collection Time: 07/31/15  6:42 PM  Result Value Ref Range Status   Specimen Description URINE, CATHETERIZED  Final   Special Requests NONE  Final   Culture 7,000 COLONIES/mL INSIGNIFICANT GROWTH  Final   Report Status 08/01/2015 FINAL  Final  Culture, blood (routine x 2)     Status: None (Preliminary result)   Collection Time: 08/04/15  8:14 AM  Result Value Ref Range Status   Specimen Description BLOOD LEFT ANTECUBITAL  Final   Special Requests IN PEDIATRIC BOTTLE 3CC  Final   Culture PENDING  Incomplete   Report Status PENDING  Incomplete  Culture, blood (routine x 2)     Status: None (Preliminary result)   Collection Time: 08/04/15  8:24 AM  Result Value Ref Range Status   Specimen Description BLOOD BLOOD RIGHT FOREARM  Final   Special Requests IN PEDIATRIC BOTTLE 3CC  Final   Culture PENDING  Incomplete   Report Status PENDING  Incomplete   Studies/Results: Ct Chest W Contrast  08/03/2015   CLINICAL DATA:  69 year old female with clinical concern for malignancy. Vaginal bleeding. Hypercalcemia. History of uterine prolapse.  EXAM: CT CHEST AND ABDOMEN WITH CONTRAST  TECHNIQUE: Multidetector CT imaging of the chest and abdomen was performed following the standard protocol during bolus administration of intravenous  contrast.  CONTRAST:  133m OMNIPAQUE IOHEXOL 300 MG/ML  SOLN  COMPARISON:  Pelvic CT 08/02/2015.  FINDINGS: CT CHEST FINDINGS  Mediastinum/Lymph Nodes: Heart size is normal. There is no significant pericardial fluid, thickening or pericardial calcification. There is atherosclerosis of the thoracic aorta, the great vessels of the mediastinum and the coronary arteries, including calcified atherosclerotic plaque in the left main, left anterior descending and left circumflex coronary arteries. No pathologically enlarged mediastinal or hilar lymph nodes. Small hiatal hernia. No axillary lymphadenopathy.  Lungs/Pleura: 4 mm subpleural nodule in the anterior aspect of the right middle lobe (image 31 of series 205). A few other scattered 2-3 mm nodules are noted in the left upper lobe, but are highly nonspecific. Larger more suspicious appearing pulmonary nodules or masses are otherwise noted. There is a background of moderate centrilobular emphysema. No acute consolidative airspace disease. No  pleural effusions. Bibasilar areas of subsegmental atelectasis and/or scarring are noted dependently.  Musculoskeletal/Soft Tissues: There are no aggressive appearing lytic or blastic lesions noted in the visualized portions of the skeleton.  CT ABDOMEN FINDINGS  Hepatobiliary: Sub cm low-attenuation lesion segment 7 of the liver is too small to definitively characterize, but is statistically likely a tiny cyst. No other suspicious cystic or solid hepatic lesions are noted. Dependent high attenuation material within the gallbladder is most compatible with biliary sludge. No current findings to suggest an acute cholecystitis at this time.  Pancreas: No pancreatic mass. No pancreatic ductal dilatation. No pancreatic or peripancreatic fluid or inflammatory changes.  Spleen: Unremarkable.  Adrenals/Urinary Tract: Numerous well-defined low-attenuation lesions in the kidneys bilaterally, compatible with simple cysts, largest of which  measures up to 7.9 cm in the lower pole of the left kidney. Moderate to severe bilateral hydroureteronephrosis in the visualized portions of the abdomen. Bilateral adrenal glands are normal in appearance.  Stomach/Bowel: Normal appearance of the stomach. No pathologic dilatation of visualized portions of small bowel or colon. A few scattered colonic diverticulae are noted, without surrounding inflammatory changes to suggest an acute diverticulitis in the visualized portions of the abdomen at this time.  Vascular/Lymphatic: Extensive atherosclerosis throughout the abdominal vasculature, without evidence of aneurysm or dissection in the visualized abdomen. Numerous borderline enlarged and mildly enlarged retroperitoneal lymph nodes measuring up to 12 mm in short axis are noted in the left para-aortic nodal station.  Other: No significant volume of ascites in the visualized peritoneal cavity. No pneumoperitoneum.  Musculoskeletal: There are no aggressive appearing lytic or blastic lesions noted in the visualized portions of the skeleton. Compression fracture of superior endplate of L2 with approximately 30% loss of central vertebral body height.  IMPRESSION: 1. While there is some lymphadenopathy noted in the retroperitoneum, this is nonspecific. This could be reactive given the patient's uterine prolapse and associated bilateral hydroureteronephrosis. However, given the patient's prominent bilateral adnexal structures, which could represent lymph nodes or ovarian tissue, underlying malignancy is suspected. Further clinical correlation is suggested. 2. No other definite evidence of extra nodal metastatic disease in the abdomen. 3. There are several small nonspecific pulmonary nodules in the lungs bilaterally. While metastatic disease is not excluded, it is not strongly favored. The largest of these nodules is a 4 mm subpleural nodule in the anterior aspect of the right middle lobe (image 31 of series 205). Attention on  any future followup studies is recommended to ensure stability. At the very least, a repeat noncontrast chest CT should be performed in 12 months. 4. Atherosclerosis, including left main and 2 vessel coronary artery disease. Assessment for potential risk factor modification, dietary therapy or pharmacologic therapy may be warranted, if clinically indicated. 5. Biliary sludge lying dependently in the gallbladder. No current findings to suggest an acute cholecystitis at this time. 6. Additional incidental findings, as above.   Electronically Signed   By: Vinnie Langton M.D.   On: 08/03/2015 18:25   Ct Pelvis W Contrast  08/02/2015   CLINICAL DATA:  Vaginal bleeding.  Hypercalcemia.  Uterine prolapse.  EXAM: CT PELVIS WITH CONTRAST  TECHNIQUE: Multidetector CT imaging of the pelvis was performed using the standard protocol following the bolus administration of intravenous contrast.  CONTRAST:  185m OMNIPAQUE IOHEXOL 300 MG/ML  SOLN  COMPARISON:  None.  FINDINGS: I think there is complete prolapse of the vagina and uterus. The uterus is small, as expected at this age. Within the prolapse, I think that  the posterior portion of the bladder also comes along with it. Anterior portion of the bladder retains a more normal position. The ureters are dilated to the point where they enter the prolapse on their way to the posterior portion of the bladder. There is undoubtedly extrinsic compression of the ureters at this point.  Complicating this case, there are bilateral necrotic nodal masses along both external iliac chains, measuring approximately 4-5 cm in size. This is a very worrisome finding and suggests metastatic malignancy. At the upper portion of this pelvic scan, I think there is also a necrotic node measuring 15 mm in diameter to the left of the aorta on image 3.  There is advanced atherosclerosis of the aorta and the iliac and femoral vessels. There is ordinary degenerative change of the lower lumbar spine.  There is an old compression deformity of L4.  IMPRESSION: Complete prolapse of the uterus which also brings with it the posterior portion of the bladder. The central portion of the bladder is collapsed and the anterior portion of the bladder retains a more normal position. Both ureters are dilated due to extrinsic compression as they enter the prolapse on their way to the ureterovesical junctions in the portion of the bladder that is prolapsed.  Bilateral external iliac nodal masses with necrosis, 4-5 cm in diameter. This is very worrisome for metastatic malignancy. There is an additional necrotic node in the abdominal retroperitoneum to the left of the aorta on image 3.   Electronically Signed   By: Nelson Chimes M.D.   On: 08/02/2015 17:03   Ct Abdomen W Contrast  08/03/2015   CLINICAL DATA:  69 year old female with clinical concern for malignancy. Vaginal bleeding. Hypercalcemia. History of uterine prolapse.  EXAM: CT CHEST AND ABDOMEN WITH CONTRAST  TECHNIQUE: Multidetector CT imaging of the chest and abdomen was performed following the standard protocol during bolus administration of intravenous contrast.  CONTRAST:  178m OMNIPAQUE IOHEXOL 300 MG/ML  SOLN  COMPARISON:  Pelvic CT 08/02/2015.  FINDINGS: CT CHEST FINDINGS  Mediastinum/Lymph Nodes: Heart size is normal. There is no significant pericardial fluid, thickening or pericardial calcification. There is atherosclerosis of the thoracic aorta, the great vessels of the mediastinum and the coronary arteries, including calcified atherosclerotic plaque in the left main, left anterior descending and left circumflex coronary arteries. No pathologically enlarged mediastinal or hilar lymph nodes. Small hiatal hernia. No axillary lymphadenopathy.  Lungs/Pleura: 4 mm subpleural nodule in the anterior aspect of the right middle lobe (image 31 of series 205). A few other scattered 2-3 mm nodules are noted in the left upper lobe, but are highly nonspecific. Larger more  suspicious appearing pulmonary nodules or masses are otherwise noted. There is a background of moderate centrilobular emphysema. No acute consolidative airspace disease. No pleural effusions. Bibasilar areas of subsegmental atelectasis and/or scarring are noted dependently.  Musculoskeletal/Soft Tissues: There are no aggressive appearing lytic or blastic lesions noted in the visualized portions of the skeleton.  CT ABDOMEN FINDINGS  Hepatobiliary: Sub cm low-attenuation lesion segment 7 of the liver is too small to definitively characterize, but is statistically likely a tiny cyst. No other suspicious cystic or solid hepatic lesions are noted. Dependent high attenuation material within the gallbladder is most compatible with biliary sludge. No current findings to suggest an acute cholecystitis at this time.  Pancreas: No pancreatic mass. No pancreatic ductal dilatation. No pancreatic or peripancreatic fluid or inflammatory changes.  Spleen: Unremarkable.  Adrenals/Urinary Tract: Numerous well-defined low-attenuation lesions in the kidneys bilaterally,  compatible with simple cysts, largest of which measures up to 7.9 cm in the lower pole of the left kidney. Moderate to severe bilateral hydroureteronephrosis in the visualized portions of the abdomen. Bilateral adrenal glands are normal in appearance.  Stomach/Bowel: Normal appearance of the stomach. No pathologic dilatation of visualized portions of small bowel or colon. A few scattered colonic diverticulae are noted, without surrounding inflammatory changes to suggest an acute diverticulitis in the visualized portions of the abdomen at this time.  Vascular/Lymphatic: Extensive atherosclerosis throughout the abdominal vasculature, without evidence of aneurysm or dissection in the visualized abdomen. Numerous borderline enlarged and mildly enlarged retroperitoneal lymph nodes measuring up to 12 mm in short axis are noted in the left para-aortic nodal station.  Other:  No significant volume of ascites in the visualized peritoneal cavity. No pneumoperitoneum.  Musculoskeletal: There are no aggressive appearing lytic or blastic lesions noted in the visualized portions of the skeleton. Compression fracture of superior endplate of L2 with approximately 30% loss of central vertebral body height.  IMPRESSION: 1. While there is some lymphadenopathy noted in the retroperitoneum, this is nonspecific. This could be reactive given the patient's uterine prolapse and associated bilateral hydroureteronephrosis. However, given the patient's prominent bilateral adnexal structures, which could represent lymph nodes or ovarian tissue, underlying malignancy is suspected. Further clinical correlation is suggested. 2. No other definite evidence of extra nodal metastatic disease in the abdomen. 3. There are several small nonspecific pulmonary nodules in the lungs bilaterally. While metastatic disease is not excluded, it is not strongly favored. The largest of these nodules is a 4 mm subpleural nodule in the anterior aspect of the right middle lobe (image 31 of series 205). Attention on any future followup studies is recommended to ensure stability. At the very least, a repeat noncontrast chest CT should be performed in 12 months. 4. Atherosclerosis, including left main and 2 vessel coronary artery disease. Assessment for potential risk factor modification, dietary therapy or pharmacologic therapy may be warranted, if clinically indicated. 5. Biliary sludge lying dependently in the gallbladder. No current findings to suggest an acute cholecystitis at this time. 6. Additional incidental findings, as above.   Electronically Signed   By: Vinnie Langton M.D.   On: 08/03/2015 18:25   Medications: I have reviewed the patient's current medications. Scheduled Meds: . amLODipine  10 mg Oral Daily  . carvedilol  6.25 mg Oral BID WC  . enoxaparin (LOVENOX) injection  40 mg Subcutaneous Q24H  . Influenza  vac split quadrivalent PF  0.5 mL Intramuscular Tomorrow-1000   Continuous Infusions:  PRN Meds:.acetaminophen **OR** acetaminophen Assessment/Plan: Principal Problem:   Hypercalcemia Active Problems:   Essential hypertension   Hypokalemia   Vitamin D deficiency   Postmenopausal vaginal bleeding   Uterine prolapse   ##Severe, symptomatic hypercalcemia 2/2 to malignancy: Ca: 14.5 on admission.  Given patient's age concerning for hypercalcemia of malignancy, multiple myeloma, hyperparathyroidism. PTH low normal, excluding hyperparathyroidism. CT pelvis revealed necrotic external iliac lymph nodes concerning for malignancy. Hypercalcemia improving with bisphosphonate/calcitonin therapy. Patient's corrected Ca 10.2 today. -Repeat BMET in AM -PTH-related peptide pending  -1,25 dihydroxyvitamin D pending  -Will need outpatient bisphosphonate injections q3-4 weeks, schedule with short-stay -Will need followup in IM clinic in 1-2 weeks   ##Malignancy, Unknown Origin: CT scan of pelvis revealed bilateral necrotic external iliac lymph nodes. Subsequent chest and abdominal CT scan were equivocal. CEA antigen and CA-125 within normal limits but does not r/o ovarian cancer.  IR consulted for biopsy. -IR Biopsy  today  -PTHrp pending  ##Pulmonary Nodules: CT scan revealed non-specific pulmonary nodules -Re-scan in next 12 months.   ##Alerted Mental Status: Likely 2/2 to hypercalcemia. Also included in differential are UTI, stroke and IC bleed. Negative CT and normal neuro exam virtually exclude stoke or IC bleed. UTI unremarkable. -CBC pending  ##Fever: Patient spiked fever of 100.4 at 1830 yesterday. Resolved spontaneously. Patient asymptomatic.  -Add on CBC-11.3 -Blood cultures drawn and pending  ##Diarrhea: 2 day history of loose watery stools, particulary with straining to sit up. Likely secondary to IVF and nutrtion. -Immodium PRN   ##Hypertension: Hypertensive since admission. 145/76  today. Home amlodipine increased to 10 mg daily. Carvedilol added. -Amlodipine 10 mg daily -Carvedilol 6.25 BID  -Continue to monitor   #Hypomagnesemia: Likely 2/2 to overhydration due to aggressive IVF. Mg 1.8 this morning  -Replete PRN -Daily monitoring   ##Hypophosphatemia:  Likely 2/2 to overhydration due to aggressive  IVF. 2.1 this morning -Replete if < 2.0 PRN -Continue monitoring   ##Hypokalemia:  Likely 2/2 to overhydration due to aggressive IVF. K 3.5 (N) this morning. -Kdur 40 mEq PRN -Daily BMET  ##DVT PPx:  -Lovenox 40 SQ daily  ##Dispo: Dispostion deferred at this time, awaiting improvement of symptoms. Anticipated discharge in 1-2 days.   This is a Careers information officer Note.  The care of the patient was discussed with Dr. Daryll Drown and the assessment and plan formulated with their assistance.  Please see their attached note for official documentation of the daily encounter.   LOS: 4 days   Florentina Addison, Med Student 08/04/2015, 12:58 PM

## 2015-08-04 NOTE — Evaluation (Signed)
Occupational Therapy Evaluation Patient Details Name: Allison Chapman MRN: [id number] DOB: 26-Jun-1946 Today's Date: 08/04/2015    History of Present Illness Pt admitted following a fall and brief episode of altered mental status. CT abdomen/pelvis showed   bilateral external iliac nodal masses worrisome for metastatic malignancy. Patient also with prolapsed uterus (outside of body).   Clinical Impression   Patient presenting with decreased ADL and functional mobility independence secondary to above. Patient independent and staying by herself during the day PTA. Patient currently requires overall mod to total assist with ADLs and up to mod assist for functional transfers. Patient will benefit from acute OT to increase overall independence in the areas of ADLs, functional mobility, and overall safety in order to safely discharge home with HHOT and 24/7 supervision/assistance. Notified CM of needed HHOT and 3-in-1. HR flucated between 107-125 during session; RN aware. During session, NT present. This therapist took opportunity to educate NT on safest way to assist pt with transfers and ADLs secondary to pt with history of falls PTA.    Follow Up Recommendations  Home health OT;Supervision/Assistance - 24 hour    Equipment Recommendations  3 in 1 bedside comode    Recommendations for Other Services  None at this time   Precautions / Restrictions Precautions Precautions: Fall Precaution Comments: incontinence  Restrictions Weight Bearing Restrictions: No    Mobility Bed Mobility Overal bed mobility: Needs Assistance Bed Mobility: Rolling;Sidelying to Sit Rolling: Min assist Sidelying to sit: Min assist General bed mobility comments: Use of bed rails. Cues for safety and technique   Transfers Overall transfer level: Needs assistance Equipment used: Rolling walker (2 wheeled) Transfers: Sit to/from [facility] Sit to Stand: Mod assist Stand pivot transfers: Mod  assist General transfer comment: Multimodal cueing needed for transfers, safety, and technique.     Balance Overall balance assessment: Needs assistance Sitting-balance support: No upper extremity supported;Feet supported Sitting balance-Leahy Scale: Fair     Standing balance support: Bilateral upper extremity supported;During functional activity Standing balance-Leahy Scale: Poor    ADL Overall ADL's : Needs assistance/impaired Eating/Feeding: Minimal assistance Eating/Feeding Details (indicate cue type and reason): secondary to decreased cognition Grooming: Minimal assistance Grooming Details (indicate cue type and reason): secondary to decreased cognition Upper Body Bathing: Minimal assitance Upper Body Bathing Details (indicate cue type and reason): secondary to decreased cognition Lower Body Bathing: Total assistance Lower Body Bathing Details (indicate cue type and reason): secondary to decreased cognition Upper Body Dressing : Moderate assistance Upper Body Dressing Details (indicate cue type and reason): secondary to decreased cognition Lower Body Dressing: Total assistance Lower Body Dressing Details (indicate cue type and reason): secondary to decreased cognition Toilet Transfer: RW;Moderate assistance;BSC   Toileting- Clothing Manipulation and Hygiene: Total assistance     Tub/Shower Transfer Details (indicate cue type and reason): did not occur Functional mobility during ADLs: Moderate assistance;Rolling walker General ADL Comments: Pt found supine in bed with incontience of bowel. Pt engaged in bed mobility for therapist to assist with cleanup. Pt then sat EOB (HR=125). Pt with increased fatigue and started to lay back down. Therapist encouraged pt to sit back up and pt willing. Pt stood from EOB with mod assist using RW. Pt with another incontience of bowel upon standing. +2 assist needed for cleanup. Pt then transferred into recliner.     Pertinent Vitals/Pain Pain  Assessment: No/denies pain     Hand Dominance Right   Extremity/Trunk Assessment Upper Extremity Assessment Upper Extremity Assessment: Generalized weakness  Lower Extremity Assessment Lower Extremity Assessment: Generalized weakness   Cervical / Trunk Assessment Cervical / Trunk Assessment: Kyphotic   Communication Communication Communication: Expressive difficulties   Cognition Arousal/Alertness: Awake/alert Behavior During Therapy: Flat affect Overall Cognitive Status: Impaired/Different from baseline Area of Impairment: Attention;Memory;Following commands;Safety/judgement;Awareness;Problem solving     Memory: Decreased short-term memory Following Commands: Follows one step commands inconsistently;Follows one step commands with increased time Safety/Judgement: Decreased awareness of safety;Decreased awareness of deficits Awareness: Intellectual Problem Solving: Slow processing;Decreased initiation;Difficulty sequencing;Requires verbal cues;Requires tactile cues                Home Living Family/patient expects to be discharged to:: Private residence Living Arrangements: Children Available Help at Discharge: Family;Available 24 hours/day (PTA, pt was alone during the day while daughters worked) Type of Home: House Home Access: Stairs to enter Technical brewer of Steps: 3 Entrance Stairs-Rails: Right Home Layout: One level     Bathroom Shower/Tub: Tub/shower unit Shower/tub characteristics:  (unsure) Bathroom Toilet: Standard     Home Equipment: Cane - single point   Prior Functioning/Environment Level of Independence: Independent with assistive device(s)        Comments: Pt used cane PTA    OT Diagnosis: Generalized weakness   OT Problem List: Decreased strength;Decreased activity tolerance;Impaired balance (sitting and/or standing);Decreased cognition;Decreased safety awareness;Decreased knowledge of use of DME or AE;Decreased knowledge of  precautions   OT Treatment/Interventions: Self-care/ADL training;Therapeutic exercise;Energy conservation;DME and/or AE instruction;Therapeutic activities;Patient/family education;Balance training    OT Goals(Current goals can be found in the care plan section) Acute Rehab OT Goals Patient Stated Goal: none stated OT Goal Formulation: With family Time For Goal Achievement: 08/18/15 Potential to Achieve Goals: Good ADL Goals Pt Will Perform Grooming: with supervision;standing Pt Will Perform Lower Body Bathing: sit to/from stand;with supervision (using AE prn) Pt Will Perform Lower Body Dressing: with supervision;sit to/from stand (using AE prn) Pt Will Transfer to Toilet: with supervision;bedside commode;ambulating Pt Will Perform Tub/Shower Transfer: Tub transfer;3 in 1;rolling walker;ambulating;with supervision Additional ADL Goal #1: Pt will be supervision for functional mobility using RW prn  OT Frequency: Min 2X/week   Barriers to D/C: None known at this time   End of Session Equipment Utilized During Treatment: Surveyor, mining Communication: Mobility status;Other (comment) (increased HR during activity and incontience of bowel) Also notified NT AND RN that chair alarm did not seem to be working properly.   Activity Tolerance: Patient limited by fatigue Patient left: in chair;with call bell/phone within reach;with chair alarm set;with nursing/sitter in room;with family/visitor present   Time: 5956-3875 OT Time Calculation (min): 31 min Charges:  OT General Charges $OT Visit: 1 Procedure OT Evaluation $Initial OT Evaluation Tier I: 1 Procedure OT Treatments $Self Care/Home Management : 8-22 mins  Adaora Mchaney , MS, OTR/L, CLT Pager: [phone]  08/04/2015, 10:25 AM

## 2015-08-04 NOTE — Care Management Note (Signed)
Case Management Note  Patient Details  Name: Allison Chapman MRN: [id number] Date of Birth: Jan 02, 1946  Subjective/Objective:           CM following for progression and d/c planning.         Action/Plan: 08/04/2015 Met with pt and daughters, pt unable to plan however pt daughters selected [facility] for [facility] services. [facility] notified and will provide services. DME ordered and to be delivered to pt room today for possible d/c today.   Expected Discharge Date:       08/04/2015           Expected Discharge Plan:  [place]  In-House Referral:  NA  Discharge planning Services  CM Consult  Post Acute Care Choice:  Durable Medical Equipment Choice offered to:  Adult Children  DME Arranged:  3-N-1, Walker rolling DME Agency:  [place] Arranged:  PT, OT [place] Agency:  [place]  Status of Service:  Completed, signed off  Medicare Important Message Given:    Date Medicare IM Given:    Medicare IM give by:    Date Additional Medicare IM Given:    Additional Medicare Important Message give by:     If discussed at [place] of Stay Meetings, dates discussed:    Additional Comments:  Adron Bene, RN 08/04/2015, 10:28 AM

## 2015-08-04 NOTE — Progress Notes (Signed)
  Date: 08/04/2015  Patient name: Allison Chapman  Medical record number: [id number]  Date of birth: Mar 08, 1946   This patient's plan of care was discussed with the house staff. Please see Dr. Nena Alexander note for complete details. I concur with her findings.  Work up ongoing, concerning for malignancy.  Biopsy of pelvic lymph nodes tomorrow.    Sid Falcon, MD 08/04/2015, 5:25 PM

## 2015-08-04 NOTE — Progress Notes (Signed)
Lab reports positive blood culture of gram negative rods in aerobic bottle. MD notified. Will continue to monitor.

## 2015-08-04 NOTE — Care Management Important Message (Signed)
Important Message  Patient Details  Name: Allison Chapman MRN: [id number] Date of Birth: 12-21-1945   Medicare Important Message Given:  Yes-second notification given    Delorse Lek 08/04/2015, 2:30 PM

## 2015-08-05 ENCOUNTER — Inpatient Hospital Stay (HOSPITAL_COMMUNITY): Payer: Medicare PPO

## 2015-08-05 DIAGNOSIS — Z9889 Other specified postprocedural states: Secondary | ICD-10-CM

## 2015-08-05 DIAGNOSIS — A4150 Gram-negative sepsis, unspecified: Secondary | ICD-10-CM

## 2015-08-05 DIAGNOSIS — G9340 Encephalopathy, unspecified: Secondary | ICD-10-CM

## 2015-08-05 HISTORY — DX: Other specified postprocedural states: Z98.890

## 2015-08-05 LAB — BASIC METABOLIC PANEL
ANION GAP: 11 (ref 5–15)
BUN: 19 mg/dL (ref 6–20)
CALCIUM: 8.2 mg/dL — AB (ref 8.9–10.3)
CO2: 24 mmol/L (ref 22–32)
Chloride: 100 mmol/L — ABNORMAL LOW (ref 101–111)
Creatinine, Ser: 1.2 mg/dL — ABNORMAL HIGH (ref 0.44–1.00)
GFR, EST AFRICAN AMERICAN: 51 mL/min — AB (ref >60)
GFR, EST NON AFRICAN AMERICAN: 44 mL/min — AB (ref >60)
GLUCOSE: 98 mg/dL (ref 65–99)
Potassium: 3.8 mmol/L (ref 3.5–5.1)
Sodium: 135 mmol/L (ref 135–145)

## 2015-08-05 LAB — LACTIC ACID, PLASMA
Lactic Acid, Venous: 1 mmol/L (ref 0.5–2.0)
Lactic Acid, Venous: 1.1 mmol/L (ref 0.5–2.0)

## 2015-08-05 LAB — MAGNESIUM: MAGNESIUM: 1.6 mg/dL — AB (ref 1.7–2.4)

## 2015-08-05 LAB — PROTIME-INR
INR: 1.33 (ref 0.00–1.49)
Prothrombin Time: 16.6 seconds — ABNORMAL HIGH (ref 11.6–15.2)

## 2015-08-05 LAB — CBC
HCT: 29.5 % — ABNORMAL LOW (ref 36.0–46.0)
HEMOGLOBIN: 9.6 g/dL — AB (ref 12.0–15.0)
MCH: 28.9 pg (ref 26.0–34.0)
MCHC: 32.5 g/dL (ref 30.0–36.0)
MCV: 88.9 fL (ref 78.0–100.0)
Platelets: 387 10*3/uL (ref 150–400)
RBC: 3.32 MIL/uL — ABNORMAL LOW (ref 3.87–5.11)
RDW: 16.1 % — ABNORMAL HIGH (ref 11.5–15.5)
WBC: 8.7 10*3/uL (ref 4.0–10.5)

## 2015-08-05 LAB — PHOSPHORUS: PHOSPHORUS: 1.7 mg/dL — AB (ref 2.5–4.6)

## 2015-08-05 LAB — PROCALCITONIN: Procalcitonin: 0.77 ng/mL

## 2015-08-05 LAB — APTT: APTT: 50 s — AB (ref 24–37)

## 2015-08-05 MED ORDER — PIPERACILLIN-TAZOBACTAM 3.375 G IVPB
3.3750 g | Freq: Three times a day (TID) | INTRAVENOUS | Status: DC
  Administered 2015-08-05 – 2015-08-07 (×7): 3.375 g via INTRAVENOUS
  Filled 2015-08-05 (×9): qty 50

## 2015-08-05 MED ORDER — DEXTROSE 5 % IV SOLN
2.0000 g | INTRAVENOUS | Status: DC
  Administered 2015-08-05: 2 g via INTRAVENOUS
  Filled 2015-08-05: qty 2

## 2015-08-05 MED ORDER — FENTANYL CITRATE (PF) 100 MCG/2ML IJ SOLN
INTRAMUSCULAR | Status: AC | PRN
  Administered 2015-08-05: 50 ug via INTRAVENOUS

## 2015-08-05 MED ORDER — MIDAZOLAM HCL 2 MG/2ML IJ SOLN
INTRAMUSCULAR | Status: AC | PRN
  Administered 2015-08-05 (×2): 1 mg via INTRAVENOUS

## 2015-08-05 MED ORDER — MIDAZOLAM HCL 2 MG/2ML IJ SOLN
INTRAMUSCULAR | Status: AC
  Filled 2015-08-05: qty 6

## 2015-08-05 MED ORDER — SODIUM CHLORIDE 0.9 % IV BOLUS (SEPSIS)
1000.0000 mL | INTRAVENOUS | Status: AC
  Administered 2015-08-05 (×2): 1000 mL via INTRAVENOUS

## 2015-08-05 MED ORDER — K PHOS MONO-SOD PHOS DI & MONO 155-852-130 MG PO TABS
750.0000 mg | ORAL_TABLET | Freq: Three times a day (TID) | ORAL | Status: AC
  Administered 2015-08-05 (×2): 750 mg via ORAL
  Filled 2015-08-05 (×4): qty 3

## 2015-08-05 MED ORDER — MAGNESIUM SULFATE 2 GM/50ML IV SOLN
2.0000 g | Freq: Once | INTRAVENOUS | Status: AC
  Administered 2015-08-05: 2 g via INTRAVENOUS
  Filled 2015-08-05: qty 50

## 2015-08-05 MED ORDER — FENTANYL CITRATE (PF) 100 MCG/2ML IJ SOLN
INTRAMUSCULAR | Status: AC
  Filled 2015-08-05: qty 4

## 2015-08-05 NOTE — Progress Notes (Signed)
Patient ID: Allison Chapman, female   DOB: January 15, 1946, 69 y.o.   MRN: [id number]  Earlier this evening, 1/2 anerobic cultures grew gram negative rods so ceftriaxone was started. At that time [name] was afebrile, without complaints. Around 0500, RN Otila Kluver notified me that the patient's fever had trended upward to 103.2. I evaluated the patient at that time. She was a little sleepy but answering questions appropriately, denying any complaints at all, specifically cough, runny nose, chills, sweats, dysuria, abdominal pain. Her daughter was at the bedside who said she was feeling restless over the past few hours but did not have any other complaints. Her sister who had been visiting felt she was coming down with a cold over the last few days. Nurse Otila Kluver also notified me that the patient is incontinent of both stool and urine and her uterus is prolapsed. There have been reports her uterus was been bleeding in the past. When I saw her, she was febrile to 103, tachycardic to 100, and her pressure has dropped from 094-709G systolic earlier today to 100/60.  I fear 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 Other less likely considerations are influenza, viral URI, or perhaps drug-induced fever from pamidronate (this can cause transient fever in 18-39% of patients per [facility]).  She is currently on ceftriaxone but I will broaden to Zosyn now to cover Pseudomonas and likely narrow depending on culture results, as I most highly suspect this is E coli. Additionally, I will bolus her with 30cc/kg, check lactic acid and procalcitonin given concern for sepsis.  [name], MD

## 2015-08-05 NOTE — Progress Notes (Signed)
Pt arrived from IR. R lower iliac bandaid dry; intact. Pt current resting in bed. VS stable will continue to monitor pt.   Allison Chapman

## 2015-08-05 NOTE — Procedures (Signed)
Interventional Radiology Procedure Note  Procedure:  CT guided right iliac lymph node biopsy  Complications:  None  Estimated Blood Loss: < 10 mL  Necrotic right iliac lymph node mass sampled under CT guidance.  18 G core biopsy x 4 via 17 G needle.  Venetia Night. Kathlene Cote, M.D Pager:  [phone]

## 2015-08-05 NOTE — Progress Notes (Signed)
   08/04/15 1430  Clinical Encounter Type  Visited With Other (Comment)  Visit Type Spiritual support  Referral From Nurse  Spiritual Encounters  Spiritual Needs Emotional  Patient's friend/family member indicated they did not want a visit from chaplain; since patient had not specifically requested me, I acceded to her wishes.

## 2015-08-05 NOTE — Sedation Documentation (Signed)
Successful biopsy

## 2015-08-05 NOTE — Progress Notes (Signed)
Subjective:  Patient seen and examined today. Diarrhea has improved. Patient is somnolent, but denies any fever, chills, shortness of breath, cough, abdominal pain, dysuria, or suprapubic pain.   Overnight, patient spiked a fever of 103.2, blood cultures came back with gram negative rods, BP was soft and patient was tachycardic; however, she denied any complaints. She was started on Ceftriaxone which was later d/c'ed and switched to Zosyn for additional pseudomonal coverage and given appropriate fluid replacement with IVF 30 cc/kg. Lactic acid was normal, but procalcitonin was 0.77.   Objective: Vital signs in last 24 hours: Filed Vitals:   08/05/15 0613 08/05/15 0719 08/05/15 0746 08/05/15 0906  BP: 117/65 121/66 139/65 121/68  Pulse: 92 89 89 92  Temp: 99.8 F (37.7 C) 101.6 F (38.7 C) 99.6 F (37.6 C) 100.8 F (38.2 C)  TempSrc: Oral Oral Oral Oral  Resp: 19 22 20 18   Height:      Weight:      SpO2: 92% 96% 96% 100%   General: Vital signs reviewed. Patient is elderly, somnolent, in no acute distress and cooperative with exam.  Cardiovascular: Regular rate, regular rhythm, S1 normal, S2 normal. Pulmonary/Chest: Clear to auscultation bilaterally, no wheezes, rales, or rhonchi. Abdominal: Soft, non-tender, non-distended, BS + Extremities: No lower extremity edema bilaterally, pulses symmetric and intact bilaterally.  Skin: Warm, dry and intact. No rashes or erythema. Psychiatric: Normal mood and affect. Speech is slow but behavior is normal.    Weight change:   Intake/Output Summary (Last 24 hours) at 08/05/15 1407 Last data filed at 08/05/15 0650  Gross per 24 hour  Intake 1223.33 ml  Output      0 ml  Net 1223.33 ml    Lab Results: Basic Metabolic Panel:  Recent Labs Lab 08/04/15 0534 08/05/15 0635  NA 136 135  K 3.5 3.8  CL 98* 100*  CO2 26 24  GLUCOSE 86 98  BUN 9 19  CREATININE 0.97 1.20*  CALCIUM 8.9 8.2*  MG 1.8 1.6*  PHOS 2.1* 1.7*   Liver  Function Tests:  Recent Labs Lab 08/02/15 0520 08/04/15 0534  AST 35 41  ALT 19 29  ALKPHOS 60 69  BILITOT 0.6 1.2  PROT 6.9 6.6  ALBUMIN 2.6* 2.3*   CBC:  Recent Labs Lab 07/31/15 1519  08/04/15 0938 08/05/15 0635  WBC 10.8*  < > 11.3* 8.7  NEUTROABS 9.3*  --  9.4*  --   HGB 14.7  < > 11.1* 9.6*  HCT 42.9  < > 33.1* 29.5*  MCV 89.0  < > 88.3 88.9  PLT 505*  < > 459* 387  < > = values in this interval not displayed. Fasting Lipid Panel:  Recent Labs Lab 08/02/15 0520  CHOL 203*  HDL 42  LDLCALC 140*  TRIG 105  CHOLHDL 4.8   Thyroid Function Tests:  Recent Labs Lab 07/31/15 1927  TSH 0.422   Urinalysis:  Recent Labs Lab 07/31/15 1842  COLORURINE AMBER*  LABSPEC 1.015  PHURINE 6.0  GLUCOSEU NEGATIVE  HGBUR LARGE*  BILIRUBINUR NEGATIVE  KETONESUR 15*  PROTEINUR 100*  UROBILINOGEN 1.0  NITRITE NEGATIVE  LEUKOCYTESUR NEGATIVE    Micro Results: Recent Results (from the past 240 hour(s))  Urine culture     Status: None   Collection Time: 07/31/15  6:42 PM  Result Value Ref Range Status   Specimen Description URINE, CATHETERIZED  Final   Special Requests NONE  Final   Culture 7,000 COLONIES/mL INSIGNIFICANT GROWTH  Final  Report Status 08/01/2015 FINAL  Final  Culture, blood (routine x 2)     Status: None (Preliminary result)   Collection Time: 08/04/15  8:14 AM  Result Value Ref Range Status   Specimen Description BLOOD LEFT ANTECUBITAL  Final   Special Requests IN PEDIATRIC BOTTLE 3CC  Final   Culture NO GROWTH 1 DAY  Final   Report Status PENDING  Incomplete  Culture, blood (routine x 2)     Status: None (Preliminary result)   Collection Time: 08/04/15  8:24 AM  Result Value Ref Range Status   Specimen Description BLOOD RIGHT FOREARM  Final   Special Requests IN PEDIATRIC BOTTLE 3CC  Final   Culture  Setup Time   Final    GRAM NEGATIVE RODS IN PEDIATRIC BOTTLE CRITICAL RESULT CALLED TO, READ BACK BY AND VERIFIED WITH: TINA ISAACS  @1141  08/04/15 MKELLY    Culture PROTEUS MIRABILIS  Final   Report Status PENDING  Incomplete   Studies/Results: Ct Chest W Contrast  08/03/2015   CLINICAL DATA:  69 year old female with clinical concern for malignancy. Vaginal bleeding. Hypercalcemia. History of uterine prolapse.  EXAM: CT CHEST AND ABDOMEN WITH CONTRAST  TECHNIQUE: Multidetector CT imaging of the chest and abdomen was performed following the standard protocol during bolus administration of intravenous contrast.  CONTRAST:  137mL OMNIPAQUE IOHEXOL 300 MG/ML  SOLN  COMPARISON:  Pelvic CT 08/02/2015.  FINDINGS: CT CHEST FINDINGS  Mediastinum/Lymph Nodes: Heart size is normal. There is no significant pericardial fluid, thickening or pericardial calcification. There is atherosclerosis of the thoracic aorta, the great vessels of the mediastinum and the coronary arteries, including calcified atherosclerotic plaque in the left main, left anterior descending and left circumflex coronary arteries. No pathologically enlarged mediastinal or hilar lymph nodes. Small hiatal hernia. No axillary lymphadenopathy.  Lungs/Pleura: 4 mm subpleural nodule in the anterior aspect of the right middle lobe (image 31 of series 205). A few other scattered 2-3 mm nodules are noted in the left upper lobe, but are highly nonspecific. Larger more suspicious appearing pulmonary nodules or masses are otherwise noted. There is a background of moderate centrilobular emphysema. No acute consolidative airspace disease. No pleural effusions. Bibasilar areas of subsegmental atelectasis and/or scarring are noted dependently.  Musculoskeletal/Soft Tissues: There are no aggressive appearing lytic or blastic lesions noted in the visualized portions of the skeleton.  CT ABDOMEN FINDINGS  Hepatobiliary: Sub cm low-attenuation lesion segment 7 of the liver is too small to definitively characterize, but is statistically likely a tiny cyst. No other suspicious cystic or solid hepatic  lesions are noted. Dependent high attenuation material within the gallbladder is most compatible with biliary sludge. No current findings to suggest an acute cholecystitis at this time.  Pancreas: No pancreatic mass. No pancreatic ductal dilatation. No pancreatic or peripancreatic fluid or inflammatory changes.  Spleen: Unremarkable.  Adrenals/Urinary Tract: Numerous well-defined low-attenuation lesions in the kidneys bilaterally, compatible with simple cysts, largest of which measures up to 7.9 cm in the lower pole of the left kidney. Moderate to severe bilateral hydroureteronephrosis in the visualized portions of the abdomen. Bilateral adrenal glands are normal in appearance.  Stomach/Bowel: Normal appearance of the stomach. No pathologic dilatation of visualized portions of small bowel or colon. A few scattered colonic diverticulae are noted, without surrounding inflammatory changes to suggest an acute diverticulitis in the visualized portions of the abdomen at this time.  Vascular/Lymphatic: Extensive atherosclerosis throughout the abdominal vasculature, without evidence of aneurysm or dissection in the visualized abdomen. Numerous borderline  enlarged and mildly enlarged retroperitoneal lymph nodes measuring up to 12 mm in short axis are noted in the left para-aortic nodal station.  Other: No significant volume of ascites in the visualized peritoneal cavity. No pneumoperitoneum.  Musculoskeletal: There are no aggressive appearing lytic or blastic lesions noted in the visualized portions of the skeleton. Compression fracture of superior endplate of L2 with approximately 30% loss of central vertebral body height.  IMPRESSION: 1. While there is some lymphadenopathy noted in the retroperitoneum, this is nonspecific. This could be reactive given the patient's uterine prolapse and associated bilateral hydroureteronephrosis. However, given the patient's prominent bilateral adnexal structures, which could represent  lymph nodes or ovarian tissue, underlying malignancy is suspected. Further clinical correlation is suggested. 2. No other definite evidence of extra nodal metastatic disease in the abdomen. 3. There are several small nonspecific pulmonary nodules in the lungs bilaterally. While metastatic disease is not excluded, it is not strongly favored. The largest of these nodules is a 4 mm subpleural nodule in the anterior aspect of the right middle lobe (image 31 of series 205). Attention on any future followup studies is recommended to ensure stability. At the very least, a repeat noncontrast chest CT should be performed in 12 months. 4. Atherosclerosis, including left main and 2 vessel coronary artery disease. Assessment for potential risk factor modification, dietary therapy or pharmacologic therapy may be warranted, if clinically indicated. 5. Biliary sludge lying dependently in the gallbladder. No current findings to suggest an acute cholecystitis at this time. 6. Additional incidental findings, as above.   Electronically Signed   By: Vinnie Langton M.D.   On: 08/03/2015 18:25   Ct Abdomen W Contrast  08/03/2015   CLINICAL DATA:  69 year old female with clinical concern for malignancy. Vaginal bleeding. Hypercalcemia. History of uterine prolapse.  EXAM: CT CHEST AND ABDOMEN WITH CONTRAST  TECHNIQUE: Multidetector CT imaging of the chest and abdomen was performed following the standard protocol during bolus administration of intravenous contrast.  CONTRAST:  124mL OMNIPAQUE IOHEXOL 300 MG/ML  SOLN  COMPARISON:  Pelvic CT 08/02/2015.  FINDINGS: CT CHEST FINDINGS  Mediastinum/Lymph Nodes: Heart size is normal. There is no significant pericardial fluid, thickening or pericardial calcification. There is atherosclerosis of the thoracic aorta, the great vessels of the mediastinum and the coronary arteries, including calcified atherosclerotic plaque in the left main, left anterior descending and left circumflex coronary  arteries. No pathologically enlarged mediastinal or hilar lymph nodes. Small hiatal hernia. No axillary lymphadenopathy.  Lungs/Pleura: 4 mm subpleural nodule in the anterior aspect of the right middle lobe (image 31 of series 205). A few other scattered 2-3 mm nodules are noted in the left upper lobe, but are highly nonspecific. Larger more suspicious appearing pulmonary nodules or masses are otherwise noted. There is a background of moderate centrilobular emphysema. No acute consolidative airspace disease. No pleural effusions. Bibasilar areas of subsegmental atelectasis and/or scarring are noted dependently.  Musculoskeletal/Soft Tissues: There are no aggressive appearing lytic or blastic lesions noted in the visualized portions of the skeleton.  CT ABDOMEN FINDINGS  Hepatobiliary: Sub cm low-attenuation lesion segment 7 of the liver is too small to definitively characterize, but is statistically likely a tiny cyst. No other suspicious cystic or solid hepatic lesions are noted. Dependent high attenuation material within the gallbladder is most compatible with biliary sludge. No current findings to suggest an acute cholecystitis at this time.  Pancreas: No pancreatic mass. No pancreatic ductal dilatation. No pancreatic or peripancreatic fluid or inflammatory changes.  Spleen: Unremarkable.  Adrenals/Urinary Tract: Numerous well-defined low-attenuation lesions in the kidneys bilaterally, compatible with simple cysts, largest of which measures up to 7.9 cm in the lower pole of the left kidney. Moderate to severe bilateral hydroureteronephrosis in the visualized portions of the abdomen. Bilateral adrenal glands are normal in appearance.  Stomach/Bowel: Normal appearance of the stomach. No pathologic dilatation of visualized portions of small bowel or colon. A few scattered colonic diverticulae are noted, without surrounding inflammatory changes to suggest an acute diverticulitis in the visualized portions of the  abdomen at this time.  Vascular/Lymphatic: Extensive atherosclerosis throughout the abdominal vasculature, without evidence of aneurysm or dissection in the visualized abdomen. Numerous borderline enlarged and mildly enlarged retroperitoneal lymph nodes measuring up to 12 mm in short axis are noted in the left para-aortic nodal station.  Other: No significant volume of ascites in the visualized peritoneal cavity. No pneumoperitoneum.  Musculoskeletal: There are no aggressive appearing lytic or blastic lesions noted in the visualized portions of the skeleton. Compression fracture of superior endplate of L2 with approximately 30% loss of central vertebral body height.  IMPRESSION: 1. While there is some lymphadenopathy noted in the retroperitoneum, this is nonspecific. This could be reactive given the patient's uterine prolapse and associated bilateral hydroureteronephrosis. However, given the patient's prominent bilateral adnexal structures, which could represent lymph nodes or ovarian tissue, underlying malignancy is suspected. Further clinical correlation is suggested. 2. No other definite evidence of extra nodal metastatic disease in the abdomen. 3. There are several small nonspecific pulmonary nodules in the lungs bilaterally. While metastatic disease is not excluded, it is not strongly favored. The largest of these nodules is a 4 mm subpleural nodule in the anterior aspect of the right middle lobe (image 31 of series 205). Attention on any future followup studies is recommended to ensure stability. At the very least, a repeat noncontrast chest CT should be performed in 12 months. 4. Atherosclerosis, including left main and 2 vessel coronary artery disease. Assessment for potential risk factor modification, dietary therapy or pharmacologic therapy may be warranted, if clinically indicated. 5. Biliary sludge lying dependently in the gallbladder. No current findings to suggest an acute cholecystitis at this time.  6. Additional incidental findings, as above.   Electronically Signed   By: Vinnie Langton M.D.   On: 08/03/2015 18:25   Medications:  I have reviewed the patient's current medications. Prior to Admission:  Prescriptions prior to admission  Medication Sig Dispense Refill Last Dose  . amLODipine (NORVASC) 5 MG tablet Take 1 tablet (5 mg total) by mouth daily. 90 tablet 3 07/31/2015 at Unknown time  . chlorthalidone (HYGROTON) 25 MG tablet Take 4 tablets (100 mg total) by mouth daily. (Patient not taking: Reported on 07/31/2015) 45 tablet 5 Not Taking at Unknown time  . citalopram (CELEXA) 20 MG tablet Take 1 tablet (20 mg total) by mouth daily. (Patient not taking: Reported on 07/31/2015) 30 tablet 3 Not Taking at Unknown time  . pravastatin (PRAVACHOL) 20 MG tablet Take 1 tablet (20 mg total) by mouth daily. (Patient not taking: Reported on 07/31/2015) 30 tablet 3 Not Taking at Unknown time   Scheduled Meds: . diphenoxylate-atropine  1 tablet Oral QID  . enoxaparin (LOVENOX) injection  40 mg Subcutaneous Q24H  . Influenza vac split quadrivalent PF  0.5 mL Intramuscular Tomorrow-1000  . phosphorus  750 mg Oral TID  . piperacillin-tazobactam (ZOSYN)  IV  3.375 g Intravenous Q8H   Continuous Infusions:   PRN Meds:.acetaminophen **OR** acetaminophen  Assessment/Plan: Principal Problem:   Hypercalcemia Active Problems:   Essential hypertension   Hypokalemia   Vitamin D deficiency   Postmenopausal vaginal bleeding   Uterine prolapse   Lymphadenopathy  Severe Symptomatic Hypercalcemia 2/2 Malignancy: Corrected calcium 10.2>10.6>9.9 today. Vitamin D 1,25-dihydroxy normal, PTHrP still pending. Patient will receive continued IV bisphosphonate treatment as outpatient.  -Repeat BMET tomorrow am -PTH-related peptide pending (for humoral hypercalcemia of malignancy if elevated) -IV bisphosphonate once every 3-4 weeks as outpatient  Metastatic Malignancy of Unknown Origin: CT pelvis with contrast  bilateral external iliac nodal masses with necrosis, 4-5 cm in diameter. This is very worrisome for metastatic malignancy. There is an additional necrotic node in the abdominal retroperitoneum to the left of the aorta. CT abdomen again showed nonspecific lymphadenopathy in the retroperitoneum, which may be reactive given the patient's uterine prolapse and associated bilateral hydroureteronephrosis. However, given the patient's prominent bilateral adnexal structures, which could represent lymph nodes or ovarian tissue, underlying malignancy is suspected. This was discussed with Dr. Julien Nordmann with Hem/Onc who recommended biopsy for tissue diagnosis. CEA and CA-125 normal. IR was consulted for biopsy today.  -Right Iliac lymph node biopsy 10/11 with IR  Proteus Mirabilis Bacteremia: Overnight, patient spiked a fever of 103.2, blood cultures came back with gram negative rods, BP was soft and patient was tachycardic; however, she denied any complaints. She was started on Ceftriaxone which was later d/c'ed and switched to Zosyn for additional pseudomonal coverage and given appropriate fluid replacement with IVF 30 cc/kg. Lactic acid was normal, but procalcitonin was 0.77. BCx have grown proteus mirabilis.  -Continue Zosyn, await culture results  -BCx 10/10 >> Proteus Mirabilis -Repeat UA  Nonspecific Pulmonary Nodules: CT chest showed several small nonspecific pulmonary nodules in the lungs bilaterally. While metastatic disease is not excluded, it is not strongly favored. The largest of these nodules is a 4 mm subpleural nodule in the anterior aspect of the right middle lobe -Repeat noncontrast chest CT should be performed in 12 months.  HTN: 106/60-117/65 this morning.   -Hold amlodipine and carvedilol while pressures are soft -Continue to monitor  Hypokalemia: Likely secondary to massive IVF hydration. K 3.8 this morning. -Repeat BMET tomorrow am  Hypophosphatemia: Likely secondary to massive IVF  hydration. Phosphorus 1.7 this morning.  -Repeat phosphorus tomorrow am -Replaced with Kphos 750 mg TID x 3 hours  Hypomagnesemia: Likely secondary to massive IVF hydration. Magnesium 1.6 this morning.  -Repeat magnesium tomorrow am  -Replaced  DVT/PE ppx: Lovenox SQ QD  Dispo: Anticipated discharge today.  The patient does not have a current PCP (No primary care provider on file.) and does need an [facility] hospital follow-up appointment after discharge.  The patient does have transportation limitations that hinder transportation to clinic appointments.    LOS: 5 days   Osa Craver, DO PGY-1 Internal Medicine Resident Pager # [phone] 08/05/2015 2:07 PM

## 2015-08-05 NOTE — Progress Notes (Signed)
Subjective: Overnight: Patient developed a fever that peaked at 103.2 overnight. One of two aerobic blood cultures drawn on 10/10 grew gram negative rods. Patient was started on ceftriaxone for empiric coverage. This was broadened to Pip-Tazo (Zosyn) to cover for potential Pseudomonal infection. Patient was found to be tachycardic and have a soft BP of 100/60. She was given 500 mL bolus of LR with improvement of BP to 121/68. Morning BP medications were held.  A urinalysis, lactic acid and procalcitonin level was ordered  Patient seen this morning and appeared more lethargic than yesterday. Daughter states that she had a restless night 2/2 to fever. Patient denies headache, nausea, vomiting, dysuria. Daughter states diarrrhea has improved with Loperamide with last BM yesterday evening.    Objective: Vital signs in last 24 hours:  Temp (24hrs), Avg:100.9 F (38.3 C), Min:98.4 F (36.9 C), Max:103.2 F (39.6 C)   Filed Vitals:   08/05/15 0613 08/05/15 0719 08/05/15 0746 08/05/15 0906  BP: 117/65 121/66 139/65 121/68  Pulse: 92 89 89 92  Temp: 99.8 F (37.7 C) 101.6 F (38.7 C) 99.6 F (37.6 C) 100.8 F (38.2 C)  TempSrc: Oral Oral Oral Oral  Resp: 19 22 20 18   Height:      Weight:      SpO2: 92% 96% 96% 100%   Weight change:   Intake/Output Summary (Last 24 hours) at 08/05/15 1115 Last data filed at 08/05/15 0650  Gross per 24 hour  Intake 1343.33 ml  Output      0 ml  Net 1343.33 ml   Physical Exam  Constitutional:  Thin, ill appearing elderly woman laying in bed   HENT:  Head: Normocephalic and atraumatic.  Mouth/Throat: No oropharyngeal exudate.  Eyes: Conjunctivae and EOM are normal. No scleral icterus.  Neck: Normal range of motion.  Cardiovascular: Normal rate and regular rhythm.  Exam reveals no gallop and no friction rub.   No murmur heard. Pulmonary/Chest: Effort normal and breath sounds normal. She has no wheezes.  Abdominal: Soft. Bowel sounds are normal.  She exhibits no distension. There is no tenderness. There is no rebound and no guarding.  Musculoskeletal: Normal range of motion. She exhibits no edema.  Neurological:  Lethargic, oriented to person and place but not time  Skin: Skin is warm and dry.  Psychiatric:  Lethargic with mild confusion    Lab Results: Results for orders placed or performed during the hospital encounter of 07/31/15 (from the past 48 hour(s))  CEA     Status: None   Collection Time: 08/03/15  1:20 PM  Result Value Ref Range   CEA 2.5 0.0 - 4.7 ng/mL    Comment: (NOTE)       Roche ECLIA methodology       Nonsmokers  <3.9                                     Smokers     <5.6 Performed At: [facility] [place], [place] [id number] Lindon Romp MD TL:[id number]   CA 125     Status: None   Collection Time: 08/03/15  1:20 PM  Result Value Ref Range   CA 125 14.9 0.0 - 38.1 U/mL    Comment: (NOTE) Roche ECLIA methodology Performed At: [facility] [address] [place], [place] [id number] Lindon Romp MD MI:[id number]   Glucose, capillary     Status: Abnormal  Collection Time: 08/03/15  9:03 PM  Result Value Ref Range   Glucose-Capillary 100 (H) 65 - 99 mg/dL  Comprehensive metabolic panel     Status: Abnormal   Collection Time: 08/04/15  5:34 AM  Result Value Ref Range   Sodium 136 135 - 145 mmol/L   Potassium 3.5 3.5 - 5.1 mmol/L   Chloride 98 (L) 101 - 111 mmol/L   CO2 26 22 - 32 mmol/L   Glucose, Bld 86 65 - 99 mg/dL   BUN 9 6 - 20 mg/dL   Creatinine, Ser 0.97 0.44 - 1.00 mg/dL   Calcium 8.9 8.9 - 10.3 mg/dL   Total Protein 6.6 6.5 - 8.1 g/dL   Albumin 2.3 (L) 3.5 - 5.0 g/dL   AST 41 15 - 41 U/L   ALT 29 14 - 54 U/L   Alkaline Phosphatase 69 38 - 126 U/L   Total Bilirubin 1.2 0.3 - 1.2 mg/dL   GFR calc non Af Amer 57 (L) >60 mL/min   GFR calc Af Amer >60 >60 mL/min    Comment: (NOTE) The eGFR has been calculated using the CKD EPI equation. This  calculation has not been validated in all clinical situations. eGFR's persistently <60 mL/min signify possible Chronic Kidney Disease.    Anion gap 12 5 - 15  Magnesium     Status: None   Collection Time: 08/04/15  5:34 AM  Result Value Ref Range   Magnesium 1.8 1.7 - 2.4 mg/dL  Phosphorus     Status: Abnormal   Collection Time: 08/04/15  5:34 AM  Result Value Ref Range   Phosphorus 2.1 (L) 2.5 - 4.6 mg/dL  Glucose, capillary     Status: None   Collection Time: 08/04/15  7:37 AM  Result Value Ref Range   Glucose-Capillary 83 65 - 99 mg/dL   Comment 1 Notify RN    Comment 2 Document in Chart   Culture, blood (routine x 2)     Status: None (Preliminary result)   Collection Time: 08/04/15  8:14 AM  Result Value Ref Range   Specimen Description BLOOD LEFT ANTECUBITAL    Special Requests IN PEDIATRIC BOTTLE 3CC    Culture PENDING    Report Status PENDING   Culture, blood (routine x 2)     Status: None (Preliminary result)   Collection Time: 08/04/15  8:24 AM  Result Value Ref Range   Specimen Description BLOOD RIGHT FOREARM    Special Requests IN PEDIATRIC BOTTLE 3CC    Culture  Setup Time      GRAM NEGATIVE RODS IN PEDIATRIC BOTTLE CRITICAL RESULT CALLED TO, READ BACK BY AND VERIFIED WITH: TINA ISAACS @1141  08/04/15 MKELLY    Culture PROTEUS MIRABILIS    Report Status PENDING   CBC with Differential/Platelet     Status: Abnormal   Collection Time: 08/04/15  9:38 AM  Result Value Ref Range   WBC 11.3 (H) 4.0 - 10.5 K/uL   RBC 3.75 (L) 3.87 - 5.11 MIL/uL   Hemoglobin 11.1 (L) 12.0 - 15.0 g/dL   HCT 33.1 (L) 36.0 - 46.0 %   MCV 88.3 78.0 - 100.0 fL   MCH 29.6 26.0 - 34.0 pg   MCHC 33.5 30.0 - 36.0 g/dL   RDW 15.9 (H) 11.5 - 15.5 %   Platelets 459 (H) 150 - 400 K/uL   Neutrophils Relative % 83 %   Neutro Abs 9.4 (H) 1.7 - 7.7 K/uL   Lymphocytes Relative 8 %  Lymphs Abs 0.9 0.7 - 4.0 K/uL   Monocytes Relative 8 %   Monocytes Absolute 0.8 0.1 - 1.0 K/uL   Eosinophils  Relative 1 %   Eosinophils Absolute 0.1 0.0 - 0.7 K/uL   Basophils Relative 0 %   Basophils Absolute 0.0 0.0 - 0.1 K/uL  Basic metabolic panel     Status: Abnormal   Collection Time: 08/05/15  6:35 AM  Result Value Ref Range   Sodium 135 135 - 145 mmol/L   Potassium 3.8 3.5 - 5.1 mmol/L   Chloride 100 (L) 101 - 111 mmol/L   CO2 24 22 - 32 mmol/L   Glucose, Bld 98 65 - 99 mg/dL   BUN 19 6 - 20 mg/dL   Creatinine, Ser 1.20 (H) 0.44 - 1.00 mg/dL   Calcium 8.2 (L) 8.9 - 10.3 mg/dL   GFR calc non Af Amer 44 (L) >60 mL/min   GFR calc Af Amer 51 (L) >60 mL/min    Comment: (NOTE) The eGFR has been calculated using the CKD EPI equation. This calculation has not been validated in all clinical situations. eGFR's persistently <60 mL/min signify possible Chronic Kidney Disease.    Anion gap 11 5 - 15  CBC     Status: Abnormal   Collection Time: 08/05/15  6:35 AM  Result Value Ref Range   WBC 8.7 4.0 - 10.5 K/uL   RBC 3.32 (L) 3.87 - 5.11 MIL/uL   Hemoglobin 9.6 (L) 12.0 - 15.0 g/dL   HCT 29.5 (L) 36.0 - 46.0 %   MCV 88.9 78.0 - 100.0 fL   MCH 28.9 26.0 - 34.0 pg   MCHC 32.5 30.0 - 36.0 g/dL   RDW 16.1 (H) 11.5 - 15.5 %   Platelets 387 150 - 400 K/uL  Magnesium     Status: Abnormal   Collection Time: 08/05/15  6:35 AM  Result Value Ref Range   Magnesium 1.6 (L) 1.7 - 2.4 mg/dL  Phosphorus     Status: Abnormal   Collection Time: 08/05/15  6:35 AM  Result Value Ref Range   Phosphorus 1.7 (L) 2.5 - 4.6 mg/dL  Protime-INR     Status: Abnormal   Collection Time: 08/05/15  6:35 AM  Result Value Ref Range   Prothrombin Time 16.6 (H) 11.6 - 15.2 seconds   INR 1.33 0.00 - 1.49  APTT     Status: Abnormal   Collection Time: 08/05/15  6:35 AM  Result Value Ref Range   aPTT 50 (H) 24 - 37 seconds    Comment:        IF BASELINE aPTT IS ELEVATED, SUGGEST PATIENT RISK ASSESSMENT BE USED TO DETERMINE APPROPRIATE ANTICOAGULANT THERAPY.   Procalcitonin     Status: None   Collection Time:  08/05/15  6:35 AM  Result Value Ref Range   Procalcitonin 0.77 ng/mL    Comment:        Interpretation: PCT > 0.5 ng/mL and <= 2 ng/mL: Systemic infection (sepsis) is possible, but other conditions are known to elevate PCT as well. (NOTE)         ICU PCT Algorithm               Non ICU PCT Algorithm    ----------------------------     ------------------------------         PCT < 0.25 ng/mL                 PCT < 0.1 ng/mL     Stopping  of antibiotics            Stopping of antibiotics       strongly encouraged.               strongly encouraged.    ----------------------------     ------------------------------       PCT level decrease by               PCT < 0.25 ng/mL       >= 80% from peak PCT       OR PCT 0.25 - 0.5 ng/mL          Stopping of antibiotics                                             encouraged.     Stopping of antibiotics           encouraged.    ----------------------------     ------------------------------       PCT level decrease by              PCT >= 0.25 ng/mL       < 80% from peak PCT        AND PCT >= 0.5 ng/mL             Continuing antibiotics                                              encouraged.       Continuing antibiotics            encouraged.    ----------------------------     ------------------------------     PCT level increase compared          PCT > 0.5 ng/mL         with peak PCT AND          PCT >= 0.5 ng/mL             Escalation of antibiotics                                          strongly encouraged.      Escalation of antibiotics        strongly encouraged.   Lactic acid, plasma     Status: None   Collection Time: 08/05/15  7:44 AM  Result Value Ref Range   Lactic Acid, Venous 1.0 0.5 - 2.0 mmol/L  Lactic acid, plasma     Status: None   Collection Time: 08/05/15 10:02 AM  Result Value Ref Range   Lactic Acid, Venous 1.1 0.5 - 2.0 mmol/L   Micro Results: Recent Results (from the past 240 hour(s))  Urine culture      Status: None   Collection Time: 07/31/15  6:42 PM  Result Value Ref Range Status   Specimen Description URINE, CATHETERIZED  Final   Special Requests NONE  Final   Culture 7,000 COLONIES/mL INSIGNIFICANT GROWTH  Final   Report Status 08/01/2015 FINAL  Final  Culture, blood (routine x 2)     Status: None (Preliminary result)   Collection Time: 08/04/15  8:14 AM  Result Value Ref Range Status   Specimen Description BLOOD LEFT ANTECUBITAL  Final   Special Requests IN PEDIATRIC BOTTLE 3CC  Final   Culture PENDING  Incomplete   Report Status PENDING  Incomplete  Culture, blood (routine x 2)     Status: None (Preliminary result)   Collection Time: 08/04/15  8:24 AM  Result Value Ref Range Status   Specimen Description BLOOD RIGHT FOREARM  Final   Special Requests IN PEDIATRIC BOTTLE 3CC  Final   Culture  Setup Time   Final    GRAM NEGATIVE RODS IN PEDIATRIC BOTTLE CRITICAL RESULT CALLED TO, READ BACK BY AND VERIFIED WITH: TINA ISAACS @1141  08/04/15 MKELLY    Culture PROTEUS MIRABILIS  Final   Report Status PENDING  Incomplete   Studies/Results: Ct Chest W Contrast  08/03/2015   CLINICAL DATA:  69 year old female with clinical concern for malignancy. Vaginal bleeding. Hypercalcemia. History of uterine prolapse.  EXAM: CT CHEST AND ABDOMEN WITH CONTRAST  TECHNIQUE: Multidetector CT imaging of the chest and abdomen was performed following the standard protocol during bolus administration of intravenous contrast.  CONTRAST:  116m OMNIPAQUE IOHEXOL 300 MG/ML  SOLN  COMPARISON:  Pelvic CT 08/02/2015.  FINDINGS: CT CHEST FINDINGS  Mediastinum/Lymph Nodes: Heart size is normal. There is no significant pericardial fluid, thickening or pericardial calcification. There is atherosclerosis of the thoracic aorta, the great vessels of the mediastinum and the coronary arteries, including calcified atherosclerotic plaque in the left main, left anterior descending and left circumflex coronary arteries. No  pathologically enlarged mediastinal or hilar lymph nodes. Small hiatal hernia. No axillary lymphadenopathy.  Lungs/Pleura: 4 mm subpleural nodule in the anterior aspect of the right middle lobe (image 31 of series 205). A few other scattered 2-3 mm nodules are noted in the left upper lobe, but are highly nonspecific. Larger more suspicious appearing pulmonary nodules or masses are otherwise noted. There is a background of moderate centrilobular emphysema. No acute consolidative airspace disease. No pleural effusions. Bibasilar areas of subsegmental atelectasis and/or scarring are noted dependently.  Musculoskeletal/Soft Tissues: There are no aggressive appearing lytic or blastic lesions noted in the visualized portions of the skeleton.  CT ABDOMEN FINDINGS  Hepatobiliary: Sub cm low-attenuation lesion segment 7 of the liver is too small to definitively characterize, but is statistically likely a tiny cyst. No other suspicious cystic or solid hepatic lesions are noted. Dependent high attenuation material within the gallbladder is most compatible with biliary sludge. No current findings to suggest an acute cholecystitis at this time.  Pancreas: No pancreatic mass. No pancreatic ductal dilatation. No pancreatic or peripancreatic fluid or inflammatory changes.  Spleen: Unremarkable.  Adrenals/Urinary Tract: Numerous well-defined low-attenuation lesions in the kidneys bilaterally, compatible with simple cysts, largest of which measures up to 7.9 cm in the lower pole of the left kidney. Moderate to severe bilateral hydroureteronephrosis in the visualized portions of the abdomen. Bilateral adrenal glands are normal in appearance.  Stomach/Bowel: Normal appearance of the stomach. No pathologic dilatation of visualized portions of small bowel or colon. A few scattered colonic diverticulae are noted, without surrounding inflammatory changes to suggest an acute diverticulitis in the visualized portions of the abdomen at this  time.  Vascular/Lymphatic: Extensive atherosclerosis throughout the abdominal vasculature, without evidence of aneurysm or dissection in the visualized abdomen. Numerous borderline enlarged and mildly enlarged retroperitoneal lymph nodes measuring up to 12 mm in short axis are noted in the left para-aortic nodal station.  Other: No significant volume of ascites in the  visualized peritoneal cavity. No pneumoperitoneum.  Musculoskeletal: There are no aggressive appearing lytic or blastic lesions noted in the visualized portions of the skeleton. Compression fracture of superior endplate of L2 with approximately 30% loss of central vertebral body height.  IMPRESSION: 1. While there is some lymphadenopathy noted in the retroperitoneum, this is nonspecific. This could be reactive given the patient's uterine prolapse and associated bilateral hydroureteronephrosis. However, given the patient's prominent bilateral adnexal structures, which could represent lymph nodes or ovarian tissue, underlying malignancy is suspected. Further clinical correlation is suggested. 2. No other definite evidence of extra nodal metastatic disease in the abdomen. 3. There are several small nonspecific pulmonary nodules in the lungs bilaterally. While metastatic disease is not excluded, it is not strongly favored. The largest of these nodules is a 4 mm subpleural nodule in the anterior aspect of the right middle lobe (image 31 of series 205). Attention on any future followup studies is recommended to ensure stability. At the very least, a repeat noncontrast chest CT should be performed in 12 months. 4. Atherosclerosis, including left main and 2 vessel coronary artery disease. Assessment for potential risk factor modification, dietary therapy or pharmacologic therapy may be warranted, if clinically indicated. 5. Biliary sludge lying dependently in the gallbladder. No current findings to suggest an acute cholecystitis at this time. 6. Additional  incidental findings, as above.   Electronically Signed   By: Vinnie Langton M.D.   On: 08/03/2015 18:25   Ct Abdomen W Contrast  08/03/2015   CLINICAL DATA:  69 year old female with clinical concern for malignancy. Vaginal bleeding. Hypercalcemia. History of uterine prolapse.  EXAM: CT CHEST AND ABDOMEN WITH CONTRAST  TECHNIQUE: Multidetector CT imaging of the chest and abdomen was performed following the standard protocol during bolus administration of intravenous contrast.  CONTRAST:  140m OMNIPAQUE IOHEXOL 300 MG/ML  SOLN  COMPARISON:  Pelvic CT 08/02/2015.  FINDINGS: CT CHEST FINDINGS  Mediastinum/Lymph Nodes: Heart size is normal. There is no significant pericardial fluid, thickening or pericardial calcification. There is atherosclerosis of the thoracic aorta, the great vessels of the mediastinum and the coronary arteries, including calcified atherosclerotic plaque in the left main, left anterior descending and left circumflex coronary arteries. No pathologically enlarged mediastinal or hilar lymph nodes. Small hiatal hernia. No axillary lymphadenopathy.  Lungs/Pleura: 4 mm subpleural nodule in the anterior aspect of the right middle lobe (image 31 of series 205). A few other scattered 2-3 mm nodules are noted in the left upper lobe, but are highly nonspecific. Larger more suspicious appearing pulmonary nodules or masses are otherwise noted. There is a background of moderate centrilobular emphysema. No acute consolidative airspace disease. No pleural effusions. Bibasilar areas of subsegmental atelectasis and/or scarring are noted dependently.  Musculoskeletal/Soft Tissues: There are no aggressive appearing lytic or blastic lesions noted in the visualized portions of the skeleton.  CT ABDOMEN FINDINGS  Hepatobiliary: Sub cm low-attenuation lesion segment 7 of the liver is too small to definitively characterize, but is statistically likely a tiny cyst. No other suspicious cystic or solid hepatic lesions are  noted. Dependent high attenuation material within the gallbladder is most compatible with biliary sludge. No current findings to suggest an acute cholecystitis at this time.  Pancreas: No pancreatic mass. No pancreatic ductal dilatation. No pancreatic or peripancreatic fluid or inflammatory changes.  Spleen: Unremarkable.  Adrenals/Urinary Tract: Numerous well-defined low-attenuation lesions in the kidneys bilaterally, compatible with simple cysts, largest of which measures up to 7.9 cm in the lower pole of the left  kidney. Moderate to severe bilateral hydroureteronephrosis in the visualized portions of the abdomen. Bilateral adrenal glands are normal in appearance.  Stomach/Bowel: Normal appearance of the stomach. No pathologic dilatation of visualized portions of small bowel or colon. A few scattered colonic diverticulae are noted, without surrounding inflammatory changes to suggest an acute diverticulitis in the visualized portions of the abdomen at this time.  Vascular/Lymphatic: Extensive atherosclerosis throughout the abdominal vasculature, without evidence of aneurysm or dissection in the visualized abdomen. Numerous borderline enlarged and mildly enlarged retroperitoneal lymph nodes measuring up to 12 mm in short axis are noted in the left para-aortic nodal station.  Other: No significant volume of ascites in the visualized peritoneal cavity. No pneumoperitoneum.  Musculoskeletal: There are no aggressive appearing lytic or blastic lesions noted in the visualized portions of the skeleton. Compression fracture of superior endplate of L2 with approximately 30% loss of central vertebral body height.  IMPRESSION: 1. While there is some lymphadenopathy noted in the retroperitoneum, this is nonspecific. This could be reactive given the patient's uterine prolapse and associated bilateral hydroureteronephrosis. However, given the patient's prominent bilateral adnexal structures, which could represent lymph nodes or  ovarian tissue, underlying malignancy is suspected. Further clinical correlation is suggested. 2. No other definite evidence of extra nodal metastatic disease in the abdomen. 3. There are several small nonspecific pulmonary nodules in the lungs bilaterally. While metastatic disease is not excluded, it is not strongly favored. The largest of these nodules is a 4 mm subpleural nodule in the anterior aspect of the right middle lobe (image 31 of series 205). Attention on any future followup studies is recommended to ensure stability. At the very least, a repeat noncontrast chest CT should be performed in 12 months. 4. Atherosclerosis, including left main and 2 vessel coronary artery disease. Assessment for potential risk factor modification, dietary therapy or pharmacologic therapy may be warranted, if clinically indicated. 5. Biliary sludge lying dependently in the gallbladder. No current findings to suggest an acute cholecystitis at this time. 6. Additional incidental findings, as above.   Electronically Signed   By: Vinnie Langton M.D.   On: 08/03/2015 18:25   Medications: I have reviewed the patient's current medications. Scheduled Meds: . amLODipine  10 mg Oral Daily  . carvedilol  6.25 mg Oral BID WC  . diphenoxylate-atropine  1 tablet Oral QID  . enoxaparin (LOVENOX) injection  40 mg Subcutaneous Q24H  . Influenza vac split quadrivalent PF  0.5 mL Intramuscular Tomorrow-1000  . magnesium sulfate 1 - 4 g bolus IVPB  2 g Intravenous Once  . phosphorus  750 mg Oral TID  . piperacillin-tazobactam (ZOSYN)  IV  3.375 g Intravenous Q8H   Continuous Infusions:  PRN Meds:.acetaminophen **OR** acetaminophen Assessment/Plan: Principal Problem:   Hypercalcemia Active Problems:   Essential hypertension   Hypokalemia   Vitamin D deficiency   Postmenopausal vaginal bleeding   Uterine prolapse   Lymphadenopathy  ##Severe, symptomatic hypercalcemia 2/2 to malignancy: Ca: 14.5 on admission. Given  patient's age concerning for hypercalcemia of malignancy, multiple myeloma, hyperparathyroidism. PTH low normal, excluding hyperparathyroidism. CT pelvis revealed necrotic external iliac lymph nodes concerning for malignancy. Hypercalcemia improving with bisphosphonate/calcitonin therapy. Patients Calcium 8.2 today (uncorrected)  -Repeat BMET in AM -PTH-related peptide pending  -1,25 dihydroxyvitamin D: 58 (High Normal) -Bisphosphonate IV Therapy q 3 weeks. Appointment set up for 10/31 with [facility]     ##Malignancy, Unknown Origin: CT scan of pelvis revealed bilateral necrotic external iliac lymph nodes. Subsequent chest and  abdominal CT scan were equivocal. CEA antigen and CA-125 within normal limits but does not r/o ovarian cancer. IR consulted for biopsy. -IR Biopsy today  -PTHrp pending -Pap smear pending  ##Pulmonary Nodules: CT scan revealed non-specific pulmonary nodules -Re-scan in next 12 months.   ##Alerted Mental Status: Mental status has deteriorated over last 24 hours. In light of positive bacterial cultures, low BP and fever, must consider sepsis.  Patient given Ceftriaxone, subsequently broadened to Pip-tazo for psuedomonal coverage.  -Pip-Tazo; 3.375g q8h -Lactate 1.1 (10/11) -Procalcitonin elevated at 0.11 -WBC: 8.7, possibly due to hemodilution  ##Fever: Patient spiked fever of 1103.4 overnight. Blood Ccx growing gram negative rods x 1 (aerobic). Concern for bacteremia. Fever resolved with acetaminophen and antibiotics.  Patient given 500 cc bolus of LR for BP of 100/60 -Add on CBC-11.3 -Blood cultures drawn and pending -f/u Urinalysis  ##Diarrhea: 2 day history of loose watery stools, particulary with straining to sit up. Likely secondary to IVF and nutrtion. Seems to have resolved.  -Immodium PRN  -f/u C.diff Ag   ##Hypertension: Hypotensive last night, likely 2/2 to bacteremia. Hold morning anti-hypertensives -Amlodipine 10 mg daily- Held AM  dose -Carvedilol 6.25 BID -Held AM dose  -Continue to monitor   #Hypomagnesemia: Likely 2/2 to overhydration due to aggressive IVF. Mg 1.6 this morning  -Replete with Mg Sulfate 2g  -Daily monitoring   ##Hypophosphatemia: Likely 2/2 to overhydration due to aggressive IVF 1.7 this morning -Replete with K phos 750 TID -Continue monitoring   ##Hypokalemia: Likely 2/2 to overhydration due to aggressive IVF. K 3.8 (N) this morning. -Kdur 40 mEq PRN -Daily BMET  ##DVT PPx:  -Lovenox 40 SQ daily  ##Dispo: Dispostion deferred at this time, awaiting improvement of symptoms. Anticipated discharge in 1-2 days.  This is a Careers information officer Note.  The care of the patient was discussed with Dr. Daryll Drown and the assessment and plan formulated with their assistance.  Please see their attached note for official documentation of the daily encounter.   LOS: 5 days   Florentina Addison, Med Student 08/05/2015, 11:15 AM

## 2015-08-05 NOTE — Progress Notes (Signed)
  Date: 08/05/2015  Patient name: Allison Chapman  Medical record number: [id number]  Date of birth: [birth year]   This patient's plan of care was discussed with the house staff. Please see Dr. Nena Alexander note for complete details. I concur with her findings.  Due for biopsy of LN today.  BC 1/2 bottles with proteus mirabilis.  Zosyn was started overnight.    Sid Falcon, MD 08/05/2015, 4:10 PM

## 2015-08-05 NOTE — Progress Notes (Signed)
Attempted to see pt this am for OT.  Pt with fever of 103 and not feeling well.  Spoke with nursing and will defer treatment until tomorrow. Jinger Neighbors, [place] [phone]

## 2015-08-05 NOTE — Progress Notes (Signed)
PT Cancellation Note  Patient Details Name: Allison Chapman MRN: [id number] DOB: 01/20/1946   Cancelled Treatment:    Reason Eval/Treat Not Completed: Patient not medically ready. Pt unable to keep eyes open to participate. Pt very quiet and non-verbally responsive. Pt 's daughter present and reports she didn't get any sleep last night due to being restless and having a temp of 103.1. PT to return as able.   Kingsley Callander 08/05/2015, 2:30 PM   Kittie Plater, PT, DPT Pager #: [phone] Office #: [phone]

## 2015-08-05 NOTE — Care Management Important Message (Signed)
Important Message  Patient Details  Name: Allison Chapman MRN: [id number] Date of Birth: 26-Jun-1946   Medicare Important Message Given:  Yes-third notification given    RoyalRory Percy, RN 08/05/2015, 12:36 PM

## 2015-08-05 NOTE — Sedation Documentation (Signed)
Bandaid R lower iliac intact

## 2015-08-05 NOTE — Progress Notes (Addendum)
ANTIBIOTIC CONSULT NOTE - INITIAL  Pharmacy Consult for ceftriaxone Indication: bacteremia  No Known Allergies  Patient Measurements: Height: 5\' 5"  (165.1 cm) Weight: 133 lb 12.8 oz (60.691 kg) IBW/kg (Calculated) : 57 Adjusted Body Weight:   Vital Signs: Temp: 99.7 F (37.6 C) (10/10 2043) Temp Source: Oral (10/10 2043) BP: 111/59 mmHg (10/10 2043) Pulse Rate: 84 (10/10 2043) Intake/Output from previous day: 10/10 0701 - 10/11 0700 In: 460 [P.O.:460] Out: 0  Intake/Output from this shift:    Labs:  Recent Labs  08/02/15 0520 08/03/15 0741 08/04/15 0534 08/04/15 0938  WBC 12.7*  --   --  11.3*  HGB 11.1*  --   --  11.1*  PLT 511*  --   --  459*  CREATININE 0.67 0.77 0.97  --    Estimated Creatinine Clearance: 47.9 mL/min (by C-G formula based on Cr of 0.97). No results for input(s): VANCOTROUGH, VANCOPEAK, VANCORANDOM, GENTTROUGH, GENTPEAK, GENTRANDOM, TOBRATROUGH, TOBRAPEAK, TOBRARND, AMIKACINPEAK, AMIKACINTROU, AMIKACIN in the last 72 hours.   Microbiology: Recent Results (from the past 720 hour(s))  Urine culture     Status: None   Collection Time: 07/31/15  6:42 PM  Result Value Ref Range Status   Specimen Description URINE, CATHETERIZED  Final   Special Requests NONE  Final   Culture 7,000 COLONIES/mL INSIGNIFICANT GROWTH  Final   Report Status 08/01/2015 FINAL  Final  Culture, blood (routine x 2)     Status: None (Preliminary result)   Collection Time: 08/04/15  8:14 AM  Result Value Ref Range Status   Specimen Description BLOOD LEFT ANTECUBITAL  Final   Special Requests IN PEDIATRIC BOTTLE 3CC  Final   Culture PENDING  Incomplete   Report Status PENDING  Incomplete  Culture, blood (routine x 2)     Status: None (Preliminary result)   Collection Time: 08/04/15  8:24 AM  Result Value Ref Range Status   Specimen Description BLOOD RIGHT FOREARM  Final   Special Requests IN PEDIATRIC BOTTLE 3CC  Final   Culture  Setup Time   Final    GRAM NEGATIVE  RODS IN PEDIATRIC BOTTLE CRITICAL RESULT CALLED TO, READ BACK BY AND VERIFIED WITH: TINA ISAACS @1141  08/04/15 MKELLY    Culture PENDING  Incomplete   Report Status PENDING  Incomplete    Medical History: Past Medical History  Diagnosis Date  . Hypertension     Medications:  Anti-infectives    Start     Dose/Rate Route Frequency Ordered Stop   08/05/15 0100  cefTRIAXone (ROCEPHIN) 2 g in dextrose 5 % 50 mL IVPB     2 g 100 mL/hr over 30 Minutes Intravenous Every 24 hours 08/05/15 0006       Assessment: 19 yof presented to the hospital with a fall and AMS. Now with positive blood cultures to start ceftriaxone. Tmax is 100.4 and WBC is mildly elevated at 11.3 but is decreased from previous lab.   CTX 10/11>>  10/11 Blood - GNR 1/2 10/6 Urine - insignificant growth  Goal of Therapy:  Eradication of infection  Plan:  - Ceftriaxone 2gm IV Q24H - F/u C&S, clinical status - MD - Consider broadening coverage depending on clinical status and culture results  Hero Mccathern, Rande Lawman 08/05/2015,12:08 AM   Addendum: Temperature trending up to 103.2 and now tachycardiac with decreased BP. Broadening coverage to zosyn.   Plan: - Zosyn 3.375gm IV Q8H (4 hr inf) - F/u renal fxn, C&S, clinical status   Salome Arnt, PharmD, BCPS Pager #  [phone] 08/05/2015 5:51 AM

## 2015-08-06 DIAGNOSIS — C801 Malignant (primary) neoplasm, unspecified: Secondary | ICD-10-CM

## 2015-08-06 DIAGNOSIS — N939 Abnormal uterine and vaginal bleeding, unspecified: Secondary | ICD-10-CM

## 2015-08-06 LAB — CBC
HCT: 29.1 % — ABNORMAL LOW (ref 36.0–46.0)
HEMOGLOBIN: 9.7 g/dL — AB (ref 12.0–15.0)
MCH: 29.7 pg (ref 26.0–34.0)
MCHC: 33.3 g/dL (ref 30.0–36.0)
MCV: 89 fL (ref 78.0–100.0)
PLATELETS: 391 10*3/uL (ref 150–400)
RBC: 3.27 MIL/uL — ABNORMAL LOW (ref 3.87–5.11)
RDW: 16 % — AB (ref 11.5–15.5)
WBC: 10.6 10*3/uL — ABNORMAL HIGH (ref 4.0–10.5)

## 2015-08-06 LAB — PHOSPHORUS: PHOSPHORUS: 3.2 mg/dL (ref 2.5–4.6)

## 2015-08-06 LAB — CULTURE, BLOOD (ROUTINE X 2)

## 2015-08-06 LAB — BASIC METABOLIC PANEL
Anion gap: 12 (ref 5–15)
BUN: 16 mg/dL (ref 6–20)
CALCIUM: 7.5 mg/dL — AB (ref 8.9–10.3)
CO2: 23 mmol/L (ref 22–32)
CREATININE: 1.13 mg/dL — AB (ref 0.44–1.00)
Chloride: 99 mmol/L — ABNORMAL LOW (ref 101–111)
GFR, EST AFRICAN AMERICAN: 55 mL/min — AB (ref >60)
GFR, EST NON AFRICAN AMERICAN: 48 mL/min — AB (ref >60)
Glucose, Bld: 94 mg/dL (ref 65–99)
Potassium: 3 mmol/L — ABNORMAL LOW (ref 3.5–5.1)
SODIUM: 134 mmol/L — AB (ref 135–145)

## 2015-08-06 LAB — MAGNESIUM: MAGNESIUM: 2 mg/dL (ref 1.7–2.4)

## 2015-08-06 LAB — PTH-RELATED PEPTIDE

## 2015-08-06 MED ORDER — POTASSIUM CHLORIDE CRYS ER 20 MEQ PO TBCR
40.0000 meq | EXTENDED_RELEASE_TABLET | Freq: Two times a day (BID) | ORAL | Status: AC
  Administered 2015-08-06 (×2): 40 meq via ORAL
  Filled 2015-08-06 (×2): qty 2

## 2015-08-06 NOTE — Progress Notes (Signed)
Physical Therapy Treatment Patient Details Name: Allison Chapman MRN: [id number] DOB: 02-14-1946 Today's Date: 08/06/2015    History of Present Illness Pt admitted following a fall and brief episode of altered mental status. CT abdomen/pelvis showed   bilateral external iliac nodal masses worrisome for metastatic malignancy. Patient also with prolapsed uterus (outside of body).    PT Comments    Pt extremely debilitated and fatigues quickly. She remains to have a flat affect as well. Pt with poor activity tolerance. Daughter reports that her and her brother will be able to provide the min/moderate level of assist she currently requires at this time.   Follow Up Recommendations  Home health PT;Supervision for mobility/OOB     Equipment Recommendations  Rolling walker with 5" wheels    Recommendations for Other Services       Precautions / Restrictions Precautions Precautions: Fall Precaution Comments: incontinence, prolapsed uterus Restrictions Weight Bearing Restrictions: No    Mobility  Bed Mobility Overal bed mobility: Needs Assistance Bed Mobility: Rolling;Sidelying to Sit Rolling: Min assist Sidelying to sit: Mod assist       General bed mobility comments: pt recieved sitting EOB with OT with stool incontinence  Transfers Overall transfer level: Needs assistance Equipment used: Rolling walker (2 wheeled) Transfers: Sit to/from Stand Sit to Stand: Mod assist Stand pivot transfers: Mod assist;+2 physical assistance       General transfer comment: multimodal cueing, v/c's fro safe hand placement, completed 5 trials of standing  Ambulation/Gait Ambulation/Gait assistance: Mod assist Ambulation Distance (Feet): 6 Feet Assistive device: Rolling walker (2 wheeled) Gait Pattern/deviations: Step-to pattern Gait velocity: slow   General Gait Details: max encouragement required as pt extremely debilitated, mild SOB, SpO2 at >92%   Stairs             Wheelchair Mobility    Modified Rankin (Stroke Patients Only)       Balance Overall balance assessment: Needs assistance Sitting-balance support: Bilateral upper extremity supported Sitting balance-Leahy Scale: Fair     Standing balance support: Bilateral upper extremity supported Standing balance-Leahy Scale: Poor Standing balance comment: needs physical assist to maintain standing. only able to stand for 30 seconds at a time                    Cognition Arousal/Alertness: Awake/alert Behavior During Therapy: Flat affect Overall Cognitive Status: Impaired/Different from baseline Area of Impairment: Awareness   Current Attention Level: Sustained Memory: Decreased short-term memory Following Commands: Follows one step commands with increased time     Problem Solving: Slow processing;Requires verbal cues      Exercises      General Comments General comments (skin integrity, edema, etc.): pt with noted sore on L side of prolapsed uterus      Pertinent Vitals/Pain Pain Assessment: No/denies pain    Home Living                      Prior Function            PT Goals (current goals can now be found in the care plan section) Progress towards PT goals: Progressing toward goals    Frequency  Min 3X/week    PT Plan Current plan remains appropriate    Co-evaluation PT/OT/SLP Co-Evaluation/Treatment: Yes Reason for Co-Treatment: For patient/therapist safety PT goals addressed during session: Mobility/safety with mobility OT goals addressed during session: ADL's and self-care     End of Session Equipment Utilized During Treatment: Gait belt Activity  Tolerance: Patient limited by fatigue Patient left: in chair;with family/visitor present;with call bell/phone within reach     Time: 0370-4888 PT Time Calculation (min) (ACUTE ONLY): 26 min  Charges:  $Gait Training: 8-22 mins                    G Codes:      Kingsley Callander 08/06/2015, 4:12 PM   Kittie Plater, PT, DPT Pager #: [phone] Office #: [phone]

## 2015-08-06 NOTE — Progress Notes (Signed)
Subjective:  Patient seen and examined today. No acute events overnight. Patient denies any fever, chills, shortness of breath, cough, abdominal pain, dysuria or diarrhea. Her biopsy went well yesterday and she denies any pain. Patient is going to try to eat her breakfast this morning.   Objective: Vital signs in last 24 hours: Filed Vitals:   08/05/15 1830 08/05/15 1902 08/05/15 2028 08/06/15 0435  BP: 136/71 134/84 120/65 119/68  Pulse: 97 92 93 93  Temp: 97.9 F (36.6 C) 98.1 F (36.7 C) 97.8 F (36.6 C) 98.2 F (36.8 C)  TempSrc: Axillary Axillary Oral Oral  Resp: 18 18 19 20   Height:      Weight:   135 lb 2.3 oz (61.3 kg)   SpO2: 100% 100% 93% 93%   General: Vital signs reviewed. Patient is elderly, somnolent, in no acute distress and cooperative with exam.  Cardiovascular: Regular rate, regular rhythm, S1 normal, S2 normal. Pulmonary/Chest: Clear to auscultation bilaterally, no wheezes, rales, or rhonchi. Abdominal: Soft, non-tender, non-distended, BS + GU: Grade 4 prolapsed uterus with strong urine odor on exam.  Extremities: No lower extremity edema bilaterally, pulses symmetric and intact bilaterally.  Skin: Warm, dry and intact. No rashes or erythema. Psychiatric: Normal mood and affect. Speech is slow but behavior is normal.    Weight change: 1 lb 5.5 oz (0.609 kg)  Intake/Output Summary (Last 24 hours) at 08/06/15 0843 Last data filed at 08/06/15 0611  Gross per 24 hour  Intake    270 ml  Output      0 ml  Net    270 ml    Lab Results: Basic Metabolic Panel:  Recent Labs Lab 08/05/15 0635 08/06/15 0541  NA 135 134*  K 3.8 3.0*  CL 100* 99*  CO2 24 23  GLUCOSE 98 94  BUN 19 16  CREATININE 1.20* 1.13*  CALCIUM 8.2* 7.5*  MG 1.6* 2.0  PHOS 1.7* 3.2   Liver Function Tests:  Recent Labs Lab 08/02/15 0520 08/04/15 0534  AST 35 41  ALT 19 29  ALKPHOS 60 69  BILITOT 0.6 1.2  PROT 6.9 6.6  ALBUMIN 2.6* 2.3*   CBC:  Recent Labs Lab  07/31/15 1519  08/04/15 0938 08/05/15 0635 08/06/15 0541  WBC 10.8*  < > 11.3* 8.7 10.6*  NEUTROABS 9.3*  --  9.4*  --   --   HGB 14.7  < > 11.1* 9.6* 9.7*  HCT 42.9  < > 33.1* 29.5* 29.1*  MCV 89.0  < > 88.3 88.9 89.0  PLT 505*  < > 459* 387 391  < > = values in this interval not displayed. Fasting Lipid Panel:  Recent Labs Lab 08/02/15 0520  CHOL 203*  HDL 42  LDLCALC 140*  TRIG 105  CHOLHDL 4.8   Thyroid Function Tests:  Recent Labs Lab 07/31/15 1927  TSH 0.422   Urinalysis:  Recent Labs Lab 07/31/15 1842  COLORURINE AMBER*  LABSPEC 1.015  PHURINE 6.0  GLUCOSEU NEGATIVE  HGBUR LARGE*  BILIRUBINUR NEGATIVE  KETONESUR 15*  PROTEINUR 100*  UROBILINOGEN 1.0  NITRITE NEGATIVE  LEUKOCYTESUR NEGATIVE    Micro Results: Recent Results (from the past 240 hour(s))  Urine culture     Status: None   Collection Time: 07/31/15  6:42 PM  Result Value Ref Range Status   Specimen Description URINE, CATHETERIZED  Final   Special Requests NONE  Final   Culture 7,000 COLONIES/mL INSIGNIFICANT GROWTH  Final   Report Status 08/01/2015 FINAL  Final  Culture, blood (routine x 2)     Status: None (Preliminary result)   Collection Time: 08/04/15  8:14 AM  Result Value Ref Range Status   Specimen Description BLOOD LEFT ANTECUBITAL  Final   Special Requests IN PEDIATRIC BOTTLE 3CC  Final   Culture NO GROWTH 1 DAY  Final   Report Status PENDING  Incomplete  Culture, blood (routine x 2)     Status: None (Preliminary result)   Collection Time: 08/04/15  8:24 AM  Result Value Ref Range Status   Specimen Description BLOOD RIGHT FOREARM  Final   Special Requests IN PEDIATRIC BOTTLE 3CC  Final   Culture  Setup Time   Final    GRAM NEGATIVE RODS IN PEDIATRIC BOTTLE CRITICAL RESULT CALLED TO, READ BACK BY AND VERIFIED WITH: TINA ISAACS @1141  08/04/15 MKELLY    Culture PROTEUS MIRABILIS  Final   Report Status PENDING  Incomplete   Studies/Results: Ct Biopsy  08/05/2015   CLINICAL DATA:  Necrotic lymphadenopathy of the iliac lymph node chains bilaterally. EXAM: CT GUIDED CORE BIOPSY OF RIGHT ILIAC LYMPH NODE ANESTHESIA/SEDATION: 2.0  Mg IV Versed; 50 mcg IV Fentanyl Total Moderate Sedation Time: 12 minutes. PROCEDURE: The procedure risks, benefits, and alternatives were explained to the patient. Questions regarding the procedure were encouraged and answered. The patient understands and consents to the procedure. A time-out was performed prior to the procedure. The right lower abdominal wall was prepped with Betadine in a sterile fashion, and a sterile drape was applied covering the operative field. A sterile gown and sterile gloves were used for the procedure. Local anesthesia was provided with 1% Lidocaine. CT was performed in a supine position. Under CT guidance, a 17 gauge needle was advanced from an anterior approach into the right pelvis. After confirming needle tip position, a total of 4 separate coaxial 18 gauge core biopsy samples were obtained and submitted in saline. The outer needle was removed and additional CT performed. COMPLICATIONS: None FINDINGS: Core biopsy was performed at the level of enlarged necrotic lymph nodes in the right iliac chain. Anterior approach was performed through the level of the right iliacus muscle. Solid tissue and necrotic appearing tissue was obtained. IMPRESSION: CT-guided core biopsy performed of necrotic lymph node mass in the right iliac chain. Electronically Signed   By: Aletta Edouard M.D.   On: 08/05/2015 17:08   Medications:  I have reviewed the patient's current medications. Prior to Admission:  Prescriptions prior to admission  Medication Sig Dispense Refill Last Dose  . amLODipine (NORVASC) 5 MG tablet Take 1 tablet (5 mg total) by mouth daily. 90 tablet 3 07/31/2015 at Unknown time  . chlorthalidone (HYGROTON) 25 MG tablet Take 4 tablets (100 mg total) by mouth daily. (Patient not taking: Reported on 07/31/2015) 45 tablet 5  Not Taking at Unknown time  . citalopram (CELEXA) 20 MG tablet Take 1 tablet (20 mg total) by mouth daily. (Patient not taking: Reported on 07/31/2015) 30 tablet 3 Not Taking at Unknown time  . pravastatin (PRAVACHOL) 20 MG tablet Take 1 tablet (20 mg total) by mouth daily. (Patient not taking: Reported on 07/31/2015) 30 tablet 3 Not Taking at Unknown time   Scheduled Meds: . diphenoxylate-atropine  1 tablet Oral QID  . enoxaparin (LOVENOX) injection  40 mg Subcutaneous Q24H  . Influenza vac split quadrivalent PF  0.5 mL Intramuscular Tomorrow-1000  . phosphorus  750 mg Oral TID  . piperacillin-tazobactam (ZOSYN)  IV  3.375 g Intravenous Q8H  .  potassium chloride  40 mEq Oral BID   Continuous Infusions:   PRN Meds:.acetaminophen **OR** acetaminophen Assessment/Plan: Principal Problem:   Hypercalcemia Active Problems:   Essential hypertension   Hypokalemia   Vitamin D deficiency   Postmenopausal vaginal bleeding   Uterine prolapse   Lymphadenopathy   Gram negative sepsis (HCC)  Severe Symptomatic Hypercalcemia 2/2 Malignancy: Normalized. Corrected calcium 9.2 today. Vitamin D 1,25-dihydroxy normal, PTHrP still pending. Patient will receive continued IV bisphosphonate treatment as outpatient.  -Repeat BMET tomorrow am -PTH-related peptide still pending (for humoral hypercalcemia of malignancy if elevated) -IV bisphosphonate once every 3-4 weeks as outpatient  Metastatic Malignancy of Unknown Origin: CT pelvis with contrast bilateral external iliac nodal masses with necrosis, 4-5 cm in diameter. This is very worrisome for metastatic malignancy. CEA and CA-125 normal. Patient went for right external iliac nodal biopsy yesterday. -Surgical pathology pending   Proteus Mirabilis Bacteremia: Lactic acid was normal, procalcitonin 0.77. BCx have grown proteus mirabilis.  -Continue Zosyn, await culture results, tailor therapy -BCx 10/10 >> Proteus Mirabilis -Repeat UA  Nonspecific  Pulmonary Nodules: CT chest showed several small nonspecific pulmonary nodules in the lungs bilaterally. While metastatic disease is not excluded, it is not strongly favored. The largest of these nodules is a 4 mm subpleural nodule in the anterior aspect of the right middle lobe -Repeat noncontrast chest CT should be performed in 12 months.  HTN: 119/68 this morning.   -Hold amlodipine and carvedilol while pressures are soft -Continue to monitor  Hypokalemia: K 3.0 this morning. -Repeat BMET tomorrow am -Kdur 40 mEq x 2 doses  Hypophosphatemia: Phosphorus 3.2 this morning.  -Repeat phosphorus tomorrow am  Hypomagnesemia: Magnesium 2.0 this morning.  -Repeat magnesium tomorrow am  -Replaced  DVT/PE ppx: Lovenox SQ QD  Dispo: Anticipated discharge today.  The patient does not have a current PCP (No primary care provider on file.) and does need an [facility] hospital follow-up appointment after discharge.  The patient does have transportation limitations that hinder transportation to clinic appointments.    LOS: 6 days   Osa Craver, DO PGY-1 Internal Medicine Resident Pager # [phone] 08/06/2015 8:43 AM

## 2015-08-06 NOTE — Progress Notes (Signed)
Occupational Therapy Treatment Patient Details Name: Allison Chapman MRN: [id number] DOB: 09-17-1946 Today's Date: 08/06/2015    History of present illness Pt admitted following a fall and brief episode of altered mental status. CT abdomen/pelvis showed   bilateral external iliac nodal masses worrisome for metastatic malignancy. Patient also with prolapsed uterus (outside of body).   OT comments  Pt currently still mod assist +2 for sit to stand and standing during toileting tasks and functional transfers.  Limited endurance with pt initially able to tolerate standing for 2 mins to clean up bowel incontinence, however subsequent standing intervals were less than one minute after this.  Pt's daughter present and reports that the pt's family can provide 24 hour assist at discharge.  Will continue to follow for acute care OT needs.   Follow Up Recommendations  Home health OT;Supervision/Assistance - 24 hour    Equipment Recommendations  3 in 1 bedside comode;Tub/shower bench    Recommendations for Other Services      Precautions / Restrictions Precautions Precautions: Fall Precaution Comments: incontinence  Restrictions Weight Bearing Restrictions: No       Mobility Bed Mobility Overal bed mobility: Needs Assistance Bed Mobility: Rolling;Sidelying to Sit Rolling: Min assist Sidelying to sit: Mod assist       General bed mobility comments: Use of bed rail, mod to max instructional cueing to sequence.  Transfers Overall transfer level: Needs assistance Equipment used: Rolling walker (2 wheeled) Transfers: Sit to/from Stand Sit to Stand: Mod assist;+2 physical assistance Stand pivot transfers: Mod assist;+2 physical assistance            Balance Overall balance assessment: Needs assistance Sitting-balance support: Bilateral upper extremity supported Sitting balance-Leahy Scale: Fair       Standing balance-Leahy Scale: Poor Standing balance comment: Needs physical  assist to maintain standing.                    ADL Overall ADL's : Needs assistance/impaired             Lower Body Bathing: Maximal assistance;Sit to/from stand       Lower Body Dressing: Maximal assistance;Sit to/from stand   Toilet Transfer: +2 for physical assistance;Ambulation;RW;Moderate assistance   Toileting- Clothing Manipulation and Hygiene: +2 for physical assistance;Moderate assistance;Sit to/from stand       Functional mobility during ADLs: Total assistance;Moderate assistance;Rolling walker General ADL Comments: Pt transferred supine to sit EOB with max demonstrational cueing and mod assist.  She was able to sit EOB with close supervision initially but then proped on the LUE at times when resting.  Incontinence of bowel  noted with standing requring +2 assist for standing and cleanup.  Pt needed max assist to donn new brief as well.  Pt only able to tolerate standing for short intervals of 2 mins first attempt and approximately 1 minute or less the next 2 times.  Pt only ambulated aproximately 3 feet before sitting in bedside chair.  Increased cervical and trunk flexion in standing and with mobility. .                Cognition   Behavior During Therapy: Flat affect Overall Cognitive Status: Impaired/Different from baseline Area of Impairment: Awareness;Problem solving;Memory   Current Attention Level: Sustained Memory: Decreased short-term memory  Following Commands: Follows one step commands with increased time     Problem Solving: Slow processing;Requires verbal cues      Extremity/Trunk Assessment  Exercises             Pertinent Vitals/ Pain       Pain Assessment: No/denies pain         Frequency Min 2X/week     Progress Toward Goals  OT Goals(current goals can now be found in the care plan section)  Progress towards OT goals: Progressing toward goals     Plan Discharge plan remains appropriate     Co-evaluation    PT/OT/SLP Co-Evaluation/Treatment: Yes Reason for Co-Treatment: For patient/therapist safety   OT goals addressed during session: ADL's and self-care      End of Session Equipment Utilized During Treatment: Rolling walker   Activity Tolerance Patient limited by fatigue   Patient Left in chair;with call bell/phone within reach;with family/visitor present   Nurse Communication Mobility status;Other (comment) (pressure sore)        Time: 1347-1430 OT Time Calculation (min): 43 min  Charges: OT General Charges $OT Visit: 1 Procedure OT Evaluation $Initial OT Evaluation Tier I: 1 Procedure OT Treatments $Self Care/Home Management : 23-37 mins  Honour Schwieger OTR/L 08/06/2015, 2:46 PM

## 2015-08-06 NOTE — Progress Notes (Signed)
  Date: 08/06/2015  Patient name: Allison Chapman  Medical record number: [id number]  Date of birth: 1945-11-02   This patient's plan of care was discussed with the house staff. Please see Dr. Nena Alexander note for complete details. I concur with her findings.  For Proteus bacteremia, if she is tolerating PO, can likely switch to PO bactrim yesterday.  Slightly better sensitivity on local antibiogram.  Pathology returned after Dr. Nena Alexander note, consistent with metastatic squamous cell carcinoma.  I informed the patient and her daughter.  They were stoic in their response, no questions at the time.  Will further discuss with oncology in the morning.    Sid Falcon, MD 08/06/2015, 4:15 PM

## 2015-08-07 ENCOUNTER — Other Ambulatory Visit: Payer: Self-pay | Admitting: *Deleted

## 2015-08-07 DIAGNOSIS — C801 Malignant (primary) neoplasm, unspecified: Secondary | ICD-10-CM

## 2015-08-07 LAB — BASIC METABOLIC PANEL
Anion gap: 12 (ref 5–15)
BUN: 16 mg/dL (ref 6–20)
CHLORIDE: 103 mmol/L (ref 101–111)
CO2: 22 mmol/L (ref 22–32)
CREATININE: 0.96 mg/dL (ref 0.44–1.00)
Calcium: 7.8 mg/dL — ABNORMAL LOW (ref 8.9–10.3)
GFR calc non Af Amer: 58 mL/min — ABNORMAL LOW (ref >60)
GLUCOSE: 100 mg/dL — AB (ref 65–99)
Potassium: 3.9 mmol/L (ref 3.5–5.1)
Sodium: 137 mmol/L (ref 135–145)

## 2015-08-07 LAB — CBC
HEMATOCRIT: 29 % — AB (ref 36.0–46.0)
HEMOGLOBIN: 9.6 g/dL — AB (ref 12.0–15.0)
MCH: 29.6 pg (ref 26.0–34.0)
MCHC: 33.1 g/dL (ref 30.0–36.0)
MCV: 89.5 fL (ref 78.0–100.0)
Platelets: 400 10*3/uL (ref 150–400)
RBC: 3.24 MIL/uL — ABNORMAL LOW (ref 3.87–5.11)
RDW: 16 % — AB (ref 11.5–15.5)
WBC: 9.9 10*3/uL (ref 4.0–10.5)

## 2015-08-07 LAB — MAGNESIUM: Magnesium: 1.9 mg/dL (ref 1.7–2.4)

## 2015-08-07 LAB — PHOSPHORUS: PHOSPHORUS: 1.6 mg/dL — AB (ref 2.5–4.6)

## 2015-08-07 LAB — PROCALCITONIN: PROCALCITONIN: 0.82 ng/mL

## 2015-08-07 MED ORDER — K PHOS MONO-SOD PHOS DI & MONO 155-852-130 MG PO TABS
500.0000 mg | ORAL_TABLET | Freq: Once | ORAL | Status: DC
  Filled 2015-08-07: qty 2

## 2015-08-07 MED ORDER — SULFAMETHOXAZOLE-TRIMETHOPRIM 800-160 MG PO TABS
1.0000 | ORAL_TABLET | Freq: Two times a day (BID) | ORAL | Status: DC
  Administered 2015-08-07: 1 via ORAL
  Filled 2015-08-07: qty 1

## 2015-08-07 MED ORDER — SULFAMETHOXAZOLE-TRIMETHOPRIM 800-160 MG PO TABS: 1.0000 | ORAL_TABLET | Freq: Two times a day (BID) | ORAL | Status: DC

## 2015-08-07 MED ORDER — K PHOS MONO-SOD PHOS DI & MONO 155-852-130 MG PO TABS
1000.0000 mg | ORAL_TABLET | Freq: Once | ORAL | Status: DC
  Filled 2015-08-07: qty 4

## 2015-08-07 MED ORDER — PRAVASTATIN SODIUM 20 MG PO TABS: 20.0000 mg | ORAL_TABLET | Freq: Every day | ORAL | Status: DC

## 2015-08-07 NOTE — Discharge Instructions (Signed)
-  Start taking bactrim twice a day starting tonight for 10 days until you finish all the pills for the bacteria in your blood. We sent this to your pharmacy.  -Don't take your blood pressure medications until you follow-up with our clinic  -Start taking the cholesterol medication pravastatin every day -The oncology office will call you for follow-up appointment -Please follow-up with short stay for the medication to lower your calcium    -Very nice meeting you and pleasure taking care of you!

## 2015-08-07 NOTE — Discharge Summary (Signed)
Name: Allison Chapman MRN: [id number] DOB: 05-15-1946 69 y.o. PCP: No primary care provider on file.  Date of Admission: 07/31/2015  1:45 PM Date of Discharge: 08/08/2015 Attending Physician: No att. providers found  Discharge Diagnosis: 1. Metastatic Squamous Cell Carcinoma, Unknown Primary  2. Hypercalcemia of Malignancy   3. Proteus Bacteremia    Principal Problem:   Hypercalcemia Active Problems:   Essential hypertension   Hypokalemia   Vitamin D deficiency   Postmenopausal vaginal bleeding   Uterine prolapse   Lymphadenopathy   Gram negative sepsis (HCC)   Malignancy (HCC)   Vaginal bleeding  Discharge Medications:   Medication List    STOP taking these medications        amLODipine 5 MG tablet  Commonly known as:  NORVASC     chlorthalidone 25 MG tablet  Commonly known as:  HYGROTON     citalopram 20 MG tablet  Commonly known as:  CELEXA      TAKE these medications        pravastatin 20 MG tablet  Commonly known as:  PRAVACHOL  Take 1 tablet (20 mg total) by mouth daily.     sulfamethoxazole-trimethoprim 800-160 MG tablet  Commonly known as:  BACTRIM DS,SEPTRA DS  Take 1 tablet by mouth every 12 (twelve) hours.        Disposition and follow-up:   Ms.Allison Chapman was discharged from [facility] in Stable condition.  At the hospital follow up visit please address:  Primary Contact: [name] (daughter): Cell [phone], Work [phone] Secondary Contact: [name] (daughter): Cell [phone] Secondary Contact: [name] (son): Cell [phone]  1.  Metastatic Squamous Cell Carcinoma, Unknown Primary:  Biopsy of right iliac lymph node revealed squamous cell carcinoma. Likely ano-genital in origin. Consulted oncologist Dr. Ammie Dalton at [facility]. Will contact patient to schedule an appointment at [facility], likely next Friday (08/15/15). Please assess if this patient has  established a follow up appointment.  2. Hypercalcemia of Malignancy: Calcium 14 on admission. PTH, PTHrp, TSH , VIT D 1,25 within normal limits. Initially treated with calcitonin. D/C in favor of IV Pamidronate. Patient schedule at [facility] for IV Pamidronate as outpatient on 10/31. She should receive continued treatment every 2-3 weeks thereafter. Please monitor serum calcium.    3. Proteus Mirabilis Bacteremia: Patient initially treated with 1 dose Ceftriaxone followed by two days of treatment with Zosyn. Transitioned to PO Bactrim on 10/13. Continue for 10 days to complete full 14 day antibiotic course. Blood cultures were drawn 10/12 and are pending. Please follow-up.   4.  Pulmonary Nodules: Incidental pulmonary nodules found on chest CT scan. Read as non-specific. Suggest repeat CT scan of chest within 12 months.   5. Essential HTN: Patient initially hypertensive. Managed with amlodipine and carvedilol. Held beginning 10/11 2/2 to soft BP due to bacteremia. BP normalized to 133/75 at discharge. Restart amlodipine after evaluation during outpatient clinic visit on 10/20.   6.  Labs / imaging needed at time of follow-up: Repeat chest CT within 12 months. Repeat BMET for potassium and calcium. Recheck mag, phos   7.  Pending labs/ test needing follow-up: Blood cultures drawn 10/12  Follow-up Appointments: Follow-up Information    Follow up with [name], MD. Go on 08/14/2015.   Specialty:  Internal Medicine   Why:  For follow-up   Contact information:   [place]  [id number] [phone]  Follow up with [facility]. Go on 08/25/2015.   Why:  For IV Bisphophonate Treatment to manage your high calcium    Contact information:   [address] [id number] [place] [address] [phone]     ONCOLOGY TO CALL FOR FOLLOW UP APPOINTMENT FOR PATIENT.   Discharge Instructions:   Consultations:    Procedures Performed:  Dg  Chest 2 View  07/31/2015  CLINICAL DATA:  [place] this morning, hypertension, weakness in legs for 2 months EXAM: CHEST  2 VIEW COMPARISON:  None FINDINGS: Normal heart size, mediastinal contours and pulmonary vascularity. Calcification and mild tortuosity of thoracic aorta. Mild bibasilar atelectasis. Lungs otherwise clear. No pleural effusion or pneumothorax. Bones demineralized. IMPRESSION: Mild bibasilar atelectasis. Electronically Signed   By: Lavonia Dana M.D.   On: 07/31/2015 16:00   Ct Head Wo Contrast  07/31/2015  CLINICAL DATA:  69YOF right knee gave out on her today and she lowered herself to the floor. Per EMS pts family stated that pt has been slow to respond today and that she has a Possible right side facial droop. EMS noted foul smelling urine EXAM: CT HEAD WITHOUT CONTRAST TECHNIQUE: Contiguous axial images were obtained from the base of the skull through the vertex without intravenous contrast. COMPARISON:  None. FINDINGS: Probable retention cysts or polyps in the maxillary sinuses, incompletely visualized. Atherosclerotic and physiologic intracranial calcifications. Early right basal ganglia mineralization. Diffuse parenchymal atrophy. Patchy areas of hypoattenuation in deep and periventricular white matter bilaterally. Negative for acute intracranial hemorrhage, mass lesion, acute infarction, midline shift, or mass-effect. Acute infarct may be inapparent on noncontrast CT. Ventricles and sulci symmetric. Bone windows demonstrate no focal lesion. IMPRESSION: 1. Negative for bleed or other acute intracranial process. 2. Atrophy and nonspecific white matter changes. Electronically Signed   By: Lucrezia Europe M.D.   On: 07/31/2015 16:14   Ct Chest W Contrast  08/03/2015  CLINICAL DATA:  69 year old female with clinical concern for malignancy. Vaginal bleeding. Hypercalcemia. History of uterine prolapse. EXAM: CT CHEST AND ABDOMEN WITH CONTRAST TECHNIQUE: Multidetector CT imaging of the chest and  abdomen was performed following the standard protocol during bolus administration of intravenous contrast. CONTRAST:  111mL OMNIPAQUE IOHEXOL 300 MG/ML  SOLN COMPARISON:  Pelvic CT 08/02/2015. FINDINGS: CT CHEST FINDINGS Mediastinum/Lymph Nodes: Heart size is normal. There is no significant pericardial fluid, thickening or pericardial calcification. There is atherosclerosis of the thoracic aorta, the great vessels of the mediastinum and the coronary arteries, including calcified atherosclerotic plaque in the left main, left anterior descending and left circumflex coronary arteries. No pathologically enlarged mediastinal or hilar lymph nodes. Small hiatal hernia. No axillary lymphadenopathy. Lungs/Pleura: 4 mm subpleural nodule in the anterior aspect of the right middle lobe (image 31 of series 205). A few other scattered 2-3 mm nodules are noted in the left upper lobe, but are highly nonspecific. Larger more suspicious appearing pulmonary nodules or masses are otherwise noted. There is a background of moderate centrilobular emphysema. No acute consolidative airspace disease. No pleural effusions. Bibasilar areas of subsegmental atelectasis and/or scarring are noted dependently. Musculoskeletal/Soft Tissues: There are no aggressive appearing lytic or blastic lesions noted in the visualized portions of the skeleton. CT ABDOMEN FINDINGS Hepatobiliary: Sub cm low-attenuation lesion segment 7 of the liver is too small to definitively characterize, but is statistically likely a tiny cyst. No other suspicious cystic or solid hepatic lesions are noted. Dependent high attenuation material within the gallbladder is most compatible with biliary sludge. No current  findings to suggest an acute cholecystitis at this time. Pancreas: No pancreatic mass. No pancreatic ductal dilatation. No pancreatic or peripancreatic fluid or inflammatory changes. Spleen: Unremarkable. Adrenals/Urinary Tract: Numerous well-defined low-attenuation  lesions in the kidneys bilaterally, compatible with simple cysts, largest of which measures up to 7.9 cm in the lower pole of the left kidney. Moderate to severe bilateral hydroureteronephrosis in the visualized portions of the abdomen. Bilateral adrenal glands are normal in appearance. Stomach/Bowel: Normal appearance of the stomach. No pathologic dilatation of visualized portions of small bowel or colon. A few scattered colonic diverticulae are noted, without surrounding inflammatory changes to suggest an acute diverticulitis in the visualized portions of the abdomen at this time. Vascular/Lymphatic: Extensive atherosclerosis throughout the abdominal vasculature, without evidence of aneurysm or dissection in the visualized abdomen. Numerous borderline enlarged and mildly enlarged retroperitoneal lymph nodes measuring up to 12 mm in short axis are noted in the left para-aortic nodal station. Other: No significant volume of ascites in the visualized peritoneal cavity. No pneumoperitoneum. Musculoskeletal: There are no aggressive appearing lytic or blastic lesions noted in the visualized portions of the skeleton. Compression fracture of superior endplate of L2 with approximately 30% loss of central vertebral body height. IMPRESSION: 1. While there is some lymphadenopathy noted in the retroperitoneum, this is nonspecific. This could be reactive given the patient's uterine prolapse and associated bilateral hydroureteronephrosis. However, given the patient's prominent bilateral adnexal structures, which could represent lymph nodes or ovarian tissue, underlying malignancy is suspected. Further clinical correlation is suggested. 2. No other definite evidence of extra nodal metastatic disease in the abdomen. 3. There are several small nonspecific pulmonary nodules in the lungs bilaterally. While metastatic disease is not excluded, it is not strongly favored. The largest of these nodules is a 4 mm subpleural nodule in the  anterior aspect of the right middle lobe (image 31 of series 205). Attention on any future followup studies is recommended to ensure stability. At the very least, a repeat noncontrast chest CT should be performed in 12 months. 4. Atherosclerosis, including left main and 2 vessel coronary artery disease. Assessment for potential risk factor modification, dietary therapy or pharmacologic therapy may be warranted, if clinically indicated. 5. Biliary sludge lying dependently in the gallbladder. No current findings to suggest an acute cholecystitis at this time. 6. Additional incidental findings, as above. Electronically Signed   By: Vinnie Langton M.D.   On: 08/03/2015 18:25   Ct Pelvis W Contrast  08/02/2015  CLINICAL DATA:  Vaginal bleeding.  Hypercalcemia.  Uterine prolapse. EXAM: CT PELVIS WITH CONTRAST TECHNIQUE: Multidetector CT imaging of the pelvis was performed using the standard protocol following the bolus administration of intravenous contrast. CONTRAST:  13mL OMNIPAQUE IOHEXOL 300 MG/ML  SOLN COMPARISON:  None. FINDINGS: I think there is complete prolapse of the vagina and uterus. The uterus is small, as expected at this age. Within the prolapse, I think that the posterior portion of the bladder also comes along with it. Anterior portion of the bladder retains a more normal position. The ureters are dilated to the point where they enter the prolapse on their way to the posterior portion of the bladder. There is undoubtedly extrinsic compression of the ureters at this point. Complicating this case, there are bilateral necrotic nodal masses along both external iliac chains, measuring approximately 4-5 cm in size. This is a very worrisome finding and suggests metastatic malignancy. At the upper portion of this pelvic scan, I think there is also a necrotic node measuring  15 mm in diameter to the left of the aorta on image 3. There is advanced atherosclerosis of the aorta and the iliac and femoral vessels.  There is ordinary degenerative change of the lower lumbar spine. There is an old compression deformity of L4. IMPRESSION: Complete prolapse of the uterus which also brings with it the posterior portion of the bladder. The central portion of the bladder is collapsed and the anterior portion of the bladder retains a more normal position. Both ureters are dilated due to extrinsic compression as they enter the prolapse on their way to the ureterovesical junctions in the portion of the bladder that is prolapsed. Bilateral external iliac nodal masses with necrosis, 4-5 cm in diameter. This is very worrisome for metastatic malignancy. There is an additional necrotic node in the abdominal retroperitoneum to the left of the aorta on image 3. Electronically Signed   By: Nelson Chimes M.D.   On: 08/02/2015 17:03   Ct Abdomen W Contrast  08/03/2015  CLINICAL DATA:  69 year old female with clinical concern for malignancy. Vaginal bleeding. Hypercalcemia. History of uterine prolapse. EXAM: CT CHEST AND ABDOMEN WITH CONTRAST TECHNIQUE: Multidetector CT imaging of the chest and abdomen was performed following the standard protocol during bolus administration of intravenous contrast. CONTRAST:  172mL OMNIPAQUE IOHEXOL 300 MG/ML  SOLN COMPARISON:  Pelvic CT 08/02/2015. FINDINGS: CT CHEST FINDINGS Mediastinum/Lymph Nodes: Heart size is normal. There is no significant pericardial fluid, thickening or pericardial calcification. There is atherosclerosis of the thoracic aorta, the great vessels of the mediastinum and the coronary arteries, including calcified atherosclerotic plaque in the left main, left anterior descending and left circumflex coronary arteries. No pathologically enlarged mediastinal or hilar lymph nodes. Small hiatal hernia. No axillary lymphadenopathy. Lungs/Pleura: 4 mm subpleural nodule in the anterior aspect of the right middle lobe (image 31 of series 205). A few other scattered 2-3 mm nodules are noted in the  left upper lobe, but are highly nonspecific. Larger more suspicious appearing pulmonary nodules or masses are otherwise noted. There is a background of moderate centrilobular emphysema. No acute consolidative airspace disease. No pleural effusions. Bibasilar areas of subsegmental atelectasis and/or scarring are noted dependently. Musculoskeletal/Soft Tissues: There are no aggressive appearing lytic or blastic lesions noted in the visualized portions of the skeleton. CT ABDOMEN FINDINGS Hepatobiliary: Sub cm low-attenuation lesion segment 7 of the liver is too small to definitively characterize, but is statistically likely a tiny cyst. No other suspicious cystic or solid hepatic lesions are noted. Dependent high attenuation material within the gallbladder is most compatible with biliary sludge. No current findings to suggest an acute cholecystitis at this time. Pancreas: No pancreatic mass. No pancreatic ductal dilatation. No pancreatic or peripancreatic fluid or inflammatory changes. Spleen: Unremarkable. Adrenals/Urinary Tract: Numerous well-defined low-attenuation lesions in the kidneys bilaterally, compatible with simple cysts, largest of which measures up to 7.9 cm in the lower pole of the left kidney. Moderate to severe bilateral hydroureteronephrosis in the visualized portions of the abdomen. Bilateral adrenal glands are normal in appearance. Stomach/Bowel: Normal appearance of the stomach. No pathologic dilatation of visualized portions of small bowel or colon. A few scattered colonic diverticulae are noted, without surrounding inflammatory changes to suggest an acute diverticulitis in the visualized portions of the abdomen at this time. Vascular/Lymphatic: Extensive atherosclerosis throughout the abdominal vasculature, without evidence of aneurysm or dissection in the visualized abdomen. Numerous borderline enlarged and mildly enlarged retroperitoneal lymph nodes measuring up to 12 mm in short axis are noted  in the  left para-aortic nodal station. Other: No significant volume of ascites in the visualized peritoneal cavity. No pneumoperitoneum. Musculoskeletal: There are no aggressive appearing lytic or blastic lesions noted in the visualized portions of the skeleton. Compression fracture of superior endplate of L2 with approximately 30% loss of central vertebral body height. IMPRESSION: 1. While there is some lymphadenopathy noted in the retroperitoneum, this is nonspecific. This could be reactive given the patient's uterine prolapse and associated bilateral hydroureteronephrosis. However, given the patient's prominent bilateral adnexal structures, which could represent lymph nodes or ovarian tissue, underlying malignancy is suspected. Further clinical correlation is suggested. 2. No other definite evidence of extra nodal metastatic disease in the abdomen. 3. There are several small nonspecific pulmonary nodules in the lungs bilaterally. While metastatic disease is not excluded, it is not strongly favored. The largest of these nodules is a 4 mm subpleural nodule in the anterior aspect of the right middle lobe (image 31 of series 205). Attention on any future followup studies is recommended to ensure stability. At the very least, a repeat noncontrast chest CT should be performed in 12 months. 4. Atherosclerosis, including left main and 2 vessel coronary artery disease. Assessment for potential risk factor modification, dietary therapy or pharmacologic therapy may be warranted, if clinically indicated. 5. Biliary sludge lying dependently in the gallbladder. No current findings to suggest an acute cholecystitis at this time. 6. Additional incidental findings, as above. Electronically Signed   By: Vinnie Langton M.D.   On: 08/03/2015 18:25   Ct Biopsy  08/05/2015  CLINICAL DATA:  Necrotic lymphadenopathy of the iliac lymph node chains bilaterally. EXAM: CT GUIDED CORE BIOPSY OF RIGHT ILIAC LYMPH NODE  ANESTHESIA/SEDATION: 2.0  Mg IV Versed; 50 mcg IV Fentanyl Total Moderate Sedation Time: 12 minutes. PROCEDURE: The procedure risks, benefits, and alternatives were explained to the patient. Questions regarding the procedure were encouraged and answered. The patient understands and consents to the procedure. A time-out was performed prior to the procedure. The right lower abdominal wall was prepped with Betadine in a sterile fashion, and a sterile drape was applied covering the operative field. A sterile gown and sterile gloves were used for the procedure. Local anesthesia was provided with 1% Lidocaine. CT was performed in a supine position. Under CT guidance, a 17 gauge needle was advanced from an anterior approach into the right pelvis. After confirming needle tip position, a total of 4 separate coaxial 18 gauge core biopsy samples were obtained and submitted in saline. The outer needle was removed and additional CT performed. COMPLICATIONS: None FINDINGS: Core biopsy was performed at the level of enlarged necrotic lymph nodes in the right iliac chain. Anterior approach was performed through the level of the right iliacus muscle. Solid tissue and necrotic appearing tissue was obtained. IMPRESSION: CT-guided core biopsy performed of necrotic lymph node mass in the right iliac chain. Electronically Signed   By: Aletta Edouard M.D.   On: 08/05/2015 17:08   Dg Knee Complete 4 Views Right  07/31/2015  CLINICAL DATA:  Acute right knee pain after falling this morning. Initial encounter. EXAM: RIGHT KNEE - COMPLETE 4+ VIEW COMPARISON:  None. FINDINGS: There is no evidence of fracture, dislocation, or joint effusion. There is no evidence of arthropathy or other focal bone abnormality. Soft tissues are unremarkable. IMPRESSION: Normal right knee. Electronically Signed   By: Marijo Conception, M.D.   On: 07/31/2015 16:01    Admission HPI:  Ms. Czerwinski is a 69 yo female with PMHx of HTN  and HLD who presented to the ED  after falling. She states she went to the bathroom and fell to the floor. She states she was not on the floor for very long. Patient's sister and daughter had arrived at the home and found her on the bathroom floor. She was confused and altered which lasted for about 30 minutes. Patient doesn't know what caused her to fall and denies any symptoms prior to falling. She states she just "slipped down." Her right knee felt weak at the time. She denies any chest pain, shortness of breath, lightheadedness or confusion prior to the fall. She did hit her right knee when she fell to the ground and she is currently having some pain in her right knee. Patient's sister and daughter found her on the floor in the bathroom and called EMS.   In the ED, vitals were stable. CBC wnl. LFTs wnl. CXR showed mild bibasilar atelectasis. CT head showed no signs of bleeding or other acute abnormalities. EKG showed sinus tachycardia. CMET revealed hypercalemia of 14.5 (corrected 14.9). Albumin 3.5.   She does admit to increased polydipsia for 1-2 days. She states she has had a decreased appetite and decreased food intake for the past 3-4 days which she contributes to nausea. Patient also agrees to increased fatigue for the last few weeks which her daughter who lives with her agrees with. Patient has had right knee pain for the past 3 months, but denies any weakness or any bone pain.   Patient denies fever, chills, weight loss, night sweats, chest pain, shortness of breath, palpitations, cough, hemoptysis, abdominal pain, diarrhea, constipation, blood in her stool, increased urination, dysuria, decreased urination. She denies any decreased concentration or increased confusion recently other than the episode today.   Patient denies taking any new supplements such as vitamins. Patient did recently start a new blood pressure medication recently, amlodipine. She was previously on chlorthalidone, but has not been taking it in a while.    Patient has never had any surgeries. She has never had any kidney stones, pancreatitis, or stomach ulcers. She has not had a mammogram in greater than 10 years, but denies feeling any new lumps. Her last mammogram was normal. She has not had a pap smear in a long time, but her last pap was reportedly normal. She has never had a colonoscopy. She has never had her thyroid checked.   Hospital Course by problem list: Principal Problem:   Hypercalcemia Active Problems:   Essential hypertension   Hypokalemia   Vitamin D deficiency   Postmenopausal vaginal bleeding   Uterine prolapse   Lymphadenopathy   Gram negative sepsis (HCC)   Malignancy (HCC)   Vaginal bleeding   1. Hypercalcemia of Malignancy: Patient presented with confusion following a fall and was found to be  Hypercalcemic at a corrected calcium of 14.9. CT scan was negative for acute intracranial processes. Patient's hypercalcemia was initially treated with Calcitonin which was transitioned to IV Pamidronate, of which a one time dose of 60 mg IV was given. Patient was initially aggressively rehydrated with IV NS at 150-200 cc hr to a urine output of 100-150 cc/hr as per standard of care. IVF were discontinued on day 2 due to improved serum calcium levels. Serum calcium levels continued to downtrend to 7.8 on day of discharge. Patient is scheduled for IV Pamidronate as outpatient at the [facility] on 110/31, with instruction to continue therapy every 2-3 weeks. Workup included PTH, PTHrp, Vit D 25,  Vit D 1,25 OH and TSH, all of which were within normal limits.   2.  Metastatic Squamous Cell Carcinoma, Unknown Primary: Pelvic CT scan revealed bilateral necrotic external iliac lymph nodes. Interventional radiology performed a biopsy which revealed squamous cell carcinoma. The primary tumor was not located during this hospital stay, however is likely ano-genital or cervical.  Pap-smear was unremarkable, however patient has a  markedly prolapsed uterus with extensive excoriations, which could be location of primary tumor. Visual inspection of genitals did not reveal a tumor. Patient will need anoscopy and is to be scheduled with the Multidisciplinary Oncology Clinic at [facility] by Dr. Benay Spice.  3.  Proteus Miribilis Bacteremia: Patient spiked a fever to 103.4 during admission. During this time her mental status deteriorated and her blood pressure softened to 100/60. Patient had a mid leukocytosis to 10.6. Procalcitonin was WNL. She was prophylactically placed on Ceftriaxone for gram negative coverage. This was transitioned to Zosyn for pseudomonal coverage.  Home BP medications were held to maintain adequate perfusion.  Patients fever, BP and mental status subsequently improved. Blood cultures subsequently grew Proteus miribilis. Patient was transitioned to appropriate antibiotic coverage with PO Bactrim and discharged with a RX for a 12 day supply, thus completing a 14 day regimen.   4. Acute Encephalopathy: Patient was alert and oriented x 2 (person and place, not time) during admission. This is likely 2/2 to the combination of hypercalcemia and early dementia. Patient mental status improved to baseline at discharge.   5. Prolapsed Uterus, Stage IV : Patient found to have a stage IV prolapsed uterus. On examination, is extensively excoriated. CT abdomen revealed dilated ureters likely 2/2 to obstruction caused by prolapse. Patient denies difficulty urinating. Consider hysterectomy.   6. Vaginal Bleeding: 1 year history likely secondary to extensive uterine excoriations. Pap smear normal.   7. Hypokalemia/Hypophosphatemia/hypomagnesemia: Likely 2/2 to aggressive IVF for hypercalcemia. These were repleted as necessary during admission.   9. Essential hypertension. Patient consistently hypertensive during early hospital stay. HTN was adequately managed with 10 mg amplodipine daily and carvedilol 3.125 mg BID. This was  discontinued on 10/11 seondary to a marked softening of blood pressure 2/2 to bacteremia.  Patient blood pressure normalized to 133/75 on date of discharge.    Discharge Vitals:   BP 133/75 mmHg  Pulse 93  Temp(Src) 99.3 F (37.4 C) (Oral)  Resp 16  Ht 5\' 5"  (1.651 m)  Wt 140 lb 8 oz (63.73 kg)  BMI 23.38 kg/m2  SpO2 94%  Signed: Osa Craver, DO PGY-2 Internal Medicine Resident Pager # [phone] 08/08/2015 5:15 PM   Services Ordered on Discharge: None Equipment Ordered on Discharge: None

## 2015-08-07 NOTE — Progress Notes (Signed)
  Date: 08/07/2015  Patient name: Allison Chapman  Medical record number: [id number]  Date of birth: Jun 28, 1946   This patient's plan of care was discussed with the house staff. Please see Dr. Harley Hallmark note for complete details. I concur with her findings.  Ms. Kaman has been set up with Oncology and Primary Care follow up for further work up of her carcinoma.  She will be discharged today.    Sid Falcon, MD 08/07/2015, 3:49 PM

## 2015-08-07 NOTE — Progress Notes (Addendum)
Subjective:  Pt seen and examined in PM. No acute events overnight. 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 She currently denies any symptoms and would like to go home. Per her family at bedside she has returned to her normal mental status.    Objective: Vital signs in last 24 hours: Filed Vitals:   08/06/15 1436 08/06/15 1726 08/06/15 2040 08/07/15 0340  BP:  124/74 113/68 133/75  Pulse: 110 91 91 93  Temp:  98 F (36.7 C) 98.9 F (37.2 C) 99.3 F (37.4 C)  TempSrc:  Oral Oral Oral  Resp:  18 18 16   Height:      Weight:   140 lb 8 oz (63.73 kg)   SpO2: 97% 98% 94% 94%   Weight change: 5 lb 5.7 oz (2.43 kg)  Intake/Output Summary (Last 24 hours) at 08/07/15 1430 Last data filed at 08/06/15 2200  Gross per 24 hour  Intake    290 ml  Output      0 ml  Net    290 ml    PHYSICAL EXAMINATION:  General: NAD Heart: Normal rate and rhythm  Lungs: Clear to auscultation bilaterally with no wheezing, ronchi, or rales  Abdomen: Soft, non-tender, non-distended, normal BS Extremities: No edema  Skin: No rashes  Neuro: A & O x 2 (not to time)   Lab Results: Basic Metabolic Panel:  Recent Labs Lab 08/06/15 0541 08/07/15 0640  NA 134* 137  K 3.0* 3.9  CL 99* 103  CO2 23 22  GLUCOSE 94 100*  BUN 16 16  CREATININE 1.13* 0.96  CALCIUM 7.5* 7.8*  MG 2.0 1.9  PHOS 3.2 1.6*   Liver Function Tests:  Recent Labs Lab 08/02/15 0520 08/04/15 0534  AST 35 41  ALT 19 29  ALKPHOS 60 69  BILITOT 0.6 1.2  PROT 6.9 6.6  ALBUMIN 2.6* 2.3*    CBC:  Recent Labs Lab 07/31/15 1519  08/04/15 0938  08/06/15 0541 08/07/15 0640  WBC 10.8*  < > 11.3*  < > 10.6* 9.9  NEUTROABS 9.3*  --  9.4*  --   --   --   HGB 14.7  < > 11.1*  < > 9.7* 9.6*  HCT 42.9  < > 33.1*  < > 29.1* 29.0*  MCV 89.0  < > 88.3  < > 89.0 89.5  PLT 505*  < > 459*  < > 391 400  < > = values in this interval not displayed.  CBG:  Recent Labs Lab  08/03/15 2103 08/04/15 0737  GLUCAP 100* 83   Fasting Lipid Panel:  Recent Labs Lab 08/02/15 0520  CHOL 203*  HDL 42  LDLCALC 140*  TRIG 105  CHOLHDL 4.8   Thyroid Function Tests:  Recent Labs Lab 07/31/15 1927  TSH 0.422   Coagulation:  Recent Labs Lab 08/05/15 0635  LABPROT 16.6*  INR 1.33   Urinalysis:  Recent Labs Lab 07/31/15 1842  COLORURINE AMBER*  LABSPEC 1.015  PHURINE 6.0  GLUCOSEU NEGATIVE  HGBUR LARGE*  BILIRUBINUR NEGATIVE  KETONESUR 15*  PROTEINUR 100*  UROBILINOGEN 1.0  NITRITE NEGATIVE  LEUKOCYTESUR NEGATIVE    Micro Results: Recent Results (from the past 240 hour(s))  Urine culture     Status: None   Collection Time: 07/31/15  6:42 PM  Result Value Ref Range Status   Specimen Description URINE, CATHETERIZED  Final   Special Requests NONE  Final   Culture 7,000 COLONIES/mL INSIGNIFICANT GROWTH  Final   Report Status 08/01/2015 FINAL  Final  Culture, blood (routine x 2)     Status: None (Preliminary result)   Collection Time: 08/04/15  8:14 AM  Result Value Ref Range Status   Specimen Description BLOOD LEFT ANTECUBITAL  Final   Special Requests IN PEDIATRIC BOTTLE 3CC  Final   Culture NO GROWTH 3 DAYS  Final   Report Status PENDING  Incomplete  Culture, blood (routine x 2)     Status: None   Collection Time: 08/04/15  8:24 AM  Result Value Ref Range Status   Specimen Description BLOOD RIGHT FOREARM  Final   Special Requests IN PEDIATRIC BOTTLE 3CC  Final   Culture  Setup Time   Final    GRAM NEGATIVE RODS IN PEDIATRIC BOTTLE CRITICAL RESULT CALLED TO, READ BACK BY AND VERIFIED WITH: TINA ISAACS @1141  08/04/15 MKELLY    Culture PROTEUS MIRABILIS  Final   Report Status 08/06/2015 FINAL  Final   Organism ID, Bacteria PROTEUS MIRABILIS  Final      Susceptibility   Proteus mirabilis - MIC*    AMPICILLIN <=2 SENSITIVE Sensitive     CEFAZOLIN <=4 SENSITIVE Sensitive     CEFEPIME <=1 SENSITIVE Sensitive     CEFTAZIDIME <=1  SENSITIVE Sensitive     CEFTRIAXONE <=1 SENSITIVE Sensitive     CIPROFLOXACIN <=0.25 SENSITIVE Sensitive     GENTAMICIN <=1 SENSITIVE Sensitive     IMIPENEM 2 SENSITIVE Sensitive     TRIMETH/SULFA <=20 SENSITIVE Sensitive     AMPICILLIN/SULBACTAM <=2 SENSITIVE Sensitive     PIP/TAZO <=4 SENSITIVE Sensitive     * PROTEUS MIRABILIS  Culture, blood (routine x 2)     Status: None (Preliminary result)   Collection Time: 08/07/15  6:46 AM  Result Value Ref Range Status   Specimen Description BLOOD LEFT HAND  Final   Special Requests BOTTLES DRAWN AEROBIC AND ANAEROBIC 10CC  Final   Culture PENDING  Incomplete   Report Status PENDING  Incomplete   Studies/Results: Ct Biopsy  08/05/2015  CLINICAL DATA:  Necrotic lymphadenopathy of the iliac lymph node chains bilaterally. EXAM: CT GUIDED CORE BIOPSY OF RIGHT ILIAC LYMPH NODE ANESTHESIA/SEDATION: 2.0  Mg IV Versed; 50 mcg IV Fentanyl Total Moderate Sedation Time: 12 minutes. PROCEDURE: The procedure risks, benefits, and alternatives were explained to the patient. Questions regarding the procedure were encouraged and answered. The patient understands and consents to the procedure. A time-out was performed prior to the procedure. The right lower abdominal wall was prepped with Betadine in a sterile fashion, and a sterile drape was applied covering the operative field. A sterile gown and sterile gloves were used for the procedure. Local anesthesia was provided with 1% Lidocaine. CT was performed in a supine position. Under CT guidance, a 17 gauge needle was advanced from an anterior approach into the right pelvis. After confirming needle tip position, a total of 4 separate coaxial 18 gauge core biopsy samples were obtained and submitted in saline. The outer needle was removed and additional CT performed. COMPLICATIONS: None FINDINGS: Core biopsy was performed at the level of enlarged necrotic lymph nodes in the right iliac chain. Anterior approach was  performed through the level of the right iliacus muscle. Solid tissue and necrotic appearing tissue was obtained. IMPRESSION: CT-guided core biopsy performed of necrotic lymph node mass in the right iliac chain. Electronically Signed   By: Aletta Edouard M.D.   On: 08/05/2015 17:08   Medications: I have reviewed the patient's  current medications. Scheduled Meds: . diphenoxylate-atropine  1 tablet Oral QID  . enoxaparin (LOVENOX) injection  40 mg Subcutaneous Q24H  . Influenza vac split quadrivalent PF  0.5 mL Intramuscular Tomorrow-1000  . sulfamethoxazole-trimethoprim  1 tablet Oral Q12H   Continuous Infusions:  PRN Meds:.acetaminophen **OR** acetaminophen Assessment/Plan:  Hypercalcemia in setting of Metastatic Squamous Cell Carcinoma or unknown primary site - Uncorrected calcium 7.8 s/p 5 doses of calcitonin and 1 dose of pamidronate. Most likely anogenital source (anal vs vulvar/vaginal vs cervical). Pap smear did not reveal malignant cells. PTHrP level was negative.  -Pt to be contacted by [facility] for further work-up -Pt to follow-up at short stay for pamidronate infusion on 10/31   Pansensitive Proteus Bacteremia - Most likely in setting of probable UTI due to uterine prolapse.   -Prescribe bactrim-DS 800-160 mg for 10 days to complete 14 day course  -Follow-up repeat blood cultures from 10/13  Hypophosphatemia - Phosphorus level 1.6. Etiology due to hypercalcemia which has resolved.  -Replete with 1 g PO phosphorous  -Check phosphorus level at follow-up visit  Hypertension - Currently normotensive at BP goal <150/90 (no DM or CKD) -Hold home amlodipine 5 mg daily and chlorthalidone 100 mg daily until follow-up visit   Complete Uterine Prolapse - Pt with intermittent vaginal  spotting.  -Pt encourage to undergo hysterectomy due to risk of infection -Pt needs follow-up with gynecology for options for uterine prolapse   Normocytic Anemia - Hg 9.6 with Hg 14.7 on admission.  Etiology most likely due to ACD in setting of malignancy vs acute blood loss from vaginal bleeding. .  -Check CBC and anemia panel at follow-up visit   Hyperlipidemia - Last lipid panel on 10/8 with LDL 140.  -Prescribe home pravastatin 20 mg daily on discharge.    Dispo: Disposition is deferred at this time, awaiting improvement of current medical problems.  Anticipated discharge is today.   The patient does have a current PCP (No primary care provider on file.) and does need an [facility] hospital follow-up appointment after discharge.  The patient does have transportation limitations that hinder transportation to clinic appointments.  .Services Needed at time of discharge: Y = Yes, Blank = No PT:  Home health  OT:  Home health  RN:   Equipment:   Other:  rolling walker with 5" wheels, 3 in 1 bedside commode/ shower bench    LOS: 7 days   Juluis Mire, MD 08/07/2015, 2:30 PM

## 2015-08-07 NOTE — Progress Notes (Signed)
Subjective: No acute overnight events  Patient seen this AM. States overall feels improved and wants to go home. Discussed need for follow-up with multidiciplinary clinic next Friday. Siblings expressed understanding  Objective: Vital signs in last 24 hours: Filed Vitals:   08/06/15 1436 08/06/15 1726 08/06/15 2040 08/07/15 0340  BP:  124/74 113/68 133/75  Pulse: 110 91 91 93  Temp:  98 F (36.7 C) 98.9 F (37.2 C) 99.3 F (37.4 C)  TempSrc:  Oral Oral Oral  Resp:  18 18 16   Height:      Weight:   63.73 kg (140 lb 8 oz)   SpO2: 97% 98% 94% 94%   Weight change: 2.43 kg (5 lb 5.7 oz)  Intake/Output Summary (Last 24 hours) at 08/07/15 1622 Last data filed at 08/06/15 2200  Gross per 24 hour  Intake    290 ml  Output      0 ml  Net    290 ml   Physical Exam  Constitutional:  Thin elderly female laying in bed in NAD   HENT:  Head: Normocephalic and atraumatic.  Mouth/Throat: No oropharyngeal exudate.  Eyes: EOM are normal. No scleral icterus.  Neck: Normal range of motion.  Cardiovascular: Normal rate and regular rhythm.  Exam reveals no gallop and no friction rub.   No murmur heard. Pulmonary/Chest: Effort normal and breath sounds normal. She has no wheezes.  Abdominal: Soft. Bowel sounds are normal. She exhibits no distension. There is no tenderness. There is no rebound and no guarding.  Genitourinary:  Stage IV Uterine Prolapse with extensive exoriations. No anogenital lesions   Musculoskeletal: Normal range of motion. She exhibits no edema.  Lymphadenopathy:    She has no cervical adenopathy.  Neurological: She is alert.  Skin: Skin is warm and dry.  Psychiatric:  Flat affect     Lab Results: Results for orders placed or performed during the hospital encounter of 07/31/15 (from the past 48 hour(s))  CBC     Status: Abnormal   Collection Time: 08/06/15  5:41 AM  Result Value Ref Range   WBC 10.6 (H) 4.0 - 10.5 K/uL   RBC 3.27 (L) 3.87 - 5.11 MIL/uL   Hemoglobin 9.7 (L) 12.0 - 15.0 g/dL   HCT 29.1 (L) 36.0 - 46.0 %   MCV 89.0 78.0 - 100.0 fL   MCH 29.7 26.0 - 34.0 pg   MCHC 33.3 30.0 - 36.0 g/dL   RDW 16.0 (H) 11.5 - 15.5 %   Platelets 391 150 - 400 K/uL  Basic metabolic panel     Status: Abnormal   Collection Time: 08/06/15  5:41 AM  Result Value Ref Range   Sodium 134 (L) 135 - 145 mmol/L   Potassium 3.0 (L) 3.5 - 5.1 mmol/L    Comment: DELTA CHECK NOTED   Chloride 99 (L) 101 - 111 mmol/L   CO2 23 22 - 32 mmol/L   Glucose, Bld 94 65 - 99 mg/dL   BUN 16 6 - 20 mg/dL   Creatinine, Ser 1.13 (H) 0.44 - 1.00 mg/dL   Calcium 7.5 (L) 8.9 - 10.3 mg/dL   GFR calc non Af Amer 48 (L) >60 mL/min   GFR calc Af Amer 55 (L) >60 mL/min    Comment: (NOTE) The eGFR has been calculated using the CKD EPI equation. This calculation has not been validated in all clinical situations. eGFR's persistently <60 mL/min signify possible Chronic Kidney Disease.    Anion gap 12 5 - 15  Magnesium     Status: None   Collection Time: 08/06/15  5:41 AM  Result Value Ref Range   Magnesium 2.0 1.7 - 2.4 mg/dL  Phosphorus     Status: None   Collection Time: 08/06/15  5:41 AM  Result Value Ref Range   Phosphorus 3.2 2.5 - 4.6 mg/dL  CBC     Status: Abnormal   Collection Time: 08/07/15  6:40 AM  Result Value Ref Range   WBC 9.9 4.0 - 10.5 K/uL   RBC 3.24 (L) 3.87 - 5.11 MIL/uL   Hemoglobin 9.6 (L) 12.0 - 15.0 g/dL   HCT 29.0 (L) 36.0 - 46.0 %   MCV 89.5 78.0 - 100.0 fL   MCH 29.6 26.0 - 34.0 pg   MCHC 33.1 30.0 - 36.0 g/dL   RDW 16.0 (H) 11.5 - 15.5 %   Platelets 400 150 - 400 K/uL  Basic metabolic panel     Status: Abnormal   Collection Time: 08/07/15  6:40 AM  Result Value Ref Range   Sodium 137 135 - 145 mmol/L   Potassium 3.9 3.5 - 5.1 mmol/L   Chloride 103 101 - 111 mmol/L   CO2 22 22 - 32 mmol/L   Glucose, Bld 100 (H) 65 - 99 mg/dL   BUN 16 6 - 20 mg/dL   Creatinine, Ser 0.96 0.44 - 1.00 mg/dL   Calcium 7.8 (L) 8.9 - 10.3 mg/dL   GFR  calc non Af Amer 58 (L) >60 mL/min   GFR calc Af Amer >60 >60 mL/min    Comment: (NOTE) The eGFR has been calculated using the CKD EPI equation. This calculation has not been validated in all clinical situations. eGFR's persistently <60 mL/min signify possible Chronic Kidney Disease.    Anion gap 12 5 - 15  Magnesium     Status: None   Collection Time: 08/07/15  6:40 AM  Result Value Ref Range   Magnesium 1.9 1.7 - 2.4 mg/dL  Phosphorus     Status: Abnormal   Collection Time: 08/07/15  6:40 AM  Result Value Ref Range   Phosphorus 1.6 (L) 2.5 - 4.6 mg/dL  Procalcitonin     Status: None   Collection Time: 08/07/15  6:40 AM  Result Value Ref Range   Procalcitonin 0.82 ng/mL    Comment:        Interpretation: PCT > 0.5 ng/mL and <= 2 ng/mL: Systemic infection (sepsis) is possible, but other conditions are known to elevate PCT as well. (NOTE)         ICU PCT Algorithm               Non ICU PCT Algorithm    ----------------------------     ------------------------------         PCT < 0.25 ng/mL                 PCT < 0.1 ng/mL     Stopping of antibiotics            Stopping of antibiotics       strongly encouraged.               strongly encouraged.    ----------------------------     ------------------------------       PCT level decrease by               PCT < 0.25 ng/mL       >= 80% from peak PCT       OR  PCT 0.25 - 0.5 ng/mL          Stopping of antibiotics                                             encouraged.     Stopping of antibiotics           encouraged.    ----------------------------     ------------------------------       PCT level decrease by              PCT >= 0.25 ng/mL       < 80% from peak PCT        AND PCT >= 0.5 ng/mL             Continuing antibiotics                                              encouraged.       Continuing antibiotics            encouraged.    ----------------------------     ------------------------------     PCT level increase  compared          PCT > 0.5 ng/mL         with peak PCT AND          PCT >= 0.5 ng/mL             Escalation of antibiotics                                          strongly encouraged.      Escalation of antibiotics        strongly encouraged.   Culture, blood (routine x 2)     Status: None (Preliminary result)   Collection Time: 08/07/15  6:46 AM  Result Value Ref Range   Specimen Description BLOOD LEFT HAND    Special Requests BOTTLES DRAWN AEROBIC AND ANAEROBIC 10CC    Culture PENDING    Report Status PENDING    Micro Results: Recent Results (from the past 240 hour(s))  Urine culture     Status: None   Collection Time: 07/31/15  6:42 PM  Result Value Ref Range Status   Specimen Description URINE, CATHETERIZED  Final   Special Requests NONE  Final   Culture 7,000 COLONIES/mL INSIGNIFICANT GROWTH  Final   Report Status 08/01/2015 FINAL  Final  Culture, blood (routine x 2)     Status: None (Preliminary result)   Collection Time: 08/04/15  8:14 AM  Result Value Ref Range Status   Specimen Description BLOOD LEFT ANTECUBITAL  Final   Special Requests IN PEDIATRIC BOTTLE 3CC  Final   Culture NO GROWTH 3 DAYS  Final   Report Status PENDING  Incomplete  Culture, blood (routine x 2)     Status: None   Collection Time: 08/04/15  8:24 AM  Result Value Ref Range Status   Specimen Description BLOOD RIGHT FOREARM  Final   Special Requests IN PEDIATRIC BOTTLE 3CC  Final   Culture  Setup Time   Final    GRAM NEGATIVE RODS IN PEDIATRIC BOTTLE  CRITICAL RESULT CALLED TO, READ BACK BY AND VERIFIED WITH: TINA ISAACS @1141  08/04/15 MKELLY    Culture PROTEUS MIRABILIS  Final   Report Status 08/06/2015 FINAL  Final   Organism ID, Bacteria PROTEUS MIRABILIS  Final      Susceptibility   Proteus mirabilis - MIC*    AMPICILLIN <=2 SENSITIVE Sensitive     CEFAZOLIN <=4 SENSITIVE Sensitive     CEFEPIME <=1 SENSITIVE Sensitive     CEFTAZIDIME <=1 SENSITIVE Sensitive     CEFTRIAXONE <=1  SENSITIVE Sensitive     CIPROFLOXACIN <=0.25 SENSITIVE Sensitive     GENTAMICIN <=1 SENSITIVE Sensitive     IMIPENEM 2 SENSITIVE Sensitive     TRIMETH/SULFA <=20 SENSITIVE Sensitive     AMPICILLIN/SULBACTAM <=2 SENSITIVE Sensitive     PIP/TAZO <=4 SENSITIVE Sensitive     * PROTEUS MIRABILIS  Culture, blood (routine x 2)     Status: None (Preliminary result)   Collection Time: 08/07/15  6:46 AM  Result Value Ref Range Status   Specimen Description BLOOD LEFT HAND  Final   Special Requests BOTTLES DRAWN AEROBIC AND ANAEROBIC 10CC  Final   Culture PENDING  Incomplete   Report Status PENDING  Incomplete   Studies/Results: Ct Biopsy  08/05/2015  CLINICAL DATA:  Necrotic lymphadenopathy of the iliac lymph node chains bilaterally. EXAM: CT GUIDED CORE BIOPSY OF RIGHT ILIAC LYMPH NODE ANESTHESIA/SEDATION: 2.0  Mg IV Versed; 50 mcg IV Fentanyl Total Moderate Sedation Time: 12 minutes. PROCEDURE: The procedure risks, benefits, and alternatives were explained to the patient. Questions regarding the procedure were encouraged and answered. The patient understands and consents to the procedure. A time-out was performed prior to the procedure. The right lower abdominal wall was prepped with Betadine in a sterile fashion, and a sterile drape was applied covering the operative field. A sterile gown and sterile gloves were used for the procedure. Local anesthesia was provided with 1% Lidocaine. CT was performed in a supine position. Under CT guidance, a 17 gauge needle was advanced from an anterior approach into the right pelvis. After confirming needle tip position, a total of 4 separate coaxial 18 gauge core biopsy samples were obtained and submitted in saline. The outer needle was removed and additional CT performed. COMPLICATIONS: None FINDINGS: Core biopsy was performed at the level of enlarged necrotic lymph nodes in the right iliac chain. Anterior approach was performed through the level of the right iliacus  muscle. Solid tissue and necrotic appearing tissue was obtained. IMPRESSION: CT-guided core biopsy performed of necrotic lymph node mass in the right iliac chain. Electronically Signed   By: Aletta Edouard M.D.   On: 08/05/2015 17:08   Medications: I have reviewed the patient's current medications. Scheduled Meds: . diphenoxylate-atropine  1 tablet Oral QID  . enoxaparin (LOVENOX) injection  40 mg Subcutaneous Q24H  . Influenza vac split quadrivalent PF  0.5 mL Intramuscular Tomorrow-1000  . phosphorus  1,000 mg Oral Once  . sulfamethoxazole-trimethoprim  1 tablet Oral Q12H   Continuous Infusions:  PRN Meds:.acetaminophen **OR** acetaminophen Assessment/Plan: Principal Problem:   Hypercalcemia Active Problems:   Essential hypertension   Hypokalemia   Vitamin D deficiency   Postmenopausal vaginal bleeding   Uterine prolapse   Lymphadenopathy   Gram negative sepsis (HCC)   Malignancy (HCC)   Vaginal bleeding  ##Severe, symptomatic hypercalcemia 2/2 to malignancy: Ca: 14.5 on admission.Patient calcium normalized. 7.8 today. PTHrp negative. -Bisphosphonate IV Therapy q 3 weeks. Appointment set up for 10/31 with Short Stay  Procedure Clinic    ##Metastatic Squamous Cell Carcinoma, Unknown Primary: CT scan of pelvis revealed bilateral necrotic external iliac lymph nodes. Biopsy of right external ilac node revealed metastatic squamous cell Carcinoma. Likely ano-rectal in origin -following up with Multidisciplinary Oncology Clinic with Dr. Benay Spice.  ##Pulmonary Nodules: CT scan revealed non-specific pulmonary nodules -Re-scan in next 12 months.   ##Alerted Mental Status: Mental status has deteriorated over last 24 hours. In light of positive bacterial cultures, low BP and fever, must consider sepsis. Patient given Ceftriaxone, subsequently broadened to Pip-tazo for psuedomonal coverage. Narrowed to Bactrim once culture grew proteus.  -Bactrim outpatient therapy, 1 tablet BID x 10  days  -f/u blood culture 10/12  ##Fever: Patient spiked fever of 103.4 overnight. Blood Ccx subsequently grew proteus. resolved with acetaminophen and antibiotics. Patient BP, fever and MS resolved. WBC 9.9 today.  -Blood cultures drawn 10/12 and pending  ##Diarrhea: 2 day history of loose watery stools, particulary with straining to sit up. Likely secondary to IVF and nutrtion. Seems to have resolved.  -Immodium PRN   ##Hypertension: Hypotensive last night, likely 2/2 to bacteremia. Hold anti-hypertensives. Continue following OP f/u on 10/20. -Amlodipine 10 mg daily HELD  -Carvedilol 6.25 BID HELD -Continue to monitor   #Hypomagnesemia: Likely 2/2 to overhydration due to aggressive IVF. Mg 1.9 this morning. Resolved -Daily monitoring   ##Hypophosphatemia: Likely 2/2 to overhydration due to aggressive IVF 1.6 this morning -Replete with K phos 750 TID -Continue monitoring   ##Hypokalemia: Likely 2/2 to overhydration due to aggressive IVF. K 3.9 (N) this morning. Resolved  -Kdur 40 mEq PRN -Daily BMET  ##DVT PPx:  -Lovenox 40 SQ daily  ##Disposition: Likely Discharge to home today This is a Careers information officer Note.  The care of the patient was discussed with Dr. Daryll Drown and the assessment and plan formulated with their assistance.  Please see their attached note for official documentation of the daily encounter.   LOS: 7 days   Florentina Addison, Med Student 08/07/2015, 4:22 PM

## 2015-08-07 NOTE — Progress Notes (Signed)
Referral from hospital IM to Dr. Benay Spice (on call). After review of case and speaking with Dr. Marko Plume, will schedule for GYN Oncology [facility] physician) and to see Medonc/Radonc in clinic on 08/15/15. Referral message to HIM and radiation oncology department.

## 2015-08-08 ENCOUNTER — Telehealth: Payer: Self-pay | Admitting: Hematology

## 2015-08-08 ENCOUNTER — Telehealth: Payer: Self-pay | Admitting: *Deleted

## 2015-08-08 ENCOUNTER — Telehealth: Payer: Self-pay

## 2015-08-08 NOTE — Telephone Encounter (Signed)
  Oncology Nurse Navigator Documentation    Navigator Encounter Type: Telephone (08/08/15 1803) : VM from daughter requesting to reschedule her 10/21 visit. Forwarded message to Rhys Martini, new patient scheduler to contact daughter and reschedule for another day with Dr. Burr Medico or Dr. Benay Spice.

## 2015-08-08 NOTE — Telephone Encounter (Signed)
new patient appt-s/w patient dtr and gave np appt for 08/15/15 @ 8:15 w/dr. Burr Medico

## 2015-08-08 NOTE — Telephone Encounter (Signed)
HOPPER - A [facility] nurse called saying the patient is not wanting at home care today or over the weekend.  She would like one early next week.

## 2015-08-08 NOTE — Telephone Encounter (Signed)
FYI

## 2015-08-09 LAB — CULTURE, BLOOD (ROUTINE X 2): CULTURE: NO GROWTH

## 2015-08-11 ENCOUNTER — Telehealth: Payer: Self-pay | Admitting: Hematology

## 2015-08-11 ENCOUNTER — Telehealth: Payer: Self-pay

## 2015-08-11 NOTE — Telephone Encounter (Signed)
[facility] called to let pts provider know that she has refused in home care the last two visits and nurse will try again tomorrow   Best phone for nurse Morey Hummingbird is [phone]

## 2015-08-11 NOTE — Telephone Encounter (Signed)
left message for patient dtr [name] [phone] to return call.

## 2015-08-11 NOTE — Telephone Encounter (Signed)
NEW PATIENT APPT-S/W PATIENT DTR TONYA AND GAVE NP APPT FOR 10/27 @ 2;45 WDR/ FENG.

## 2015-08-11 NOTE — Telephone Encounter (Signed)
Patient request noted.

## 2015-08-11 NOTE — Telephone Encounter (Signed)
Noted.  I guess that is the patient"s prerogative

## 2015-08-12 LAB — CULTURE, BLOOD (ROUTINE X 2)
CULTURE: NO GROWTH
Culture: NO GROWTH

## 2015-08-12 NOTE — Telephone Encounter (Signed)
Call the patient:  If they don't want the physical therapy services please just discontinue it.

## 2015-08-12 NOTE — Telephone Encounter (Signed)
Allison Chapman from [facility] called today to inform us that pt is also refusing physical therapy and occupational therapy.

## 2015-08-13 ENCOUNTER — Ambulatory Visit: Payer: Medicare PPO | Admitting: Radiation Oncology

## 2015-08-13 NOTE — Telephone Encounter (Signed)
Left message on [place] VM.

## 2015-08-14 ENCOUNTER — Encounter: Payer: Self-pay | Admitting: Radiation Oncology

## 2015-08-14 ENCOUNTER — Ambulatory Visit: Payer: Medicare PPO | Admitting: Internal Medicine

## 2015-08-15 ENCOUNTER — Ambulatory Visit: Payer: Medicare PPO | Admitting: Gynecologic Oncology

## 2015-08-15 ENCOUNTER — Ambulatory Visit: Payer: Medicare PPO | Admitting: Hematology

## 2015-08-17 ENCOUNTER — Encounter (HOSPITAL_COMMUNITY): Payer: Self-pay | Admitting: Emergency Medicine

## 2015-08-17 ENCOUNTER — Inpatient Hospital Stay (HOSPITAL_COMMUNITY)
Admission: EM | Admit: 2015-08-17 | Discharge: 2015-08-21 | DRG: 640 | Disposition: A | Discharge status: Home / Self Care | Payer: Medicare PPO | Attending: Internal Medicine | Admitting: Internal Medicine

## 2015-08-17 ENCOUNTER — Emergency Department (HOSPITAL_COMMUNITY): Payer: Medicare PPO

## 2015-08-17 DIAGNOSIS — E43 Unspecified severe protein-calorie malnutrition: Secondary | ICD-10-CM | POA: Diagnosis present

## 2015-08-17 DIAGNOSIS — E162 Hypoglycemia, unspecified: Secondary | ICD-10-CM | POA: Diagnosis not present

## 2015-08-17 DIAGNOSIS — I639 Cerebral infarction, unspecified: Secondary | ICD-10-CM

## 2015-08-17 DIAGNOSIS — N179 Acute kidney failure, unspecified: Secondary | ICD-10-CM | POA: Diagnosis present

## 2015-08-17 DIAGNOSIS — D630 Anemia in neoplastic disease: Secondary | ICD-10-CM | POA: Diagnosis present

## 2015-08-17 DIAGNOSIS — N1330 Unspecified hydronephrosis: Secondary | ICD-10-CM | POA: Diagnosis present

## 2015-08-17 DIAGNOSIS — B964 Proteus (mirabilis) (morganii) as the cause of diseases classified elsewhere: Secondary | ICD-10-CM | POA: Diagnosis present

## 2015-08-17 DIAGNOSIS — I679 Cerebrovascular disease, unspecified: Secondary | ICD-10-CM | POA: Diagnosis present

## 2015-08-17 DIAGNOSIS — I700 Atherosclerosis of aorta: Secondary | ICD-10-CM | POA: Diagnosis present

## 2015-08-17 DIAGNOSIS — Z87891 Personal history of nicotine dependence: Secondary | ICD-10-CM

## 2015-08-17 DIAGNOSIS — M25561 Pain in right knee: Secondary | ICD-10-CM | POA: Diagnosis present

## 2015-08-17 DIAGNOSIS — IMO0002 Reserved for concepts with insufficient information to code with codable children: Secondary | ICD-10-CM | POA: Insufficient documentation

## 2015-08-17 DIAGNOSIS — C801 Malignant (primary) neoplasm, unspecified: Secondary | ICD-10-CM | POA: Diagnosis present

## 2015-08-17 DIAGNOSIS — I2510 Atherosclerotic heart disease of native coronary artery without angina pectoris: Secondary | ICD-10-CM | POA: Diagnosis present

## 2015-08-17 DIAGNOSIS — C775 Secondary and unspecified malignant neoplasm of intrapelvic lymph nodes: Secondary | ICD-10-CM | POA: Diagnosis present

## 2015-08-17 DIAGNOSIS — N814 Uterovaginal prolapse, unspecified: Secondary | ICD-10-CM | POA: Diagnosis present

## 2015-08-17 DIAGNOSIS — C799 Secondary malignant neoplasm of unspecified site: Secondary | ICD-10-CM | POA: Insufficient documentation

## 2015-08-17 DIAGNOSIS — I10 Essential (primary) hypertension: Secondary | ICD-10-CM | POA: Diagnosis present

## 2015-08-17 DIAGNOSIS — R410 Disorientation, unspecified: Secondary | ICD-10-CM | POA: Diagnosis present

## 2015-08-17 DIAGNOSIS — R29810 Facial weakness: Secondary | ICD-10-CM | POA: Diagnosis present

## 2015-08-17 DIAGNOSIS — E46 Unspecified protein-calorie malnutrition: Secondary | ICD-10-CM | POA: Diagnosis present

## 2015-08-17 DIAGNOSIS — E872 Acidosis: Secondary | ICD-10-CM | POA: Diagnosis present

## 2015-08-17 DIAGNOSIS — R7881 Bacteremia: Secondary | ICD-10-CM | POA: Diagnosis present

## 2015-08-17 DIAGNOSIS — Z6823 Body mass index (BMI) 23.0-23.9, adult: Secondary | ICD-10-CM

## 2015-08-17 DIAGNOSIS — D72829 Elevated white blood cell count, unspecified: Secondary | ICD-10-CM | POA: Diagnosis present

## 2015-08-17 LAB — I-STAT CHEM 8, ED
BUN: 45 mg/dL — ABNORMAL HIGH (ref 6–20)
CALCIUM ION: 2.31 mmol/L — AB (ref 1.13–1.30)
Chloride: 113 mmol/L — ABNORMAL HIGH (ref 101–111)
Creatinine, Ser: 2 mg/dL — ABNORMAL HIGH (ref 0.44–1.00)
GLUCOSE: 89 mg/dL (ref 65–99)
HCT: 43 % (ref 36.0–46.0)
HEMOGLOBIN: 14.6 g/dL (ref 12.0–15.0)
Potassium: 5.7 mmol/L — ABNORMAL HIGH (ref 3.5–5.1)
SODIUM: 139 mmol/L (ref 135–145)
TCO2: 23 mmol/L (ref 0–100)

## 2015-08-17 LAB — COMPREHENSIVE METABOLIC PANEL
ALK PHOS: 74 U/L (ref 38–126)
ALT: 17 U/L (ref 14–54)
ANION GAP: 15 (ref 5–15)
AST: 31 U/L (ref 15–41)
Albumin: 2.9 g/dL — ABNORMAL LOW (ref 3.5–5.0)
BUN: 31 mg/dL — ABNORMAL HIGH (ref 6–20)
CHLORIDE: 104 mmol/L (ref 101–111)
CO2: 19 mmol/L — AB (ref 22–32)
Creatinine, Ser: 1.88 mg/dL — ABNORMAL HIGH (ref 0.44–1.00)
GFR calc non Af Amer: 26 mL/min — ABNORMAL LOW (ref >60)
GFR, EST AFRICAN AMERICAN: 30 mL/min — AB (ref >60)
Glucose, Bld: 87 mg/dL (ref 65–99)
POTASSIUM: 5.1 mmol/L (ref 3.5–5.1)
SODIUM: 138 mmol/L (ref 135–145)
Total Bilirubin: 0.8 mg/dL (ref 0.3–1.2)
Total Protein: 8.6 g/dL — ABNORMAL HIGH (ref 6.5–8.1)

## 2015-08-17 LAB — CBC
HCT: 36.2 % (ref 36.0–46.0)
Hemoglobin: 11.7 g/dL — ABNORMAL LOW (ref 12.0–15.0)
MCH: 29.1 pg (ref 26.0–34.0)
MCHC: 32.3 g/dL (ref 30.0–36.0)
MCV: 90 fL (ref 78.0–100.0)
PLATELETS: 493 10*3/uL — AB (ref 150–400)
RBC: 4.02 MIL/uL (ref 3.87–5.11)
RDW: 16.3 % — ABNORMAL HIGH (ref 11.5–15.5)
WBC: 18.8 10*3/uL — ABNORMAL HIGH (ref 4.0–10.5)

## 2015-08-17 LAB — LACTIC ACID, PLASMA: LACTIC ACID, VENOUS: 3.5 mmol/L — AB (ref 0.5–2.0)

## 2015-08-17 LAB — MAGNESIUM: Magnesium: 1.8 mg/dL (ref 1.7–2.4)

## 2015-08-17 LAB — PHOSPHORUS: PHOSPHORUS: 3.9 mg/dL (ref 2.5–4.6)

## 2015-08-17 MED ORDER — SODIUM CHLORIDE 0.9 % IV SOLN
INTRAVENOUS | Status: DC
  Administered 2015-08-17 – 2015-08-19 (×5): via INTRAVENOUS

## 2015-08-17 MED ORDER — CALCIUM GLUCONATE 10 % IV SOLN: 1.0000 g | Freq: Once | INTRAVENOUS | Status: DC

## 2015-08-17 MED ORDER — SODIUM CHLORIDE 0.9 % IJ SOLN
3.0000 mL | Freq: Two times a day (BID) | INTRAMUSCULAR | Status: DC
  Administered 2015-08-17 – 2015-08-20 (×4): 3 mL via INTRAVENOUS

## 2015-08-17 MED ORDER — SODIUM CHLORIDE 0.9 % IV SOLN: INTRAVENOUS | Status: DC

## 2015-08-17 MED ORDER — SODIUM CHLORIDE 0.9 % IV BOLUS (SEPSIS)
1000.0000 mL | Freq: Once | INTRAVENOUS | Status: AC
  Administered 2015-08-17: 1000 mL via INTRAVENOUS

## 2015-08-17 MED ORDER — HEPARIN SODIUM (PORCINE) 5000 UNIT/ML IJ SOLN
5000.0000 [IU] | Freq: Three times a day (TID) | INTRAMUSCULAR | Status: DC
  Administered 2015-08-17 – 2015-08-21 (×11): 5000 [IU] via SUBCUTANEOUS
  Filled 2015-08-17 (×10): qty 1

## 2015-08-17 MED ORDER — SODIUM CHLORIDE 0.9 % IV BOLUS (SEPSIS)
500.0000 mL | Freq: Once | INTRAVENOUS | Status: AC
  Administered 2015-08-17: 500 mL via INTRAVENOUS

## 2015-08-17 MED ORDER — CALCITONIN (SALMON) 200 UNIT/ML IJ SOLN
4.0000 [IU]/kg | Freq: Once | INTRAMUSCULAR | Status: AC
  Administered 2015-08-17: 254 [IU] via INTRAMUSCULAR
  Filled 2015-08-17: qty 1.27

## 2015-08-17 MED ORDER — SODIUM CHLORIDE 0.9 % IV SOLN
60.0000 mg | Freq: Once | INTRAVENOUS | Status: AC
  Administered 2015-08-18: 60 mg via INTRAVENOUS
  Filled 2015-08-17: qty 20

## 2015-08-17 NOTE — H&P (Signed)
Date: 08/17/2015               Patient Name:  Allison Chapman MRN: [id number]  DOB: 11/07/1945 Age / Sex: 69 y.o., female   PCP: Allison Rude, MD         Medical Service: Internal Medicine Teaching Service         Attending Physician: Dr. Sid Falcon, MD    First Contact: Dr. [name], MD Pager: [phone]  Second Contact: Dr. 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 She was joined by her daughters relayed the history. The evening of 10/22, they noticed Allison Chapman seemed a little confused, tired. The morning of 10/23, her daughter Allison Chapman was awakened by Allison Chapman carrying on a conversation with a family member who was not there. She also appeared more drowsy. Over time, her language became incomprehensible. They also noticed a right-sided facial droop, which has since improved, with no obvious one-sided weakness, seizure activity, or blackouts. In the ED, she was noted to be hypercalcemic to >15, was started on IV fluids, and was treated with calcitonin. Her family denies that she's had any fever, chills, chest pain, shortness of breath,  headache, abdominal pain, dysuria, back pain, polydipsia, bowel or bladder incontinence, nausea, vomiting, diarrhea, falls since her last admission, or skin changes. She was previously able to perform all her ADLs at home. She is not currently on thiazide diuretics. 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 She has never had a colonoscopy. Her mother also recently passed  from [place].   She was previously admitted on 10/6 for a fall in the setting of altered mental status with hypercalcemia to 14.5. PTH, PTHrp, TSH , VIT D 1,25 within normal limits. She was treated with calcitonin and IV bisphosphonates. She was scheduled for a pamidronate infusion on 10/31.   On the 10/6 admission, Pelvic CT scan revealed bilateral necrotic external iliac lymph nodes. Interventional radiology performed a biopsy which revealed squamous cell carcinoma. The primary tumor was not located during this hospital stay, however is likely ano-genital or cervical. Pap-smear was unremarkable, however patient has a markedly prolapsed uterus with extensive excoriations, which could be location of primary tumor. Visual inspection of genitals did not reveal a tumor. Patient will need anoscopy, and was scheduled to follow-up with rad-onc on 10/24 and oncology on 10/27.  Blood cultures on the 10/14 admission, also demonstrated Proteus mirabilis bacteremia without a known source. She had been treated with 1 dose ceftriaxone, 2 days of zosyn, and transitioned to Bactrim PO for a 14 day course. However, she had cut the course short due to difficulty swallowing the pills. Blood cultures from 10/13 showed no growth, and UA was unremarkable.   Meds: Current Facility-Administered Medications  Medication Dose Route Frequency Provider Last Rate Last Dose  . 0.9 %  sodium chloride infusion   Intravenous Continuous Fredia Sorrow, MD      . heparin injection 5,000 Units  5,000 Units Subcutaneous 3 times per day Riccardo Dubin, MD      .  sodium chloride 0.9 % injection 3 mL  3 mL Intravenous Q12H Rushil Sherrye Payor, MD        Allergies: Allergies as of 08/17/2015  . (No Known Allergies)   Past Medical History  Diagnosis Date  . Hypertension   . Encephalopathy acute   . Prolapsed uterus     stage IV  . Vaginal bleeding   . 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 Tubal ligation     Family History  Problem Relation Age of Onset  . Hypertension Mother   . Cancer Mother   . Lymphoma Mother   . Hyperthyroidism Daughter    Social History   Social History  . Marital Status: Divorced    Spouse Name: N/A  . Number of Children: N/A  . Years of Education: N/A   Occupational History  . Not on file.   Social History Main Topics  . Smoking status: Never Smoker   . Smokeless tobacco: Not on file  . Alcohol Use: Not on file  . Drug Use: Not on file  . Sexual Activity: Not on file   Other Topics Concern  . Not on file   Social History Narrative    Review of Systems: Negative except per HPI  Physical Exam: Blood pressure 144/82, pulse 55, temperature 98.1 F (36.7 C), resp. rate 19, height 5\' 1"  (1.549 m), SpO2 97 %. General: Somnolent-appearing woman lying in bed HEENT: Dry mucous membranes, but unwilling to open mouth fully. Reduced pupil diameter but responsive. No supraclavicular or cervical lymphadenopathy Cardiovascular: Regular rate and rhythm, no murmurs, rubs, or gallops Pulmonary: Clear to ausculation in anterior lung fields. Unable to follow commands to take full respirations Abdominal: Soft, not obviously tender, non-distended Neurological: Arousable but minimally responsive and interactive. Hyperreflexia in upper extremities Skin: Warm and dry, no rashes. Extremities: No clubbing cyanosis or edema.   Lab results: Basic Metabolic Panel:  Recent Labs  08/17/15 1838 08/17/15 1851 08/17/15 2128  NA 138 139  --   K 5.1 5.7*  --   CL 104 113*  --   CO2 19*  --   --   GLUCOSE 87 89  --   BUN 31* 45*  --   CREATININE 1.88* 2.00*  --   CALCIUM >15.0*  --   --   MG  --   --  1.8  PHOS  --   --  3.9   Liver Function Tests:  Recent Labs  08/17/15 1838  AST 31  ALT 17  ALKPHOS 74  BILITOT 0.8  PROT 8.6*  ALBUMIN 2.9*   CBC:  Recent Labs   08/17/15 1838 08/17/15 1851  WBC 18.8*  --   HGB 11.7* 14.6  HCT 36.2 43.0  MCV 90.0  --   PLT 493*  --      Imaging results:  Dg Chest 2 View  08/17/2015  CLINICAL DATA:  Progressive altered mental status. EXAM: CHEST  2 VIEW COMPARISON:  07/31/2015 FINDINGS: Heart size and pulmonary vascularity are normal. Minimal atelectasis at the lung bases due to a shallow inspiration. The lungs are otherwise clear. No osseous abnormality. IMPRESSION: No active cardiopulmonary disease. Electronically Signed   By: Lorriane Shire M.D.   On: 08/17/2015 19:54   Ct Head Wo Contrast  08/17/2015  CLINICAL DATA:  Right-sided facial droop and right-sided body weakness. Confusion. Symptoms present for 2 days  and worsening. EXAM: CT HEAD WITHOUT CONTRAST TECHNIQUE: Contiguous axial images were obtained from the base of the skull through the vertex without intravenous contrast. COMPARISON:  07/31/2015 FINDINGS: There is no evidence of acute cortical infarct, intracranial hemorrhage, mass, midline shift, or extra-axial fluid collection. Ventricles and sulci are within normal limits for age. Small, chronic infarcts are again seen in the deep gray nuclei and deep cerebral white matter bilaterally. Confluent hypodensities in the subcortical and deep cerebral white matter bilaterally are unchanged. The visualized portions of the orbits are unremarkable. No significant inflammatory change in the visualized paranasal sinuses. The no destructive skull lesions. Calcified atherosclerosis at the skullbase. IMPRESSION: 1. No evidence of acute intracranial abnormality. 2. 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 3. Unchanged cerebral white matter disease, nonspecific but may reflect a advanced chronic small vessel change. Electronically Signed   By: Logan Bores M.D.   On: 08/17/2015 20:04    EKG: Sinus tachycardia Nonspecific repol abnormality,  diffuse leads  Assessment & Plan by  Problem:  Altered Mental Status in Setting of Hypercalcemia: The most obvious cause of her AMS is her hypercalcemia, but concomitant processes should not be discounted. The presence of facial droop and previously identified chronic infarcts on CT may suggest CVA or TIA. Consider MRI once she's more stable. She may also have a recurrent bacteremia, especially since she was unable to finish her full course of Bactrim - 1x dose of Calcitonin - Pamidronate 60 mg IV - 2L NS boluses IV - Repeat BMET at 2am, magnesium, phosphate as well - UA, Urine culture, Blood cultures  AKI: Baseline creatinine <1.  -Fluid resuscitation per above  Metastatic Squamous Cell Carcinoma, Unknown Primary: Patient was set up for follow-up with the multi-disciplinary oncology clinic. - Will need close follow-up and rescheduling of appointments, but if unable, may be able to perform some workup as an inpatient (e.g., brain MRI) - Call oncology clinic  DVT prophylaxis: Heparin [place]   Code Status: Full  Dispo: Disposition is deferred at this time, awaiting improvement of current medical problems. Anticipated discharge in approximately 2-3 day(s).   The patient does have a current PCP (Carly Montey Hora, MD) and does need an [facility] hospital follow-up appointment after discharge.  The patient does have transportation limitations that hinder transportation to clinic appointments.  Signed: Liberty Handy, MD 08/17/2015, 11:12 PM

## 2015-08-17 NOTE — ED Notes (Signed)
MD at bedside. 

## 2015-08-17 NOTE — ED Notes (Signed)
Per family patient is verbal. Patient family states she was speaking this morning but could not understand now she is has gotten progress worse. Patient is non-verbal on arrival

## 2015-08-17 NOTE — ED Notes (Signed)
MD made aware Calcium > 15

## 2015-08-17 NOTE — Progress Notes (Signed)
CRITICAL VALUE ALERT  Critical value received: Lactic Acid 3.5  Date of notification:  08/17/15  Time of notification:  2313  Critical value read back: YES  Nurse who received alert:  Sherryl Manges, RN, BSN  MD notified (1st page):  IMTS  Time of first page:  2325  MD notified (2nd page):  Time of second page:  Responding MD:    Time MD responded:

## 2015-08-17 NOTE — ED Provider Notes (Signed)
CSN: [id number]     Arrival date & time 08/17/15  1812 History   First MD Initiated Contact with Patient 08/17/15 1834     Chief Complaint  Patient presents with  . Altered Mental Status     (Consider location/radiation/quality/duration/timing/severity/associated sxs/prior Treatment) HPI Comments: Ms. Chauca is a 69 year old female with recently diagnosed squamous cell carcinoma by right inguinal biopsy of unknown origin presents to the ED today with acute mental status change. Daughter at bedside reports that she was her normal self last night but this morning she was saying strange things that did not make any sense. She progressively got worse throughout the day until the point where she would no longer speak or respond to questions. Upon arrival to the ED she did have right sided facial droop that the daughter had not noticed previously.   The history is provided by a relative.    Past Medical History  Diagnosis Date  . Hypertension   . Encephalopathy acute   . Prolapsed uterus     stage IV  . Vaginal bleeding   . Hx of lymph node biopsy 08/05/2015    right iliac=Metastatic squamous cell carcinoma   Past Surgical History  Procedure Laterality Date  . Tubal ligation     Family History  Problem Relation Age of Onset  . Hypertension Mother   . Cancer Mother   . Lymphoma Mother   . Hyperthyroidism Daughter    Social History  Substance Use Topics  . Smoking status: Never Smoker   . Smokeless tobacco: None  . Alcohol Use: None   OB History    No data available     Review of Systems  Unable to perform ROS: Mental status change   Allergies  Review of patient's allergies indicates no known allergies.  Home Medications   Prior to Admission medications   Medication Sig Start Date End Date Taking? Authorizing Provider  pravastatin (PRAVACHOL) 20 MG tablet Take 1 tablet (20 mg total) by mouth daily. 08/07/15  Yes Juluis Mire, MD  sulfamethoxazole-trimethoprim  (BACTRIM DS,SEPTRA DS) 800-160 MG tablet Take 1 tablet by mouth every 12 (twelve) hours. 08/07/15  Yes Marjan Rabbani, MD   BP 144/82 mmHg  Pulse 55  Temp(Src) 98.1 F (36.7 C)  Resp 19  Ht 5\' 1"  (1.549 m)  SpO2 97% Physical Exam  Constitutional: She appears listless. She appears cachectic. She appears ill.  HENT:  Head: Normocephalic and atraumatic.  Eyes: Conjunctivae and EOM are normal. Pupils are equal, round, and reactive to light.  Cardiovascular: S1 normal and S2 normal.  Tachycardia present.   No murmur heard. Pulmonary/Chest: Effort normal and breath sounds normal.  Abdominal: Soft. Bowel sounds are normal. There is no tenderness.  Neurological: She appears listless.  Right sided facial droop. Unable to follow commands.   Skin: Skin is warm and dry. No erythema.    ED Course  Procedures (including critical care time) Labs Review Labs Reviewed  CBC - Abnormal; Notable for the following:    WBC 18.8 (*)    Hemoglobin 11.7 (*)    RDW 16.3 (*)    Platelets 493 (*)    All other components within normal limits  COMPREHENSIVE METABOLIC PANEL - Abnormal; Notable for the following:    CO2 19 (*)    BUN 31 (*)    Creatinine, Ser 1.88 (*)    Calcium >15.0 (*)    Total Protein 8.6 (*)    Albumin 2.9 (*)  GFR calc non Af Amer 26 (*)    GFR calc Af Amer 30 (*)    All other components within normal limits  I-STAT CHEM 8, ED - Abnormal; Notable for the following:    Potassium 5.7 (*)    Chloride 113 (*)    BUN 45 (*)    Creatinine, Ser 2.00 (*)    Calcium, Ion 2.31 (*)    All other components within normal limits  CULTURE, BLOOD (ROUTINE X 2)  CULTURE, BLOOD (ROUTINE X 2)  MRSA PCR SCREENING  MAGNESIUM  PHOSPHORUS  URINALYSIS, ROUTINE W REFLEX MICROSCOPIC (NOT AT [facility])  LACTIC ACID, PLASMA  CBC  COMPREHENSIVE METABOLIC PANEL  LACTIC ACID, PLASMA  CBG MONITORING, ED    Imaging Review Dg Chest 2 View  08/17/2015  CLINICAL DATA:  Progressive altered mental  status. EXAM: CHEST  2 VIEW COMPARISON:  07/31/2015 FINDINGS: Heart size and pulmonary vascularity are normal. Minimal atelectasis at the lung bases due to a shallow inspiration. The lungs are otherwise clear. No osseous abnormality. IMPRESSION: No active cardiopulmonary disease. Electronically Signed   By: Lorriane Shire M.D.   On: 08/17/2015 19:54   Ct Head Wo Contrast  08/17/2015  CLINICAL DATA:  Right-sided facial droop and right-sided body weakness. Confusion. Symptoms present for 2 days and worsening. EXAM: CT HEAD WITHOUT CONTRAST TECHNIQUE: Contiguous axial images were obtained from the base of the skull through the vertex without intravenous contrast. COMPARISON:  07/31/2015 FINDINGS: There is no evidence of acute cortical infarct, intracranial hemorrhage, mass, midline shift, or extra-axial fluid collection. Ventricles and sulci are within normal limits for age. Small, chronic infarcts are again seen in the deep gray nuclei and deep cerebral white matter bilaterally. Confluent hypodensities in the subcortical and deep cerebral white matter bilaterally are unchanged. The visualized portions of the orbits are unremarkable. No significant inflammatory change in the visualized paranasal sinuses. The no destructive skull lesions. Calcified atherosclerosis at the skullbase. IMPRESSION: 1. No evidence of acute intracranial abnormality. 2. 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 3. Unchanged cerebral white matter disease, nonspecific but may reflect a advanced chronic small vessel change. Electronically Signed   By: Logan Bores M.D.   On: 08/17/2015 20:04   I have personally reviewed and evaluated these images and lab results as part of my medical decision-making.   EKG Interpretation   Date/Time:  Sunday August 17 2015 18:18:35 EDT Ventricular Rate:  123 PR Interval:  134 QRS Duration: 86 QT Interval:  305 QTC Calculation: 436 R Axis:   62 Text Interpretation:   Sinus tachycardia Nonspecific repol abnormality,  diffuse leads Confirmed by ZACKOWSKI  MD, SCOTT ([id number]) on 08/17/2015  6:22:58 PM      MDM   Final diagnoses:  Malignancy ([place])  Hypercalcemia   Patient with metastatic squamous cell carcinoma diagnosed by biopsy of right iliac lymph node during previous hospitalization on 10/03-1013. Suspected from ano-genital origin but no primary located. Calcium was 14.9 on previous admission and presented with confusion and falling. Ca and mental status improved with IVF and calcitonin. She was started on Pamidronate and Ca was 7.8 on day of discharge. Workup included PTH, PTHrp, Vit D 25, Vit D 1,25 [place] and TSH, all of which were within normal limits.   Today she presents with acute mental status change that began this morning and progressively got worse throughout the day. She inially was confused and saying things that did not make any sense and later on  became non-verbal and non-responsive to questioning. She did have some right sided facial droop and drooling on arrival to the ED.   CT head with no acute abnormalities. Ca was >15. White count elevated at 18.8 and tachycardic. Afebrile. Treated with IVF and calcitonin. Blood cultures and lactate drawn.   Will admit to IMTS for further workup.     Maryellen Pile, MD 08/17/15 [id number]

## 2015-08-17 NOTE — ED Provider Notes (Signed)
I saw and evaluated the patient, reviewed the resident's note and I agree with the findings and plan.   EKG Interpretation   Date/Time:  Sunday August 17 2015 18:18:35 EDT Ventricular Rate:  123 PR Interval:  134 QRS Duration: 86 QT Interval:  305 QTC Calculation: 436 R Axis:   62 Text Interpretation:  Sinus tachycardia Nonspecific repol abnormality,  diffuse leads Confirmed by Tonya Carlile  MD, Navi [place] [phone]) on 08/17/2015  6:22:58 PM      Results for orders placed or performed during the hospital encounter of 08/17/15  CBC  Result Value Ref Range   WBC 18.8 (H) 4.0 - 10.5 K/uL   RBC 4.02 3.87 - 5.11 MIL/uL   Hemoglobin 11.7 (L) 12.0 - 15.0 g/dL   HCT 36.2 36.0 - 46.0 %   MCV 90.0 78.0 - 100.0 fL   MCH 29.1 26.0 - 34.0 pg   MCHC 32.3 30.0 - 36.0 g/dL   RDW 16.3 (H) 11.5 - 15.5 %   Platelets 493 (H) 150 - 400 K/uL  Comprehensive metabolic panel  Result Value Ref Range   Sodium 138 135 - 145 mmol/L   Potassium 5.1 3.5 - 5.1 mmol/L   Chloride 104 101 - 111 mmol/L   CO2 19 (L) 22 - 32 mmol/L   Glucose, Bld 87 65 - 99 mg/dL   BUN 31 (H) 6 - 20 mg/dL   Creatinine, Ser 1.88 (H) 0.44 - 1.00 mg/dL   Calcium >15.0 (HH) 8.9 - 10.3 mg/dL   Total Protein 8.6 (H) 6.5 - 8.1 g/dL   Albumin 2.9 (L) 3.5 - 5.0 g/dL   AST 31 15 - 41 U/L   ALT 17 14 - 54 U/L   Alkaline Phosphatase 74 38 - 126 U/L   Total Bilirubin 0.8 0.3 - 1.2 mg/dL   GFR calc non Af Amer 26 (L) >60 mL/min   GFR calc Af Amer 30 (L) >60 mL/min   Anion gap 15 5 - 15  I-stat Chem 8, ED  Result Value Ref Range   Sodium 139 135 - 145 mmol/L   Potassium 5.7 (H) 3.5 - 5.1 mmol/L   Chloride 113 (H) 101 - 111 mmol/L   BUN 45 (H) 6 - 20 mg/dL   Creatinine, Ser 2.00 (H) 0.44 - 1.00 mg/dL   Glucose, Bld 89 65 - 99 mg/dL   Calcium, Ion 2.31 (HH) 1.13 - 1.30 mmol/L   TCO2 23 0 - 100 mmol/L   Hemoglobin 14.6 12.0 - 15.0 g/dL   HCT 43.0 36.0 - 46.0 %   Comment NOTIFIED PHYSICIAN    Dg Chest 2 View  08/17/2015  CLINICAL  DATA:  Progressive altered mental status. EXAM: CHEST  2 VIEW COMPARISON:  07/31/2015 FINDINGS: Heart size and pulmonary vascularity are normal. Minimal atelectasis at the lung bases due to a shallow inspiration. The lungs are otherwise clear. No osseous abnormality. IMPRESSION: No active cardiopulmonary disease. Electronically Signed   By: Lorriane Shire M.D.   On: 08/17/2015 19:54   Dg Chest 2 View  07/31/2015  CLINICAL DATA:  [place] this morning, hypertension, weakness in legs for 2 months EXAM: CHEST  2 VIEW COMPARISON:  None FINDINGS: Normal heart size, mediastinal contours and pulmonary vascularity. Calcification and mild tortuosity of thoracic aorta. Mild bibasilar atelectasis. Lungs otherwise clear. No pleural effusion or pneumothorax. Bones demineralized. IMPRESSION: Mild bibasilar atelectasis. Electronically Signed   By: Lavonia Dana M.D.   On: 07/31/2015 16:00   Ct Head Wo Contrast  07/31/2015  CLINICAL DATA:  69YOF right knee gave out on her today and she lowered herself to the floor. Per EMS pts family stated that pt has been slow to respond today and that she has a Possible right side facial droop. EMS noted foul smelling urine EXAM: CT HEAD WITHOUT CONTRAST TECHNIQUE: Contiguous axial images were obtained from the base of the skull through the vertex without intravenous contrast. COMPARISON:  None. FINDINGS: Probable retention cysts or polyps in the maxillary sinuses, incompletely visualized. Atherosclerotic and physiologic intracranial calcifications. Early right basal ganglia mineralization. Diffuse parenchymal atrophy. Patchy areas of hypoattenuation in deep and periventricular white matter bilaterally. Negative for acute intracranial hemorrhage, mass lesion, acute infarction, midline shift, or mass-effect. Acute infarct may be inapparent on noncontrast CT. Ventricles and sulci symmetric. Bone windows demonstrate no focal lesion. IMPRESSION: 1. Negative for bleed or other acute intracranial  process. 2. Atrophy and nonspecific white matter changes. Electronically Signed   By: Lucrezia Europe M.D.   On: 07/31/2015 16:14   Ct Chest W Contrast  08/03/2015  CLINICAL DATA:  69 year old female with clinical concern for malignancy. Vaginal bleeding. Hypercalcemia. History of uterine prolapse. EXAM: CT CHEST AND ABDOMEN WITH CONTRAST TECHNIQUE: Multidetector CT imaging of the chest and abdomen was performed following the standard protocol during bolus administration of intravenous contrast. CONTRAST:  111mL OMNIPAQUE IOHEXOL 300 MG/ML  SOLN COMPARISON:  Pelvic CT 08/02/2015. FINDINGS: CT CHEST FINDINGS Mediastinum/Lymph Nodes: Heart size is normal. There is no significant pericardial fluid, thickening or pericardial calcification. There is atherosclerosis of the thoracic aorta, the great vessels of the mediastinum and the coronary arteries, including calcified atherosclerotic plaque in the left main, left anterior descending and left circumflex coronary arteries. No pathologically enlarged mediastinal or hilar lymph nodes. Small hiatal hernia. No axillary lymphadenopathy. Lungs/Pleura: 4 mm subpleural nodule in the anterior aspect of the right middle lobe (image 31 of series 205). A few other scattered 2-3 mm nodules are noted in the left upper lobe, but are highly nonspecific. Larger more suspicious appearing pulmonary nodules or masses are otherwise noted. There is a background of moderate centrilobular emphysema. No acute consolidative airspace disease. No pleural effusions. Bibasilar areas of subsegmental atelectasis and/or scarring are noted dependently. Musculoskeletal/Soft Tissues: There are no aggressive appearing lytic or blastic lesions noted in the visualized portions of the skeleton. CT ABDOMEN FINDINGS Hepatobiliary: Sub cm low-attenuation lesion segment 7 of the liver is too small to definitively characterize, but is statistically likely a tiny cyst. No other suspicious cystic or solid hepatic  lesions are noted. Dependent high attenuation material within the gallbladder is most compatible with biliary sludge. No current findings to suggest an acute cholecystitis at this time. Pancreas: No pancreatic mass. No pancreatic ductal dilatation. No pancreatic or peripancreatic fluid or inflammatory changes. Spleen: Unremarkable. Adrenals/Urinary Tract: Numerous well-defined low-attenuation lesions in the kidneys bilaterally, compatible with simple cysts, largest of which measures up to 7.9 cm in the lower pole of the left kidney. Moderate to severe bilateral hydroureteronephrosis in the visualized portions of the abdomen. Bilateral adrenal glands are normal in appearance. Stomach/Bowel: Normal appearance of the stomach. No pathologic dilatation of visualized portions of small bowel or colon. A few scattered colonic diverticulae are noted, without surrounding inflammatory changes to suggest an acute diverticulitis in the visualized portions of the abdomen at this time. Vascular/Lymphatic: Extensive atherosclerosis throughout the abdominal vasculature, without evidence of aneurysm or dissection in the visualized abdomen. Numerous borderline enlarged and mildly enlarged retroperitoneal lymph nodes measuring up to 12  mm in short axis are noted in the left para-aortic nodal station. Other: No significant volume of ascites in the visualized peritoneal cavity. No pneumoperitoneum. Musculoskeletal: There are no aggressive appearing lytic or blastic lesions noted in the visualized portions of the skeleton. Compression fracture of superior endplate of L2 with approximately 30% loss of central vertebral body height. IMPRESSION: 1. While there is some lymphadenopathy noted in the retroperitoneum, this is nonspecific. This could be reactive given the patient's uterine prolapse and associated bilateral hydroureteronephrosis. However, given the patient's prominent bilateral adnexal structures, which could represent lymph nodes  or ovarian tissue, underlying malignancy is suspected. Further clinical correlation is suggested. 2. No other definite evidence of extra nodal metastatic disease in the abdomen. 3. There are several small nonspecific pulmonary nodules in the lungs bilaterally. While metastatic disease is not excluded, it is not strongly favored. The largest of these nodules is a 4 mm subpleural nodule in the anterior aspect of the right middle lobe (image 31 of series 205). Attention on any future followup studies is recommended to ensure stability. At the very least, a repeat noncontrast chest CT should be performed in 12 months. 4. Atherosclerosis, including left main and 2 vessel coronary artery disease. Assessment for potential risk factor modification, dietary therapy or pharmacologic therapy may be warranted, if clinically indicated. 5. Biliary sludge lying dependently in the gallbladder. No current findings to suggest an acute cholecystitis at this time. 6. Additional incidental findings, as above. Electronically Signed   By: Vinnie Langton M.D.   On: 08/03/2015 18:25   Ct Pelvis W Contrast  08/02/2015  CLINICAL DATA:  Vaginal bleeding.  Hypercalcemia.  Uterine prolapse. EXAM: CT PELVIS WITH CONTRAST TECHNIQUE: Multidetector CT imaging of the pelvis was performed using the standard protocol following the bolus administration of intravenous contrast. CONTRAST:  144mL OMNIPAQUE IOHEXOL 300 MG/ML  SOLN COMPARISON:  None. FINDINGS: I think there is complete prolapse of the vagina and uterus. The uterus is small, as expected at this age. Within the prolapse, I think that the posterior portion of the bladder also comes along with it. Anterior portion of the bladder retains a more normal position. The ureters are dilated to the point where they enter the prolapse on their way to the posterior portion of the bladder. There is undoubtedly extrinsic compression of the ureters at this point. Complicating this case, there are  bilateral necrotic nodal masses along both external iliac chains, measuring approximately 4-5 cm in size. This is a very worrisome finding and suggests metastatic malignancy. At the upper portion of this pelvic scan, I think there is also a necrotic node measuring 15 mm in diameter to the left of the aorta on image 3. There is advanced atherosclerosis of the aorta and the iliac and femoral vessels. There is ordinary degenerative change of the lower lumbar spine. There is an old compression deformity of L4. IMPRESSION: Complete prolapse of the uterus which also brings with it the posterior portion of the bladder. The central portion of the bladder is collapsed and the anterior portion of the bladder retains a more normal position. Both ureters are dilated due to extrinsic compression as they enter the prolapse on their way to the ureterovesical junctions in the portion of the bladder that is prolapsed. Bilateral external iliac nodal masses with necrosis, 4-5 cm in diameter. This is very worrisome for metastatic malignancy. There is an additional necrotic node in the abdominal retroperitoneum to the left of the aorta on image 3. Electronically Signed  By: Nelson Chimes M.D.   On: 08/02/2015 17:03   Ct Abdomen W Contrast  08/03/2015  CLINICAL DATA:  69 year old female with clinical concern for malignancy. Vaginal bleeding. Hypercalcemia. History of uterine prolapse. EXAM: CT CHEST AND ABDOMEN WITH CONTRAST TECHNIQUE: Multidetector CT imaging of the chest and abdomen was performed following the standard protocol during bolus administration of intravenous contrast. CONTRAST:  165mL OMNIPAQUE IOHEXOL 300 MG/ML  SOLN COMPARISON:  Pelvic CT 08/02/2015. FINDINGS: CT CHEST FINDINGS Mediastinum/Lymph Nodes: Heart size is normal. There is no significant pericardial fluid, thickening or pericardial calcification. There is atherosclerosis of the thoracic aorta, the great vessels of the mediastinum and the coronary arteries,  including calcified atherosclerotic plaque in the left main, left anterior descending and left circumflex coronary arteries. No pathologically enlarged mediastinal or hilar lymph nodes. Small hiatal hernia. No axillary lymphadenopathy. Lungs/Pleura: 4 mm subpleural nodule in the anterior aspect of the right middle lobe (image 31 of series 205). A few other scattered 2-3 mm nodules are noted in the left upper lobe, but are highly nonspecific. Larger more suspicious appearing pulmonary nodules or masses are otherwise noted. There is a background of moderate centrilobular emphysema. No acute consolidative airspace disease. No pleural effusions. Bibasilar areas of subsegmental atelectasis and/or scarring are noted dependently. Musculoskeletal/Soft Tissues: There are no aggressive appearing lytic or blastic lesions noted in the visualized portions of the skeleton. CT ABDOMEN FINDINGS Hepatobiliary: Sub cm low-attenuation lesion segment 7 of the liver is too small to definitively characterize, but is statistically likely a tiny cyst. No other suspicious cystic or solid hepatic lesions are noted. Dependent high attenuation material within the gallbladder is most compatible with biliary sludge. No current findings to suggest an acute cholecystitis at this time. Pancreas: No pancreatic mass. No pancreatic ductal dilatation. No pancreatic or peripancreatic fluid or inflammatory changes. Spleen: Unremarkable. Adrenals/Urinary Tract: Numerous well-defined low-attenuation lesions in the kidneys bilaterally, compatible with simple cysts, largest of which measures up to 7.9 cm in the lower pole of the left kidney. Moderate to severe bilateral hydroureteronephrosis in the visualized portions of the abdomen. Bilateral adrenal glands are normal in appearance. Stomach/Bowel: Normal appearance of the stomach. No pathologic dilatation of visualized portions of small bowel or colon. A few scattered colonic diverticulae are noted, without  surrounding inflammatory changes to suggest an acute diverticulitis in the visualized portions of the abdomen at this time. Vascular/Lymphatic: Extensive atherosclerosis throughout the abdominal vasculature, without evidence of aneurysm or dissection in the visualized abdomen. Numerous borderline enlarged and mildly enlarged retroperitoneal lymph nodes measuring up to 12 mm in short axis are noted in the left para-aortic nodal station. Other: No significant volume of ascites in the visualized peritoneal cavity. No pneumoperitoneum. Musculoskeletal: There are no aggressive appearing lytic or blastic lesions noted in the visualized portions of the skeleton. Compression fracture of superior endplate of L2 with approximately 30% loss of central vertebral body height. IMPRESSION: 1. While there is some lymphadenopathy noted in the retroperitoneum, this is nonspecific. This could be reactive given the patient's uterine prolapse and associated bilateral hydroureteronephrosis. However, given the patient's prominent bilateral adnexal structures, which could represent lymph nodes or ovarian tissue, underlying malignancy is suspected. Further clinical correlation is suggested. 2. No other definite evidence of extra nodal metastatic disease in the abdomen. 3. There are several small nonspecific pulmonary nodules in the lungs bilaterally. While metastatic disease is not excluded, it is not strongly favored. The largest of these nodules is a 4 mm subpleural nodule in the  anterior aspect of the right middle lobe (image 31 of series 205). Attention on any future followup studies is recommended to ensure stability. At the very least, a repeat noncontrast chest CT should be performed in 12 months. 4. Atherosclerosis, including left main and 2 vessel coronary artery disease. Assessment for potential risk factor modification, dietary therapy or pharmacologic therapy may be warranted, if clinically indicated. 5. Biliary sludge lying  dependently in the gallbladder. No current findings to suggest an acute cholecystitis at this time. 6. Additional incidental findings, as above. Electronically Signed   By: Vinnie Langton M.D.   On: 08/03/2015 18:25   Ct Biopsy  08/05/2015  CLINICAL DATA:  Necrotic lymphadenopathy of the iliac lymph node chains bilaterally. EXAM: CT GUIDED CORE BIOPSY OF RIGHT ILIAC LYMPH NODE ANESTHESIA/SEDATION: 2.0  Mg IV Versed; 50 mcg IV Fentanyl Total Moderate Sedation Time: 12 minutes. PROCEDURE: The procedure risks, benefits, and alternatives were explained to the patient. Questions regarding the procedure were encouraged and answered. The patient understands and consents to the procedure. A time-out was performed prior to the procedure. The right lower abdominal wall was prepped with Betadine in a sterile fashion, and a sterile drape was applied covering the operative field. A sterile gown and sterile gloves were used for the procedure. Local anesthesia was provided with 1% Lidocaine. CT was performed in a supine position. Under CT guidance, a 17 gauge needle was advanced from an anterior approach into the right pelvis. After confirming needle tip position, a total of 4 separate coaxial 18 gauge core biopsy samples were obtained and submitted in saline. The outer needle was removed and additional CT performed. COMPLICATIONS: None FINDINGS: Core biopsy was performed at the level of enlarged necrotic lymph nodes in the right iliac chain. Anterior approach was performed through the level of the right iliacus muscle. Solid tissue and necrotic appearing tissue was obtained. IMPRESSION: CT-guided core biopsy performed of necrotic lymph node mass in the right iliac chain. Electronically Signed   By: Aletta Edouard M.D.   On: 08/05/2015 17:08   Dg Knee Complete 4 Views Right  07/31/2015  CLINICAL DATA:  Acute right knee pain after falling this morning. Initial encounter. EXAM: RIGHT KNEE - COMPLETE 4+ VIEW COMPARISON:   None. FINDINGS: There is no evidence of fracture, dislocation, or joint effusion. There is no evidence of arthropathy or other focal bone abnormality. Soft tissues are unremarkable. IMPRESSION: Normal right knee. Electronically Signed   By: Marijo Conception, M.D.   On: 07/31/2015 16:01    CRITICAL CARE Performed by: Fredia Sorrow Total critical care time: 30 Critical care time was exclusive of separately billable procedures and treating other patients. Critical care was necessary to treat or prevent imminent or life-threatening deterioration. Critical care was time spent personally by me on the following activities: development of treatment plan with patient and/or surrogate as well as nursing, discussions with consultants, evaluation of patient's response to treatment, examination of patient, obtaining history from patient or surrogate, ordering and performing treatments and interventions, ordering and review of laboratory studies, ordering and review of radiographic studies, pulse oximetry and re-evaluation of patient's condition.    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 Patient presenting with markedly elevated calcium. Patient will be treated for acute hypercalcemia. Patient with recent admission for similar problem. But today calcium is above 15. Patient retrieve IV fluids and creatinine are elevated patient also be treated with calcitonin. Patient will require admission.  Patient with the altered mental status  worse today a very well be related to the elevated calcium. Patient will have workup to make sure this is not another cause or CNS event that's the cause of the altered mental status.  Fredia Sorrow, MD 08/17/15 2003

## 2015-08-17 NOTE — ED Notes (Signed)
Patient comes from Home Per EMS patient has been confused for two days and has gotten increasingly worse. Patient is nonverbal but per EMS patient is able to follow commands. Patient pressure with EMS 80's HR in the 120's. Patient does have a Right-sided facial droop. Patient weak hand grips to the right side. Patient unable to move lower extremities.

## 2015-08-18 ENCOUNTER — Ambulatory Visit: Payer: Medicare PPO | Admitting: Radiation Oncology

## 2015-08-18 ENCOUNTER — Observation Stay (HOSPITAL_COMMUNITY): Payer: Medicare PPO

## 2015-08-18 ENCOUNTER — Ambulatory Visit: Payer: Medicare PPO

## 2015-08-18 ENCOUNTER — Other Ambulatory Visit: Payer: Self-pay | Admitting: Hematology

## 2015-08-18 ENCOUNTER — Ambulatory Visit
Admit: 2015-08-18 | Discharge: 2015-08-18 | Disposition: A | Discharge status: Home / Self Care | Payer: Medicare PPO | Attending: Radiation Oncology | Admitting: Radiation Oncology

## 2015-08-18 ENCOUNTER — Ambulatory Visit
Admission: RE | Admit: 2015-08-18 | Discharge: 2015-08-18 | Disposition: A | Discharge status: Home / Self Care | Payer: Medicare PPO | Source: Ambulatory Visit | Attending: Radiation Oncology | Admitting: Radiation Oncology

## 2015-08-18 DIAGNOSIS — I10 Essential (primary) hypertension: Secondary | ICD-10-CM | POA: Diagnosis present

## 2015-08-18 DIAGNOSIS — C778 Secondary and unspecified malignant neoplasm of lymph nodes of multiple regions: Secondary | ICD-10-CM | POA: Diagnosis not present

## 2015-08-18 DIAGNOSIS — IMO0002 Reserved for concepts with insufficient information to code with codable children: Secondary | ICD-10-CM

## 2015-08-18 DIAGNOSIS — R7881 Bacteremia: Secondary | ICD-10-CM | POA: Diagnosis present

## 2015-08-18 DIAGNOSIS — D72829 Elevated white blood cell count, unspecified: Secondary | ICD-10-CM | POA: Diagnosis present

## 2015-08-18 DIAGNOSIS — D630 Anemia in neoplastic disease: Secondary | ICD-10-CM | POA: Diagnosis present

## 2015-08-18 DIAGNOSIS — C775 Secondary and unspecified malignant neoplasm of intrapelvic lymph nodes: Secondary | ICD-10-CM | POA: Diagnosis present

## 2015-08-18 DIAGNOSIS — R29810 Facial weakness: Secondary | ICD-10-CM | POA: Diagnosis present

## 2015-08-18 DIAGNOSIS — C801 Malignant (primary) neoplasm, unspecified: Secondary | ICD-10-CM

## 2015-08-18 DIAGNOSIS — C772 Secondary and unspecified malignant neoplasm of intra-abdominal lymph nodes: Secondary | ICD-10-CM | POA: Diagnosis not present

## 2015-08-18 DIAGNOSIS — I700 Atherosclerosis of aorta: Secondary | ICD-10-CM | POA: Diagnosis present

## 2015-08-18 DIAGNOSIS — B964 Proteus (mirabilis) (morganii) as the cause of diseases classified elsewhere: Secondary | ICD-10-CM | POA: Diagnosis present

## 2015-08-18 DIAGNOSIS — E872 Acidosis: Secondary | ICD-10-CM | POA: Diagnosis present

## 2015-08-18 DIAGNOSIS — E46 Unspecified protein-calorie malnutrition: Secondary | ICD-10-CM | POA: Diagnosis present

## 2015-08-18 DIAGNOSIS — R410 Disorientation, unspecified: Secondary | ICD-10-CM | POA: Diagnosis present

## 2015-08-18 DIAGNOSIS — E43 Unspecified severe protein-calorie malnutrition: Secondary | ICD-10-CM | POA: Diagnosis present

## 2015-08-18 DIAGNOSIS — C799 Secondary malignant neoplasm of unspecified site: Secondary | ICD-10-CM | POA: Insufficient documentation

## 2015-08-18 DIAGNOSIS — I2510 Atherosclerotic heart disease of native coronary artery without angina pectoris: Secondary | ICD-10-CM | POA: Diagnosis present

## 2015-08-18 DIAGNOSIS — E162 Hypoglycemia, unspecified: Secondary | ICD-10-CM | POA: Diagnosis not present

## 2015-08-18 DIAGNOSIS — N179 Acute kidney failure, unspecified: Secondary | ICD-10-CM | POA: Diagnosis present

## 2015-08-18 DIAGNOSIS — M25561 Pain in right knee: Secondary | ICD-10-CM | POA: Diagnosis present

## 2015-08-18 DIAGNOSIS — Z6823 Body mass index (BMI) 23.0-23.9, adult: Secondary | ICD-10-CM | POA: Diagnosis not present

## 2015-08-18 DIAGNOSIS — N814 Uterovaginal prolapse, unspecified: Secondary | ICD-10-CM | POA: Diagnosis present

## 2015-08-18 DIAGNOSIS — N1330 Unspecified hydronephrosis: Secondary | ICD-10-CM | POA: Diagnosis present

## 2015-08-18 DIAGNOSIS — Z87891 Personal history of nicotine dependence: Secondary | ICD-10-CM | POA: Diagnosis not present

## 2015-08-18 DIAGNOSIS — I679 Cerebrovascular disease, unspecified: Secondary | ICD-10-CM | POA: Diagnosis present

## 2015-08-18 HISTORY — DX: Encephalopathy, unspecified: G93.40

## 2015-08-18 HISTORY — DX: Uterovaginal prolapse, unspecified: N81.4

## 2015-08-18 HISTORY — DX: Other specified postprocedural states: Z98.890

## 2015-08-18 HISTORY — DX: Abnormal uterine and vaginal bleeding, unspecified: N93.9

## 2015-08-18 LAB — DIFFERENTIAL
BASOS ABS: 0.1 10*3/uL (ref 0.0–0.1)
Basophils Relative: 0 %
EOS PCT: 0 %
Eosinophils Absolute: 0 10*3/uL (ref 0.0–0.7)
LYMPHS ABS: 0.9 10*3/uL (ref 0.7–4.0)
LYMPHS PCT: 4 %
Monocytes Absolute: 1.1 10*3/uL — ABNORMAL HIGH (ref 0.1–1.0)
Monocytes Relative: 6 %
NEUTROS PCT: 90 %
Neutro Abs: 17.4 10*3/uL — ABNORMAL HIGH (ref 1.7–7.7)

## 2015-08-18 LAB — CBC WITH DIFFERENTIAL/PLATELET
Basophils Absolute: 0 10*3/uL (ref 0.0–0.1)
Basophils Relative: 0 %
EOS ABS: 0 10*3/uL (ref 0.0–0.7)
Eosinophils Relative: 0 %
HCT: 30.8 % — ABNORMAL LOW (ref 36.0–46.0)
HEMOGLOBIN: 10 g/dL — AB (ref 12.0–15.0)
LYMPHS ABS: 0.9 10*3/uL (ref 0.7–4.0)
Lymphocytes Relative: 5 %
MCH: 29.5 pg (ref 26.0–34.0)
MCHC: 32.5 g/dL (ref 30.0–36.0)
MCV: 90.9 fL (ref 78.0–100.0)
Monocytes Absolute: 0.9 10*3/uL (ref 0.1–1.0)
Monocytes Relative: 5 %
NEUTROS PCT: 90 %
Neutro Abs: 17.3 10*3/uL — ABNORMAL HIGH (ref 1.7–7.7)
Platelets: 506 10*3/uL — ABNORMAL HIGH (ref 150–400)
RBC: 3.39 MIL/uL — AB (ref 3.87–5.11)
RDW: 16.5 % — ABNORMAL HIGH (ref 11.5–15.5)
WBC: 19.2 10*3/uL — AB (ref 4.0–10.5)

## 2015-08-18 LAB — URINALYSIS, ROUTINE W REFLEX MICROSCOPIC
Bilirubin Urine: NEGATIVE
Glucose, UA: NEGATIVE mg/dL
KETONES UR: NEGATIVE mg/dL
LEUKOCYTES UA: NEGATIVE
NITRITE: NEGATIVE
PROTEIN: NEGATIVE mg/dL
Specific Gravity, Urine: 1.008 (ref 1.005–1.030)
Urobilinogen, UA: 0.2 mg/dL (ref 0.0–1.0)
pH: 5 (ref 5.0–8.0)

## 2015-08-18 LAB — COMPREHENSIVE METABOLIC PANEL
ALT: 17 U/L (ref 14–54)
ALT: 17 U/L (ref 14–54)
ANION GAP: 7 (ref 5–15)
AST: 33 U/L (ref 15–41)
AST: 30 U/L (ref 15–41)
Albumin: 2.7 g/dL — ABNORMAL LOW (ref 3.5–5.0)
Albumin: 2.4 g/dL — ABNORMAL LOW (ref 3.5–5.0)
Alkaline Phosphatase: 75 U/L (ref 38–126)
Alkaline Phosphatase: 65 U/L (ref 38–126)
Anion gap: 10 (ref 5–15)
BUN: 24 mg/dL — ABNORMAL HIGH (ref 6–20)
BUN: 28 mg/dL — AB (ref 6–20)
CALCIUM: 13.1 mg/dL — AB (ref 8.9–10.3)
CHLORIDE: 120 mmol/L — AB (ref 101–111)
CO2: 20 mmol/L — AB (ref 22–32)
CO2: 20 mmol/L — ABNORMAL LOW (ref 22–32)
CREATININE: 1.64 mg/dL — AB (ref 0.44–1.00)
Chloride: 112 mmol/L — ABNORMAL HIGH (ref 101–111)
Creatinine, Ser: 1.47 mg/dL — ABNORMAL HIGH (ref 0.44–1.00)
GFR calc Af Amer: 35 mL/min — ABNORMAL LOW (ref >60)
GFR calc non Af Amer: 35 mL/min — ABNORMAL LOW (ref >60)
GFR, EST AFRICAN AMERICAN: 40 mL/min — AB (ref >60)
GFR, EST NON AFRICAN AMERICAN: 30 mL/min — AB (ref >60)
Glucose, Bld: 88 mg/dL (ref 65–99)
Glucose, Bld: 82 mg/dL (ref 65–99)
POTASSIUM: 4 mmol/L (ref 3.5–5.1)
Potassium: 4.2 mmol/L (ref 3.5–5.1)
SODIUM: 142 mmol/L (ref 135–145)
SODIUM: 147 mmol/L — AB (ref 135–145)
TOTAL PROTEIN: 8.1 g/dL (ref 6.5–8.1)
Total Bilirubin: 0.8 mg/dL (ref 0.3–1.2)
Total Bilirubin: 0.7 mg/dL (ref 0.3–1.2)
Total Protein: 7.3 g/dL (ref 6.5–8.1)

## 2015-08-18 LAB — URINE MICROSCOPIC-ADD ON

## 2015-08-18 LAB — CBC
HEMATOCRIT: 35.5 % — AB (ref 36.0–46.0)
Hemoglobin: 11.6 g/dL — ABNORMAL LOW (ref 12.0–15.0)
MCH: 29.5 pg (ref 26.0–34.0)
MCHC: 32.7 g/dL (ref 30.0–36.0)
MCV: 90.3 fL (ref 78.0–100.0)
PLATELETS: 484 10*3/uL — AB (ref 150–400)
RBC: 3.93 MIL/uL (ref 3.87–5.11)
RDW: 16.4 % — AB (ref 11.5–15.5)
WBC: 19.4 10*3/uL — AB (ref 4.0–10.5)

## 2015-08-18 LAB — MAGNESIUM
MAGNESIUM: 1.6 mg/dL — AB (ref 1.7–2.4)
Magnesium: 2.3 mg/dL (ref 1.7–2.4)

## 2015-08-18 LAB — LACTIC ACID, PLASMA
LACTIC ACID, VENOUS: 2.4 mmol/L — AB (ref 0.5–2.0)
LACTIC ACID, VENOUS: 2.4 mmol/L — AB (ref 0.5–2.0)
LACTIC ACID, VENOUS: 3.3 mmol/L — AB (ref 0.5–2.0)
LACTIC ACID, VENOUS: 1.8 mmol/L (ref 0.5–2.0)
Lactic Acid, Venous: 2.7 mmol/L (ref 0.5–2.0)

## 2015-08-18 LAB — GLUCOSE, CAPILLARY: Glucose-Capillary: 92 mg/dL (ref 65–99)

## 2015-08-18 LAB — MRSA PCR SCREENING: MRSA BY PCR: NEGATIVE

## 2015-08-18 MED ORDER — PIPERACILLIN-TAZOBACTAM 3.375 G IVPB 30 MIN
3.3750 g | Freq: Once | INTRAVENOUS | Status: AC
  Administered 2015-08-18: 3.375 g via INTRAVENOUS
  Filled 2015-08-18: qty 50

## 2015-08-18 MED ORDER — CALCITONIN (SALMON) 200 UNIT/ML IJ SOLN
254.0000 [IU] | Freq: Two times a day (BID) | INTRAMUSCULAR | Status: AC
  Administered 2015-08-18 – 2015-08-19 (×3): 254 [IU] via INTRAMUSCULAR
  Filled 2015-08-18 (×3): qty 1.27

## 2015-08-18 MED ORDER — PIPERACILLIN-TAZOBACTAM 3.375 G IVPB
3.3750 g | Freq: Three times a day (TID) | INTRAVENOUS | Status: DC
  Administered 2015-08-18 – 2015-08-20 (×6): 3.375 g via INTRAVENOUS
  Filled 2015-08-18 (×9): qty 50

## 2015-08-18 MED ORDER — MAGNESIUM SULFATE 2 GM/50ML IV SOLN
2.0000 g | Freq: Once | INTRAVENOUS | Status: AC
  Administered 2015-08-18: 2 g via INTRAVENOUS
  Filled 2015-08-18: qty 50

## 2015-08-18 MED ORDER — SODIUM CHLORIDE 0.9 % IV BOLUS (SEPSIS)
1000.0000 mL | Freq: Once | INTRAVENOUS | Status: AC
  Administered 2015-08-18: 1000 mL via INTRAVENOUS

## 2015-08-18 NOTE — Progress Notes (Signed)
Patient ID: Allison Chapman, female   DOB: [birth year], 69 y.o.   MRN: [id number]   Subjective: Allison Chapman was quite somnolent and confused when we saw her this morning. Her daughter believes she is improving. She reiterated that she has facial droop. The family would like to get the ball rolling on her cancer work-up.  Objective: Vital signs in last 24 hours: Filed Vitals:   08/17/15 2300 08/18/15 0123 08/18/15 0553 08/18/15 0900  BP: 157/85 130/80 131/80   Pulse:  107 106   Temp:  97.6 F (36.4 C) 97.9 F (36.6 C) 97.1 F (36.2 C)  TempSrc:  Oral Oral Oral  Resp: 18 14 15    Height:      Weight:      SpO2: 98%  96%    General: Frail African-American lady resting in bed HEENT: no scleral icterus. EOMI. Subtle right-sided facial droop. Cardiac: Tachycardic but regular, no rubs, murmurs or gallops Pulm: Breathing well, clear to auscultation bilaterally Abd: soft, nontender, nondistended, BS present Ext: warm and well perfused, no pedal edema Lymph: no cervical LAD Skin: no rash Neuro: cranial nerves II-XII grossly intact. Strength equal on distal extremities, but weak secondary to poor understanding of our requests.  Lab Results: Basic Metabolic Panel:  Recent Labs Lab 08/17/15 2128 08/18/15 0029 08/18/15 0626  NA  --  142 147*  K  --  4.2 4.0  CL  --  112* 120*  CO2  --  20* 20*  GLUCOSE  --  88 82  BUN  --  28* 24*  CREATININE  --  1.64* 1.47*  CALCIUM  --  >15.0* 13.1*  MG 1.8 1.6* 2.3  PHOS 3.9  --   --    Liver Function Tests:  Recent Labs Lab 08/18/15 0029 08/18/15 0626  AST 33 30  ALT 17 17  ALKPHOS 75 65  BILITOT 0.8 0.7  PROT 8.1 7.3  ALBUMIN 2.7* 2.4*   CBC:  Recent Labs Lab 08/18/15 0029 08/18/15 0626  WBC 19.4* 19.2*  NEUTROABS 17.4* 17.3*  HGB 11.6* 10.0*  HCT 35.5* 30.8*  MCV 90.3 90.9  PLT 484* 506*   Studies/Results: Dg Chest 2 View  08/17/2015  CLINICAL DATA:  Progressive altered mental status. EXAM: CHEST  2 VIEW  COMPARISON:  07/31/2015 FINDINGS: Heart size and pulmonary vascularity are normal. Minimal atelectasis at the lung bases due to a shallow inspiration. The lungs are otherwise clear. No osseous abnormality. IMPRESSION: No active cardiopulmonary disease. Electronically Signed   By: Lorriane Shire M.D.   On: 08/17/2015 19:54   Ct Head Wo Contrast  08/17/2015  CLINICAL DATA:  Right-sided facial droop and right-sided body weakness. Confusion. Symptoms present for 2 days and worsening. EXAM: CT HEAD WITHOUT CONTRAST TECHNIQUE: Contiguous axial images were obtained from the base of the skull through the vertex without intravenous contrast. COMPARISON:  07/31/2015 FINDINGS: There is no evidence of acute cortical infarct, intracranial hemorrhage, mass, midline shift, or extra-axial fluid collection. Ventricles and sulci are within normal limits for age. Small, chronic infarcts are again seen in the deep gray nuclei and deep cerebral white matter bilaterally. Confluent hypodensities in the subcortical and deep cerebral white matter bilaterally are unchanged. The visualized portions of the orbits are unremarkable. No significant inflammatory change in the visualized paranasal sinuses. The no destructive skull lesions. Calcified atherosclerosis at the skullbase. IMPRESSION: 1. No evidence of acute intracranial abnormality. 2. 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 3. Unchanged cerebral  white matter disease, nonspecific but may reflect a advanced chronic small vessel change. Electronically Signed   By: Logan Bores M.D.   On: 08/17/2015 20:04   Medications: I have reviewed the patient's current medications. Scheduled Meds: . heparin  5,000 Units Subcutaneous 3 times per day  . piperacillin-tazobactam (ZOSYN)  IV  3.375 g Intravenous Q8H  . sodium chloride  3 mL Intravenous Q12H   Continuous Infusions: . sodium chloride 200 mL/hr at 08/18/15 0620   PRN  Meds:.  Assessment/Plan:  Altered mental status: Differential includes hypercalcemia, occult infection, CVA, per below.  Hypercalcemia: This is the most likely cause of her altered mental status, and is likely from her SCC.  -Continue NS at 200cc/hr -Pamidronate 60 mg IV -BMP tomorrow am  Leukocytosis and lactic acidosis without a clear source: Urinalysis was clean but she has a prolapsed uterus and had gram negative rod sepsis last admission. We'll go ahead and start Zosyn for additional Pseudomonas coverage given her recent hospitalization. -Started Zosyn per pharmacy -Follow blood cultures -Trend lactic acid  Right-sided facial droop: This is new since yesterday and certainly present on exam, albeit improved per her daughter. We'll get a head MRI to further characterize. -Head MRI  Metastatic Squamous Cell Carcinoma, Unknown Primary, suspicious for anorectal SCC: Patient was set up for follow-up with the multi-disciplinary oncology clinic. Her new Oncologist will drop by to see her today. -She'll need new oncology appointments  AKI: Creatinine improving with fluids. -Fluid resuscitation per above  Dispo: Disposition is deferred at this time, awaiting improvement of current medical problems.  The patient does have a current PCP (Carly Montey Hora, MD) and does need an [facility] hospital follow-up appointment after discharge.  The patient does have transportation limitations that hinder transportation to clinic appointments.  .Services Needed at time of discharge: Y = Yes, Blank = No PT:   OT:   RN:   Equipment:   Other:     LOS: 1 day   [name], MD 08/18/2015, 11:59 AM

## 2015-08-18 NOTE — Progress Notes (Signed)
CRITICAL VALUE ALERT  Critical value received:  2.4 Date of notification: 08/18/15 Time of notification: 0626 Critical value read back: Yes Nurse who received alert:  Venetia Constable MD notified (1st page):  Dr. Melburn Hake Time of first page:  1350 MD notified (2nd page):  Time of second page:  Responding MD:  Dr. Melburn Hake Time MD responded:  [phone]

## 2015-08-18 NOTE — Consult Note (Addendum)
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  Patient had recently been admitted on 10/6  to hospital with hypercalcemia with Ca levels 14.9 corrected, requiring biophosphonate and calcitonin  and IVF, diuresis. She was discharged on 10/13 in stable condition.  During that admission, a CT of the abdomen and pelvis was remarkable for bilateral external iliac nodal masses with necrosis, 4-5 cm in diameter, worrisome for metastatic malignancy. There is an additional necrotic node in the abdominal retroperitoneum to the left of the aorta.suspicious for malignancy.  Biopsy of the right iliac lymph node on 10/12 revealed Metastatic Squamous Cell Carcinoma of unknown primary.   She was to have a formal Oncology evaluation on 10/27, but was admitted with recurrent symptoms of malignant hypercalcemia, with Ca levels again >15. She was confused. No seizures were noted. No fevers, chills, night sweats, vision changes, or mucositis. No respiratory complaints. No chest pain or palpitations. No lower extremity swelling. No nausea, heartburn or change in bowel habits. Appetite is decreased.  She had a 15 lb weight loss over the last year. No dysuria. No abnormal skin rashes, or neuropathy. No bleeding issues such as epistaxis, hematemesis, hematuria or hematochezia. She has a  history of vagina bleeding over the last 6 months. CT head was negative. MRI brain shows chronic hemorrhages. She is not on anticoagulation. She received IV biophosphonate, calcitonin, IV fluids, and careful electrolyte monitoring, with some improvement, currently at 13. She also had AKI, and anemia of bleeding and chronic disease  We were requested per family, to see the patient in consultation with recommendations.   Past medical history:      Past Medical History  Diagnosis Date  . Hypertension   . Encephalopathy acute   . Prolapsed uterus     stage IV  . Vaginal bleeding   . Hx of lymph node biopsy 08/05/2015    right iliac=Metastatic squamous cell carcinoma    Past surgical history:      Past Surgical History  Procedure Laterality Date  . Tubal ligation      Medications:  Prior to Admission:  Prescriptions prior to admission  Medication Sig Dispense Refill Last Dose  . pravastatin (PRAVACHOL) 20 MG tablet Take 1 tablet (20 mg total) by mouth daily. 30 tablet 1 08/16/2015 at Unknown time  . sulfamethoxazole-trimethoprim (BACTRIM DS,SEPTRA DS) 800-160 MG tablet Take 1 tablet by mouth every 12 (twelve) hours. 21 tablet 0 08/16/2015 at Unknown time    PRN:  Allergies: No Known Allergies  Family history:     Family History  Problem Relation Age of Onset  . Hypertension Mother   . Cancer Mother   . Lymphoma Mother   . Hyperthyroidism Daughter  Social history: Social History   Social History  . Marital Status: Divorced    Spouse Name: N/A  . Number of Children: N/A  . Years of Education: N/A   Occupational History  . Not on file.   Social History Main Topics  . Smoking status: Never Smoker   . Smokeless tobacco: Not on file  . Alcohol Use: Not on file  . Drug Use: Not on file  . Sexual Activity: Not on file   Other Topics Concern  . Not on file   Social History Narrative    ROS: See HPI.     Physical Exam    Filed Vitals:   08/18/15 1300  BP:   Pulse:   Temp: 98.3 F (36.8 C)  Resp:    Filed Weights   08/17/15 2218  Weight: 125 lb 10.6 oz (57 kg)    GENERAL: lethargic no distress and comfortable SKIN: skin color, texture, turgor are normal, no rashes or significant lesions EYES: normal, conjunctiva are pink and non-injected, sclera clear OROPHARYNX:no exudate, no erythema and lips, buccal mucosa, and tongue normal  NECK: supple, thyroid normal size, non-tender, without nodularity LYMPH:  no palpable lymphadenopathy in the cervical, axillary or inguinal LUNGS: clear to auscultation and percussion with normal breathing effort HEART: regular rate & rhythm and no murmurs and no lower extremity edema ABDOMEN soft, non-tender and normal bowel sounds Musculoskeletal:no cyanosis of digits and no clubbing  PSYCH: patient is confused  NEURO: no focal motor/sensory deficits except for mild tight facial droop.   Lab results:       CBC  Recent Labs Lab 08/17/15 1838 08/17/15 1851 08/18/15 0029 08/18/15 0626  WBC 18.8*  --  19.4* 19.2*  HGB 11.7* 14.6 11.6* 10.0*  HCT 36.2 43.0 35.5* 30.8*  PLT 493*  --  484* 506*  MCV 90.0  --  90.3 90.9  MCH 29.1  --  29.5 29.5  MCHC 32.3  --  32.7 32.5  RDW 16.3*  --  16.4* 16.5*  LYMPHSABS  --   --  0.9 0.9  MONOABS  --   --  1.1* 0.9  EOSABS  --   --  0.0 0.0  BASOSABS  --   --  0.1 0.0    Anemia panel:  No results for input(s): VITAMINB12, FOLATE, FERRITIN, TIBC, IRON, RETICCTPCT in the last 72 hours.   Chemistries   Recent Labs Lab 08/17/15 1838 08/17/15 1851 08/17/15 2128 08/18/15 0029 08/18/15 0626  NA 138 139  --  142 147*  K 5.1 5.7*  --  4.2 4.0  CL 104 113*  --  112* 120*  CO2 19*  --   --  20* 20*  GLUCOSE 87 89  --  88 82  BUN 31* 45*  --  28* 24*  CREATININE 1.88* 2.00*  --  1.64* 1.47*  CALCIUM >15.0*  --   --  >15.0* 13.1*  MG  --   --  1.8 1.6* 2.3     Coagulation profile No  results for input(s): INR, PROTIME in the last 168 hours.    Studies:      Dg Chest 2 View  08/17/2015  CLINICAL DATA:  Progressive altered mental status. EXAM: CHEST  2 VIEW COMPARISON:  07/31/2015 FINDINGS: Heart size and pulmonary vascularity are normal. Minimal atelectasis at the lung bases due to a shallow inspiration. The lungs are otherwise clear. No osseous abnormality. IMPRESSION: No active cardiopulmonary disease. Electronically Signed   By: Jeneen Rinks  Maxwell M.D.   On: 08/17/2015 19:54   Dg Chest 2 View  07/31/2015  CLINICAL DATA:  [place] this morning, hypertension, weakness in legs for 2 months EXAM: CHEST  2 VIEW COMPARISON:  None FINDINGS: Normal heart size, mediastinal contours and pulmonary vascularity. Calcification and mild tortuosity of thoracic aorta. Mild bibasilar atelectasis. Lungs otherwise clear. No pleural effusion or pneumothorax. Bones demineralized. IMPRESSION: Mild bibasilar atelectasis. Electronically Signed   By: Lavonia Dana M.D.   On: 07/31/2015 16:00   Ct Head Wo Contrast  08/17/2015  CLINICAL DATA:  Right-sided facial droop and right-sided body weakness. Confusion. Symptoms present for 2 days and worsening. EXAM: CT HEAD WITHOUT CONTRAST TECHNIQUE: Contiguous axial images were obtained from the base of the skull through the vertex without intravenous contrast. COMPARISON:  07/31/2015 FINDINGS: There is no evidence of acute cortical infarct, intracranial hemorrhage, mass, midline shift, or extra-axial fluid collection. Ventricles and sulci are within normal limits for age. Small, chronic infarcts are again seen in the deep gray nuclei and deep cerebral white matter bilaterally. Confluent hypodensities in the subcortical and deep cerebral white matter bilaterally are unchanged. The visualized portions of the orbits are unremarkable. No significant inflammatory change in the visualized paranasal sinuses. The no destructive skull lesions. Calcified atherosclerosis at the  skullbase. IMPRESSION: 1. No evidence of acute intracranial abnormality. 2. 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 3. Unchanged cerebral white matter disease, nonspecific but may reflect a advanced chronic small vessel change. Electronically Signed   By: Logan Bores M.D.   On: 08/17/2015 20:04   Ct Head Wo Contrast  07/31/2015  CLINICAL DATA:  69YOF right knee gave out on her today and she lowered herself to the floor. Per EMS pts family stated that pt has been slow to respond today and that she has a Possible right side facial droop. EMS noted foul smelling urine EXAM: CT HEAD WITHOUT CONTRAST TECHNIQUE: Contiguous axial images were obtained from the base of the skull through the vertex without intravenous contrast. COMPARISON:  None. FINDINGS: Probable retention cysts or polyps in the maxillary sinuses, incompletely visualized. Atherosclerotic and physiologic intracranial calcifications. Early right basal ganglia mineralization. Diffuse parenchymal atrophy. Patchy areas of hypoattenuation in deep and periventricular white matter bilaterally. Negative for acute intracranial hemorrhage, mass lesion, acute infarction, midline shift, or mass-effect. Acute infarct may be inapparent on noncontrast CT. Ventricles and sulci symmetric. Bone windows demonstrate no focal lesion. IMPRESSION: 1. Negative for bleed or other acute intracranial process. 2. Atrophy and nonspecific white matter changes. Electronically Signed   By: Lucrezia Europe M.D.   On: 07/31/2015 16:14   Ct Chest W Contrast  08/03/2015  CLINICAL DATA:  69 year old female with clinical concern for malignancy. Vaginal bleeding. Hypercalcemia. History of uterine prolapse. EXAM: CT CHEST AND ABDOMEN WITH CONTRAST TECHNIQUE: Multidetector CT imaging of the chest and abdomen was performed following the standard protocol during bolus administration of intravenous contrast. CONTRAST:  131mL OMNIPAQUE IOHEXOL 300 MG/ML  SOLN  COMPARISON:  Pelvic CT 08/02/2015. FINDINGS: CT CHEST FINDINGS Mediastinum/Lymph Nodes: Heart size is normal. There is no significant pericardial fluid, thickening or pericardial calcification. There is atherosclerosis of the thoracic aorta, the great vessels of the mediastinum and the coronary arteries, including calcified atherosclerotic plaque in the left main, left anterior descending and left circumflex coronary arteries. No pathologically enlarged mediastinal or hilar lymph nodes. Small hiatal hernia. No axillary lymphadenopathy. Lungs/Pleura: 4 mm subpleural nodule in the anterior aspect of the right middle  lobe (image 31 of series 205). A few other scattered 2-3 mm nodules are noted in the left upper lobe, but are highly nonspecific. Larger more suspicious appearing pulmonary nodules or masses are otherwise noted. There is a background of moderate centrilobular emphysema. No acute consolidative airspace disease. No pleural effusions. Bibasilar areas of subsegmental atelectasis and/or scarring are noted dependently. Musculoskeletal/Soft Tissues: There are no aggressive appearing lytic or blastic lesions noted in the visualized portions of the skeleton. CT ABDOMEN FINDINGS Hepatobiliary: Sub cm low-attenuation lesion segment 7 of the liver is too small to definitively characterize, but is statistically likely a tiny cyst. No other suspicious cystic or solid hepatic lesions are noted. Dependent high attenuation material within the gallbladder is most compatible with biliary sludge. No current findings to suggest an acute cholecystitis at this time. Pancreas: No pancreatic mass. No pancreatic ductal dilatation. No pancreatic or peripancreatic fluid or inflammatory changes. Spleen: Unremarkable. Adrenals/Urinary Tract: Numerous well-defined low-attenuation lesions in the kidneys bilaterally, compatible with simple cysts, largest of which measures up to 7.9 cm in the lower pole of the left kidney. Moderate to  severe bilateral hydroureteronephrosis in the visualized portions of the abdomen. Bilateral adrenal glands are normal in appearance. Stomach/Bowel: Normal appearance of the stomach. No pathologic dilatation of visualized portions of small bowel or colon. A few scattered colonic diverticulae are noted, without surrounding inflammatory changes to suggest an acute diverticulitis in the visualized portions of the abdomen at this time. Vascular/Lymphatic: Extensive atherosclerosis throughout the abdominal vasculature, without evidence of aneurysm or dissection in the visualized abdomen. Numerous borderline enlarged and mildly enlarged retroperitoneal lymph nodes measuring up to 12 mm in short axis are noted in the left para-aortic nodal station. Other: No significant volume of ascites in the visualized peritoneal cavity. No pneumoperitoneum. Musculoskeletal: There are no aggressive appearing lytic or blastic lesions noted in the visualized portions of the skeleton. Compression fracture of superior endplate of L2 with approximately 30% loss of central vertebral body height. IMPRESSION: 1. While there is some lymphadenopathy noted in the retroperitoneum, this is nonspecific. This could be reactive given the patient's uterine prolapse and associated bilateral hydroureteronephrosis. However, given the patient's prominent bilateral adnexal structures, which could represent lymph nodes or ovarian tissue, underlying malignancy is suspected. Further clinical correlation is suggested. 2. No other definite evidence of extra nodal metastatic disease in the abdomen. 3. There are several small nonspecific pulmonary nodules in the lungs bilaterally. While metastatic disease is not excluded, it is not strongly favored. The largest of these nodules is a 4 mm subpleural nodule in the anterior aspect of the right middle lobe (image 31 of series 205). Attention on any future followup studies is recommended to ensure stability. At the very  least, a repeat noncontrast chest CT should be performed in 12 months. 4. Atherosclerosis, including left main and 2 vessel coronary artery disease. Assessment for potential risk factor modification, dietary therapy or pharmacologic therapy may be warranted, if clinically indicated. 5. Biliary sludge lying dependently in the gallbladder. No current findings to suggest an acute cholecystitis at this time. 6. Additional incidental findings, as above. Electronically Signed   By: Vinnie Langton M.D.   On: 08/03/2015 18:25   Ct Pelvis W Contrast  08/02/2015  CLINICAL DATA:  Vaginal bleeding.  Hypercalcemia.  Uterine prolapse. EXAM: CT PELVIS WITH CONTRAST TECHNIQUE: Multidetector CT imaging of the pelvis was performed using the standard protocol following the bolus administration of intravenous contrast. CONTRAST:  161mL OMNIPAQUE IOHEXOL 300 MG/ML  SOLN  COMPARISON:  None. FINDINGS: I think there is complete prolapse of the vagina and uterus. The uterus is small, as expected at this age. Within the prolapse, I think that the posterior portion of the bladder also comes along with it. Anterior portion of the bladder retains a more normal position. The ureters are dilated to the point where they enter the prolapse on their way to the posterior portion of the bladder. There is undoubtedly extrinsic compression of the ureters at this point. Complicating this case, there are bilateral necrotic nodal masses along both external iliac chains, measuring approximately 4-5 cm in size. This is a very worrisome finding and suggests metastatic malignancy. At the upper portion of this pelvic scan, I think there is also a necrotic node measuring 15 mm in diameter to the left of the aorta on image 3. There is advanced atherosclerosis of the aorta and the iliac and femoral vessels. There is ordinary degenerative change of the lower lumbar spine. There is an old compression deformity of L4. IMPRESSION: Complete prolapse of the uterus  which also brings with it the posterior portion of the bladder. The central portion of the bladder is collapsed and the anterior portion of the bladder retains a more normal position. Both ureters are dilated due to extrinsic compression as they enter the prolapse on their way to the ureterovesical junctions in the portion of the bladder that is prolapsed. Bilateral external iliac nodal masses with necrosis, 4-5 cm in diameter. This is very worrisome for metastatic malignancy. There is an additional necrotic node in the abdominal retroperitoneum to the left of the aorta on image 3. Electronically Signed   By: Nelson Chimes M.D.   On: 08/02/2015 17:03   Ct Abdomen W Contrast  08/03/2015  CLINICAL DATA:  69 year old female with clinical concern for malignancy. Vaginal bleeding. Hypercalcemia. History of uterine prolapse. EXAM: CT CHEST AND ABDOMEN WITH CONTRAST TECHNIQUE: Multidetector CT imaging of the chest and abdomen was performed following the standard protocol during bolus administration of intravenous contrast. CONTRAST:  157mL OMNIPAQUE IOHEXOL 300 MG/ML  SOLN COMPARISON:  Pelvic CT 08/02/2015. FINDINGS: CT CHEST FINDINGS Mediastinum/Lymph Nodes: Heart size is normal. There is no significant pericardial fluid, thickening or pericardial calcification. There is atherosclerosis of the thoracic aorta, the great vessels of the mediastinum and the coronary arteries, including calcified atherosclerotic plaque in the left main, left anterior descending and left circumflex coronary arteries. No pathologically enlarged mediastinal or hilar lymph nodes. Small hiatal hernia. No axillary lymphadenopathy. Lungs/Pleura: 4 mm subpleural nodule in the anterior aspect of the right middle lobe (image 31 of series 205). A few other scattered 2-3 mm nodules are noted in the left upper lobe, but are highly nonspecific. Larger more suspicious appearing pulmonary nodules or masses are otherwise noted. There is a background of  moderate centrilobular emphysema. No acute consolidative airspace disease. No pleural effusions. Bibasilar areas of subsegmental atelectasis and/or scarring are noted dependently. Musculoskeletal/Soft Tissues: There are no aggressive appearing lytic or blastic lesions noted in the visualized portions of the skeleton. CT ABDOMEN FINDINGS Hepatobiliary: Sub cm low-attenuation lesion segment 7 of the liver is too small to definitively characterize, but is statistically likely a tiny cyst. No other suspicious cystic or solid hepatic lesions are noted. Dependent high attenuation material within the gallbladder is most compatible with biliary sludge. No current findings to suggest an acute cholecystitis at this time. Pancreas: No pancreatic mass. No pancreatic ductal dilatation. No pancreatic or peripancreatic fluid or inflammatory changes. Spleen: Unremarkable.  Adrenals/Urinary Tract: Numerous well-defined low-attenuation lesions in the kidneys bilaterally, compatible with simple cysts, largest of which measures up to 7.9 cm in the lower pole of the left kidney. Moderate to severe bilateral hydroureteronephrosis in the visualized portions of the abdomen. Bilateral adrenal glands are normal in appearance. Stomach/Bowel: Normal appearance of the stomach. No pathologic dilatation of visualized portions of small bowel or colon. A few scattered colonic diverticulae are noted, without surrounding inflammatory changes to suggest an acute diverticulitis in the visualized portions of the abdomen at this time. Vascular/Lymphatic: Extensive atherosclerosis throughout the abdominal vasculature, without evidence of aneurysm or dissection in the visualized abdomen. Numerous borderline enlarged and mildly enlarged retroperitoneal lymph nodes measuring up to 12 mm in short axis are noted in the left para-aortic nodal station. Other: No significant volume of ascites in the visualized peritoneal cavity. No pneumoperitoneum.  Musculoskeletal: There are no aggressive appearing lytic or blastic lesions noted in the visualized portions of the skeleton. Compression fracture of superior endplate of L2 with approximately 30% loss of central vertebral body height. IMPRESSION: 1. While there is some lymphadenopathy noted in the retroperitoneum, this is nonspecific. This could be reactive given the patient's uterine prolapse and associated bilateral hydroureteronephrosis. However, given the patient's prominent bilateral adnexal structures, which could represent lymph nodes or ovarian tissue, underlying malignancy is suspected. Further clinical correlation is suggested. 2. No other definite evidence of extra nodal metastatic disease in the abdomen. 3. There are several small nonspecific pulmonary nodules in the lungs bilaterally. While metastatic disease is not excluded, it is not strongly favored. The largest of these nodules is a 4 mm subpleural nodule in the anterior aspect of the right middle lobe (image 31 of series 205). Attention on any future followup studies is recommended to ensure stability. At the very least, a repeat noncontrast chest CT should be performed in 12 months. 4. Atherosclerosis, including left main and 2 vessel coronary artery disease. Assessment for potential risk factor modification, dietary therapy or pharmacologic therapy may be warranted, if clinically indicated. 5. Biliary sludge lying dependently in the gallbladder. No current findings to suggest an acute cholecystitis at this time. 6. Additional incidental findings, as above. Electronically Signed   By: Vinnie Langton M.D.   On: 08/03/2015 18:25   Mr Brain Wo Contrast  08/18/2015  CLINICAL DATA:  Recent hospitalization for encephalopathy due to hypercalcemia. History of squamous cell malignancy of unknown origin. Confusion, speech difficulty. RIGHT-sided facial droop. Initial encounter. EXAM: MRI HEAD WITHOUT CONTRAST TECHNIQUE: Multiplanar, multiecho pulse  sequences of the brain and surrounding structures were obtained without intravenous contrast. COMPARISON:  CT head 08/17/2015.  CT head 07/31/2015. FINDINGS: The patient was unable to remain motionless for the exam. Small or subtle lesions could be overlooked. No evidence for acute infarction, hemorrhage, mass lesion, hydrocephalus, or extra-axial fluid. Moderate cerebral and cerebellar atrophy. Extensive T2 and FLAIR hyperintensity throughout the periventricular and subcortical white matter, representing small vessel disease. Numerous microbleeds throughout the brain including cerebellum, brainstem, and cerebral hemispheres, likely sequelae of hypertensive cerebrovascular disease. Areas of chronic hemorrhagic lacunar infarction are seen in the LEFT thalamus and LEFT lentiform nucleus. Flow voids are maintained in the carotid, basilar, and both vertebral arteries. Other than callosal atrophy, no midline abnormality. Extracranial soft tissues show no acute findings. Within limits for detection on a noncontrast exam, no intracranial metastatic disease. IMPRESSION: Chronic changes as described.  No acute stroke is evident. 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 Electronically Signed   By: Staci Righter M.D.   On: 08/18/2015 12:25   Ct Biopsy  08/05/2015  CLINICAL DATA:  Necrotic lymphadenopathy of the iliac lymph node chains bilaterally. EXAM: CT GUIDED CORE BIOPSY OF RIGHT ILIAC LYMPH NODE ANESTHESIA/SEDATION: 2.0  Mg IV Versed; 50 mcg IV Fentanyl Total Moderate Sedation Time: 12 minutes. PROCEDURE: The procedure risks, benefits, and alternatives were explained to the patient. Questions regarding the procedure were encouraged and answered. The patient understands and consents to the procedure. A time-out was performed prior to the procedure. The right lower abdominal wall was prepped with Betadine in a sterile fashion, and a sterile  drape was applied covering the operative field. A sterile gown and sterile gloves were used for the procedure. Local anesthesia was provided with 1% Lidocaine. CT was performed in a supine position. Under CT guidance, a 17 gauge needle was advanced from an anterior approach into the right pelvis. After confirming needle tip position, a total of 4 separate coaxial 18 gauge core biopsy samples were obtained and submitted in saline. The outer needle was removed and additional CT performed. COMPLICATIONS: None FINDINGS: Core biopsy was performed at the level of enlarged necrotic lymph nodes in the right iliac chain. Anterior approach was performed through the level of the right iliacus muscle. Solid tissue and necrotic appearing tissue was obtained. IMPRESSION: CT-guided core biopsy performed of necrotic lymph node mass in the right iliac chain. Electronically Signed   By: Aletta Edouard M.D.   On: 08/05/2015 17:08   Dg Knee Complete 4 Views Right  07/31/2015  CLINICAL DATA:  Acute right knee pain after falling this morning. Initial encounter. EXAM: RIGHT KNEE - COMPLETE 4+ VIEW COMPARISON:  None. FINDINGS: There is no evidence of fracture, dislocation, or joint effusion. There is no evidence of arthropathy or other focal bone abnormality. Soft tissues are unremarkable. IMPRESSION: Normal right knee. Electronically Signed   By: Marijo Conception, M.D.   On: 07/31/2015 16:01    FINAL ([id number]) REPORT OF SURGICAL PATHOLOGY FINAL DIAGNOSIS Diagnosis Lymph node, needle/core biopsy, Right iliac - METASTATIC SQUAMOUS CELL CARCINOMA. - SEE COMMENT. Enid Cutter MD Pathologist, Electronic Signature (Case signed 08/06/2015) Specimen Gross and Clinical Information   Assessment/Plan:69 y.o. 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 Burr Medico to see patient in consultation later today  and to provide recommendations.   Hypercalcemia Of malignancy She received Pamidronate 60 mg Iv and calcitonin on 10/6 and 10/23, with IVFs and diuresis Current levels are 13 Continue present therapy until levels normalized  Altered mental status This is likely due to hypercalcemia CT head was negative Continue to monitor.  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  No transfusion is indicated at this time Transfuse blood to maintain a Hb of 8 g or if the patient isbleeding Will monitor  Leukocytosis In the setting of recent infection She was started on empiric antibiotics IV Cultures pending No intervention is indicated at this time Will continue to monitor  Malnutrition Consider Nutrition evaluation  AKI Recent UTI AKI is improving with IVF As per primary team  DVT prophylaxis On SCDs   [facility] E, PA-C 08/18/2015  Attending addendum I have seen the patient, examined her. I agree with  the assessment and and plan and have edited the notes.   69 yo female with PMH of HTN, light smoking history, presented with recurrent hypercalcemia, and diffuse RP and iliac adenopathy, biopsy confirmed metastatic SCC, unknown primary. The possible primary including anus, rectum or cervices.    Rectal exam: no visible lesion at the anus verge, no palpable lesion in rectum, no blood on glove finger tip. There is a grape fruit size prolapsed uterine with a few mucosal ulcers.   Recommendations:  1. Please consult GI for sigmoidoscopy or colonoscopy to rule out rectal cancer  2. Please consult GYN oncology for their input about GYN primary  3. I will arrange output PET  4. OK to discharge if her hypercalcemia and AMS resolves, I will follow her closely in my clinic  I will follow up as needed when she in house. Please call me if you have  questions. Thanks for the opportunity to participate her care.  [name]  08/18/2015

## 2015-08-18 NOTE — Progress Notes (Addendum)
CRITICAL VALUE ALERT  Critical value received:  Lactic acid 2.7                                        Calcium  >15  Date of notification: 08/18/15  Time of notification:  0128  Critical value read back: YES  Nurse who received alert:  Sherryl Manges, RN, BSN  MD notified (1st page):  IMTS  Time of first page:  0129  MD notified (2nd page):  Time of second page:  Responding MD:    Time MD responded:

## 2015-08-18 NOTE — Progress Notes (Signed)
ANTIBIOTIC CONSULT NOTE - INITIAL  Pharmacy Consult for Zosyn Indication: rule out sepsis   Labs:  Recent Labs  08/17/15 1838 08/17/15 1851 08/18/15 0029  WBC 18.8*  --  19.4*  HGB 11.7* 14.6 11.6*  PLT 493*  --  484*  CREATININE 1.88* 2.00* 1.64*   Estimated Creatinine Clearance: 23.7 mL/min (by C-G formula based on Cr of 1.64).   Microbiology: Recent Results (from the past 720 hour(s))  Urine culture     Status: None   Collection Time: 07/31/15  6:42 PM  Result Value Ref Range Status   Specimen Description URINE, CATHETERIZED  Final   Special Requests NONE  Final   Culture 7,000 COLONIES/mL INSIGNIFICANT GROWTH  Final   Report Status 08/01/2015 FINAL  Final  Culture, blood (routine x 2)     Status: None   Collection Time: 08/04/15  8:14 AM  Result Value Ref Range Status   Specimen Description BLOOD LEFT ANTECUBITAL  Final   Special Requests IN PEDIATRIC BOTTLE 3CC  Final   Culture NO GROWTH 5 DAYS  Final   Report Status 08/09/2015 FINAL  Final  Culture, blood (routine x 2)     Status: None   Collection Time: 08/04/15  8:24 AM  Result Value Ref Range Status   Specimen Description BLOOD RIGHT FOREARM  Final   Special Requests IN PEDIATRIC BOTTLE 3CC  Final   Culture  Setup Time   Final    GRAM NEGATIVE RODS IN PEDIATRIC BOTTLE CRITICAL RESULT CALLED TO, READ BACK BY AND VERIFIED WITH: TINA ISAACS @1141  08/04/15 MKELLY    Culture PROTEUS MIRABILIS  Final   Report Status 08/06/2015 FINAL  Final   Organism ID, Bacteria PROTEUS MIRABILIS  Final      Susceptibility   Proteus mirabilis - MIC*    AMPICILLIN <=2 SENSITIVE Sensitive     CEFAZOLIN <=4 SENSITIVE Sensitive     CEFEPIME <=1 SENSITIVE Sensitive     CEFTAZIDIME <=1 SENSITIVE Sensitive     CEFTRIAXONE <=1 SENSITIVE Sensitive     CIPROFLOXACIN <=0.25 SENSITIVE Sensitive     GENTAMICIN <=1 SENSITIVE Sensitive     IMIPENEM 2 SENSITIVE Sensitive     TRIMETH/SULFA <=20 SENSITIVE Sensitive      AMPICILLIN/SULBACTAM <=2 SENSITIVE Sensitive     PIP/TAZO <=4 SENSITIVE Sensitive     * PROTEUS MIRABILIS  Culture, blood (routine x 2)     Status: None   Collection Time: 08/07/15  6:40 AM  Result Value Ref Range Status   Specimen Description BLOOD RIGHT HAND  Final   Special Requests BOTTLES DRAWN AEROBIC AND ANAEROBIC 10CC 5CC  Final   Culture NO GROWTH 5 DAYS  Final   Report Status 08/12/2015 FINAL  Final  Culture, blood (routine x 2)     Status: None   Collection Time: 08/07/15  6:46 AM  Result Value Ref Range Status   Specimen Description BLOOD LEFT HAND  Final   Special Requests BOTTLES DRAWN AEROBIC AND ANAEROBIC 10CC  Final   Culture NO GROWTH 5 DAYS  Final   Report Status 08/12/2015 FINAL  Final  MRSA PCR Screening     Status: None   Collection Time: 08/17/15 10:49 PM  Result Value Ref Range Status   MRSA by PCR NEGATIVE NEGATIVE Final    Comment:        The GeneXpert MRSA Assay (FDA approved for NASAL specimens only), is one component of a comprehensive MRSA colonization surveillance program. It is not intended to diagnose  MRSA infection nor to guide or monitor treatment for MRSA infections.     Medical History: Past Medical History  Diagnosis Date  . Hypertension   . Encephalopathy acute   . Prolapsed uterus     stage IV  . Vaginal bleeding   . Hx of lymph node biopsy 08/05/2015    right iliac=Metastatic squamous cell carcinoma     Assessment/Plan:  69yo female presents w/ worsening confusion, found to be hypotensive and tachycardic.  Blood cultures on the 10/14 admission demonstrated Proteus mirabilis bacteremia without a known source. She had been treated with 1 dose ceftriaxone, 2 days of zosyn, and transitioned to Bactrim PO for a 14 day course. However, she had cut the course short due to difficulty swallowing the pills. Blood cultures from 10/13 showed no growth, and UA was unremarkable.  Will begin Zosyn 3.375g IV Q8H and monitor CBC and  Cx.  Wynona Neat, PharmD, BCPS  08/18/2015,7:22 AM

## 2015-08-18 NOTE — Discharge Summary (Signed)
Name: Allison Chapman MRN: [id number] DOB: 02-26-1946 69 y.o. PCP: Juliet Rude, MD  Date of Admission: 08/17/2015  6:12 PM Date of Discharge: 08/21/2015 Attending Physician: Sid Falcon, MD  Discharge Diagnosis: 1. Hypercalcemia of malignancy 2. Leukocytosis 3. Right-sided facial droop with normal brain MRI 4. 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     carvedilol 3.125 MG tablet  Commonly known as:  COREG  Take 1 tablet (3.125 mg total) by mouth 2 (two) times daily with a meal.     feeding supplement (ENSURE ENLIVE) Liqd  Take 237 mLs by mouth 2 (two) times daily between meals.     mirtazapine 15 MG tablet  Commonly known as:  REMERON  Take 1 tablet (15 mg total) by mouth at bedtime.     pravastatin 20 MG tablet  Commonly known as:  PRAVACHOL  Take 1 tablet (20 mg total) by mouth daily.        Disposition and follow-up:   [name] was discharged from [facility] in Good condition.  At the hospital follow up visit please address:  Check her calcium level Ensures she's staying hydrated Consider another Pamidronate infusion Calcitonin demonstrates tachyphylaxis after 2 days so this will have limited utility Monitor her prolapsed uterus for signs of infection  Consultations: Oncology Gastroenterology  Procedures Performed:  Ct Head Wo Contrast  08/17/2015  CLINICAL DATA:  Right-sided facial droop and right-sided body weakness. Confusion. Symptoms present for 2 days and worsening. EXAM: CT HEAD WITHOUT CONTRAST TECHNIQUE: Contiguous axial images were obtained from the base of the  skull through the vertex without intravenous contrast. COMPARISON:  07/31/2015 FINDINGS: There is no evidence of acute cortical infarct, intracranial hemorrhage, mass, midline shift, or extra-axial fluid collection. Ventricles and sulci are within normal limits for age. Small, chronic infarcts are again seen in the deep gray nuclei and deep cerebral white matter bilaterally. Confluent hypodensities in the subcortical and deep cerebral white matter bilaterally are unchanged. The visualized portions of the orbits are unremarkable. No significant inflammatory change in the visualized paranasal sinuses. The no destructive skull lesions. Calcified atherosclerosis at the skullbase. IMPRESSION: 1. No evidence of acute intracranial abnormality. 2. 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 3. Unchanged cerebral white matter disease, nonspecific but may reflect a advanced chronic small vessel change. Electronically Signed   By: Logan Bores M.D.   On: 08/17/2015 20:04   Ct Head Wo Contrast  07/31/2015  CLINICAL DATA:  69YOF right knee gave out on her today and she lowered herself to the floor. Per EMS pts family stated that pt has been slow to respond today and that she has a Possible right side facial droop. EMS noted foul smelling urine EXAM: CT HEAD WITHOUT CONTRAST TECHNIQUE: Contiguous axial images were obtained from the base of the skull through the vertex without intravenous contrast. COMPARISON:  None. FINDINGS: Probable retention cysts or polyps in the maxillary sinuses, incompletely visualized. Atherosclerotic and physiologic intracranial calcifications. Early right basal ganglia mineralization. Diffuse parenchymal atrophy. Patchy areas of hypoattenuation in deep  and periventricular white matter bilaterally. Negative for acute intracranial hemorrhage, mass lesion, acute infarction, midline shift, or mass-effect. Acute infarct may be inapparent on noncontrast CT. Ventricles and  sulci symmetric. Bone windows demonstrate no focal lesion. IMPRESSION: 1. Negative for bleed or other acute intracranial process. 2. Atrophy and nonspecific white matter changes. Electronically Signed   By: Lucrezia Europe M.D.   On: 07/31/2015 16:14   Ct Chest W Contrast  08/03/2015  CLINICAL DATA:  69 year old female with clinical concern for malignancy. Vaginal bleeding. Hypercalcemia. History of uterine prolapse. EXAM: CT CHEST AND ABDOMEN WITH CONTRAST TECHNIQUE: Multidetector CT imaging of the chest and abdomen was performed following the standard protocol during bolus administration of intravenous contrast. CONTRAST:  158mL OMNIPAQUE IOHEXOL 300 MG/ML  SOLN COMPARISON:  Pelvic CT 08/02/2015. FINDINGS: CT CHEST FINDINGS Mediastinum/Lymph Nodes: Heart size is normal. There is no significant pericardial fluid, thickening or pericardial calcification. There is atherosclerosis of the thoracic aorta, the great vessels of the mediastinum and the coronary arteries, including calcified atherosclerotic plaque in the left main, left anterior descending and left circumflex coronary arteries. No pathologically enlarged mediastinal or hilar lymph nodes. Small hiatal hernia. No axillary lymphadenopathy. Lungs/Pleura: 4 mm subpleural nodule in the anterior aspect of the right middle lobe (image 31 of series 205). A few other scattered 2-3 mm nodules are noted in the left upper lobe, but are highly nonspecific. Larger more suspicious appearing pulmonary nodules or masses are otherwise noted. There is a background of moderate centrilobular emphysema. No acute consolidative airspace disease. No pleural effusions. Bibasilar areas of subsegmental atelectasis and/or scarring are noted dependently. Musculoskeletal/Soft Tissues: There are no aggressive appearing lytic or blastic lesions noted in the visualized portions of the skeleton. CT ABDOMEN FINDINGS Hepatobiliary: Sub cm low-attenuation lesion segment 7 of the liver is too small  to definitively characterize, but is statistically likely a tiny cyst. No other suspicious cystic or solid hepatic lesions are noted. Dependent high attenuation material within the gallbladder is most compatible with biliary sludge. No current findings to suggest an acute cholecystitis at this time. Pancreas: No pancreatic mass. No pancreatic ductal dilatation. No pancreatic or peripancreatic fluid or inflammatory changes. Spleen: Unremarkable. Adrenals/Urinary Tract: Numerous well-defined low-attenuation lesions in the kidneys bilaterally, compatible with simple cysts, largest of which measures up to 7.9 cm in the lower pole of the left kidney. Moderate to severe bilateral hydroureteronephrosis in the visualized portions of the abdomen. Bilateral adrenal glands are normal in appearance. Stomach/Bowel: Normal appearance of the stomach. No pathologic dilatation of visualized portions of small bowel or colon. A few scattered colonic diverticulae are noted, without surrounding inflammatory changes to suggest an acute diverticulitis in the visualized portions of the abdomen at this time. Vascular/Lymphatic: Extensive atherosclerosis throughout the abdominal vasculature, without evidence of aneurysm or dissection in the visualized abdomen. Numerous borderline enlarged and mildly enlarged retroperitoneal lymph nodes measuring up to 12 mm in short axis are noted in the left para-aortic nodal station. Other: No significant volume of ascites in the visualized peritoneal cavity. No pneumoperitoneum. Musculoskeletal: There are no aggressive appearing lytic or blastic lesions noted in the visualized portions of the skeleton. Compression fracture of superior endplate of L2 with approximately 30% loss of central vertebral body height. IMPRESSION: 1. While there is some lymphadenopathy noted in the retroperitoneum, this is nonspecific. This could be reactive given the patient's uterine prolapse and associated bilateral  hydroureteronephrosis. However, given the patient's prominent bilateral adnexal structures, which could represent lymph nodes or ovarian tissue,  underlying malignancy is suspected. Further clinical correlation is suggested. 2. No other definite evidence of extra nodal metastatic disease in the abdomen. 3. There are several small nonspecific pulmonary nodules in the lungs bilaterally. While metastatic disease is not excluded, it is not strongly favored. The largest of these nodules is a 4 mm subpleural nodule in the anterior aspect of the right middle lobe (image 31 of series 205). Attention on any future followup studies is recommended to ensure stability. At the very least, a repeat noncontrast chest CT should be performed in 12 months. 4. Atherosclerosis, including left main and 2 vessel coronary artery disease. Assessment for potential risk factor modification, dietary therapy or pharmacologic therapy may be warranted, if clinically indicated. 5. Biliary sludge lying dependently in the gallbladder. No current findings to suggest an acute cholecystitis at this time. 6. Additional incidental findings, as above. Electronically Signed   By: Vinnie Langton M.D.   On: 08/03/2015 18:25   Ct Pelvis W Contrast  08/02/2015  CLINICAL DATA:  Vaginal bleeding.  Hypercalcemia.  Uterine prolapse. EXAM: CT PELVIS WITH CONTRAST TECHNIQUE: Multidetector CT imaging of the pelvis was performed using the standard protocol following the bolus administration of intravenous contrast. CONTRAST:  127mL OMNIPAQUE IOHEXOL 300 MG/ML  SOLN COMPARISON:  None. FINDINGS: I think there is complete prolapse of the vagina and uterus. The uterus is small, as expected at this age. Within the prolapse, I think that the posterior portion of the bladder also comes along with it. Anterior portion of the bladder retains a more normal position. The ureters are dilated to the point where they enter the prolapse on their way to the posterior portion of  the bladder. There is undoubtedly extrinsic compression of the ureters at this point. Complicating this case, there are bilateral necrotic nodal masses along both external iliac chains, measuring approximately 4-5 cm in size. This is a very worrisome finding and suggests metastatic malignancy. At the upper portion of this pelvic scan, I think there is also a necrotic node measuring 15 mm in diameter to the left of the aorta on image 3. There is advanced atherosclerosis of the aorta and the iliac and femoral vessels. There is ordinary degenerative change of the lower lumbar spine. There is an old compression deformity of L4. IMPRESSION: Complete prolapse of the uterus which also brings with it the posterior portion of the bladder. The central portion of the bladder is collapsed and the anterior portion of the bladder retains a more normal position. Both ureters are dilated due to extrinsic compression as they enter the prolapse on their way to the ureterovesical junctions in the portion of the bladder that is prolapsed. Bilateral external iliac nodal masses with necrosis, 4-5 cm in diameter. This is very worrisome for metastatic malignancy. There is an additional necrotic node in the abdominal retroperitoneum to the left of the aorta on image 3. Electronically Signed   By: Nelson Chimes M.D.   On: 08/02/2015 17:03   Ct Abdomen W Contrast  08/03/2015  CLINICAL DATA:  69 year old female with clinical concern for malignancy. Vaginal bleeding. Hypercalcemia. History of uterine prolapse. EXAM: CT CHEST AND ABDOMEN WITH CONTRAST TECHNIQUE: Multidetector CT imaging of the chest and abdomen was performed following the standard protocol during bolus administration of intravenous contrast. CONTRAST:  182mL OMNIPAQUE IOHEXOL 300 MG/ML  SOLN COMPARISON:  Pelvic CT 08/02/2015. FINDINGS: CT CHEST FINDINGS Mediastinum/Lymph Nodes: Heart size is normal. There is no significant pericardial fluid, thickening or pericardial  calcification. There is  atherosclerosis of the thoracic aorta, the great vessels of the mediastinum and the coronary arteries, including calcified atherosclerotic plaque in the left main, left anterior descending and left circumflex coronary arteries. No pathologically enlarged mediastinal or hilar lymph nodes. Small hiatal hernia. No axillary lymphadenopathy. Lungs/Pleura: 4 mm subpleural nodule in the anterior aspect of the right middle lobe (image 31 of series 205). A few other scattered 2-3 mm nodules are noted in the left upper lobe, but are highly nonspecific. Larger more suspicious appearing pulmonary nodules or masses are otherwise noted. There is a background of moderate centrilobular emphysema. No acute consolidative airspace disease. No pleural effusions. Bibasilar areas of subsegmental atelectasis and/or scarring are noted dependently. Musculoskeletal/Soft Tissues: There are no aggressive appearing lytic or blastic lesions noted in the visualized portions of the skeleton. CT ABDOMEN FINDINGS Hepatobiliary: Sub cm low-attenuation lesion segment 7 of the liver is too small to definitively characterize, but is statistically likely a tiny cyst. No other suspicious cystic or solid hepatic lesions are noted. Dependent high attenuation material within the gallbladder is most compatible with biliary sludge. No current findings to suggest an acute cholecystitis at this time. Pancreas: No pancreatic mass. No pancreatic ductal dilatation. No pancreatic or peripancreatic fluid or inflammatory changes. Spleen: Unremarkable. Adrenals/Urinary Tract: Numerous well-defined low-attenuation lesions in the kidneys bilaterally, compatible with simple cysts, largest of which measures up to 7.9 cm in the lower pole of the left kidney. Moderate to severe bilateral hydroureteronephrosis in the visualized portions of the abdomen. Bilateral adrenal glands are normal in appearance. Stomach/Bowel: Normal appearance of the stomach.  No pathologic dilatation of visualized portions of small bowel or colon. A few scattered colonic diverticulae are noted, without surrounding inflammatory changes to suggest an acute diverticulitis in the visualized portions of the abdomen at this time. Vascular/Lymphatic: Extensive atherosclerosis throughout the abdominal vasculature, without evidence of aneurysm or dissection in the visualized abdomen. Numerous borderline enlarged and mildly enlarged retroperitoneal lymph nodes measuring up to 12 mm in short axis are noted in the left para-aortic nodal station. Other: No significant volume of ascites in the visualized peritoneal cavity. No pneumoperitoneum. Musculoskeletal: There are no aggressive appearing lytic or blastic lesions noted in the visualized portions of the skeleton. Compression fracture of superior endplate of L2 with approximately 30% loss of central vertebral body height. IMPRESSION: 1. While there is some lymphadenopathy noted in the retroperitoneum, this is nonspecific. This could be reactive given the patient's uterine prolapse and associated bilateral hydroureteronephrosis. However, given the patient's prominent bilateral adnexal structures, which could represent lymph nodes or ovarian tissue, underlying malignancy is suspected. Further clinical correlation is suggested. 2. No other definite evidence of extra nodal metastatic disease in the abdomen. 3. There are several small nonspecific pulmonary nodules in the lungs bilaterally. While metastatic disease is not excluded, it is not strongly favored. The largest of these nodules is a 4 mm subpleural nodule in the anterior aspect of the right middle lobe (image 31 of series 205). Attention on any future followup studies is recommended to ensure stability. At the very least, a repeat noncontrast chest CT should be performed in 12 months. 4. Atherosclerosis, including left main and 2 vessel coronary artery disease. Assessment for potential risk  factor modification, dietary therapy or pharmacologic therapy may be warranted, if clinically indicated. 5. Biliary sludge lying dependently in the gallbladder. No current findings to suggest an acute cholecystitis at this time. 6. Additional incidental findings, as above. Electronically Signed   By: Mauri Brooklyn.D.  On: 08/03/2015 18:25   Mr Brain Wo Contrast  08/18/2015  CLINICAL DATA:  Recent hospitalization for encephalopathy due to hypercalcemia. History of squamous cell malignancy of unknown origin. Confusion, speech difficulty. RIGHT-sided facial droop. Initial encounter. EXAM: MRI HEAD WITHOUT CONTRAST TECHNIQUE: Multiplanar, multiecho pulse sequences of the brain and surrounding structures were obtained without intravenous contrast. COMPARISON:  CT head 08/17/2015.  CT head 07/31/2015. FINDINGS: The patient was unable to remain motionless for the exam. Small or subtle lesions could be overlooked. No evidence for acute infarction, hemorrhage, mass lesion, hydrocephalus, or extra-axial fluid. Moderate cerebral and cerebellar atrophy. Extensive T2 and FLAIR hyperintensity throughout the periventricular and subcortical white matter, representing small vessel disease. Numerous microbleeds throughout the brain including cerebellum, brainstem, and cerebral hemispheres, likely sequelae of hypertensive cerebrovascular disease. Areas of chronic hemorrhagic lacunar infarction are seen in the LEFT thalamus and LEFT lentiform nucleus. Flow voids are maintained in the carotid, basilar, and both vertebral arteries. Other than callosal atrophy, no midline abnormality. Extracranial soft tissues show no acute findings. Within limits for detection on a noncontrast exam, no intracranial metastatic disease. IMPRESSION: Chronic changes as described.  No acute stroke is evident. 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 Electronically Signed    By: Staci Righter M.D.   On: 08/18/2015 12:25   Ct Biopsy  08/05/2015  CLINICAL DATA:  Necrotic lymphadenopathy of the iliac lymph node chains bilaterally. EXAM: CT GUIDED CORE BIOPSY OF RIGHT ILIAC LYMPH NODE ANESTHESIA/SEDATION: 2.0  Mg IV Versed; 50 mcg IV Fentanyl Total Moderate Sedation Time: 12 minutes. PROCEDURE: The procedure risks, benefits, and alternatives were explained to the patient. Questions regarding the procedure were encouraged and answered. The patient understands and consents to the procedure. A time-out was performed prior to the procedure. The right lower abdominal wall was prepped with Betadine in a sterile fashion, and a sterile drape was applied covering the operative field. A sterile gown and sterile gloves were used for the procedure. Local anesthesia was provided with 1% Lidocaine. CT was performed in a supine position. Under CT guidance, a 17 gauge needle was advanced from an anterior approach into the right pelvis. After confirming needle tip position, a total of 4 separate coaxial 18 gauge core biopsy samples were obtained and submitted in saline. The outer needle was removed and additional CT performed. COMPLICATIONS: None FINDINGS: Core biopsy was performed at the level of enlarged necrotic lymph nodes in the right iliac chain. Anterior approach was performed through the level of the right iliacus muscle. Solid tissue and necrotic appearing tissue was obtained. IMPRESSION: CT-guided core biopsy performed of necrotic lymph node mass in the right iliac chain. Electronically Signed   By: Aletta Edouard M.D.   On: 08/05/2015 17:08   Dg Knee Complete 4 Views Right  07/31/2015  CLINICAL DATA:  Acute right knee pain after falling this morning. Initial encounter. EXAM: RIGHT KNEE - COMPLETE 4+ VIEW COMPARISON:  None. FINDINGS: There is no evidence of fracture, dislocation, or joint effusion. There is no evidence of arthropathy or other focal bone abnormality. Soft tissues are  unremarkable. IMPRESSION: Normal right knee. Electronically Signed   By: Marijo Conception, M.D.   On: 07/31/2015 16:01   Admission HPI:  Ms. 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 She was joined by her daughters relayed the history. The evening of 10/22, they noticed Ms. Tagliaferro seemed a little confused, tired. The morning of 10/23, her daughter [name] was awakened by Ms. Lonon carrying on a conversation with a family member who was not there. She also appeared more drowsy. Over time, her language became incomprehensible. They also noticed a right-sided facial droop, which has since improved, with no obvious one-sided weakness, seizure activity, or blackouts. In the ED, she was noted to be hypercalcemic to >15, was started on IV fluids, and was treated with calcitonin. Her family denies that she's had any fever, chills, chest pain, shortness of breath, headache, abdominal pain, dysuria, back pain, polydipsia, bowel or bladder incontinence, nausea, vomiting, diarrhea, falls since her last admission, or skin changes. She was previously able to perform all her ADLs at home. She is not currently on thiazide diuretics. 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 She has never had a colonoscopy. Her mother also recently passed from [place].   She was previously admitted on 10/6 for a fall in the setting of altered mental status with hypercalcemia to 14.5. PTH, PTHrp, TSH , VIT D 1,25 within normal limits. She was treated with calcitonin and IV bisphosphonates. She was scheduled for a pamidronate infusion on 10/31.   On the 10/6 admission, Pelvic CT scan revealed bilateral necrotic external iliac lymph nodes. Interventional radiology performed a biopsy  which revealed squamous cell carcinoma. The primary tumor was not located during this hospital stay, however is likely ano-genital or cervical. Pap-smear was unremarkable, however patient has a markedly prolapsed uterus with extensive excoriations, which could be location of primary tumor. Visual inspection of genitals did not reveal a tumor. Patient will need anoscopy, and was scheduled to follow-up with rad-onc on 10/24 and oncology on 10/27.  Blood cultures on the 10/14 admission, also demonstrated Proteus mirabilis bacteremia without a known source. She had been treated with 1 dose ceftriaxone, 2 days of zosyn, and transitioned to Bactrim PO for a 14 day course. However, she had cut the course short due to difficulty swallowing the pills. Blood cultures from 10/13 showed no growth, and UA was unremarkable.  Hospital Course by problem list:   1. Hypercalcemia of malignancy: Ms. Colasurdo originally presented with somnolence and was found to be hypercalcemic with a calcium >15 in the setting of recently diagnosed metastatic squamous cell carcinoma. She was heavily hydrated, given 2 doses of calcitonin, Pamidronate, and her calcium came down to 10.5 and her mental status improved significantly. She was discharged with intranasal calcitonin, instructed to stay as hydrated as possible, and has an appointment for another Pamidronate infusion on 10/31. Pamidronate can be given NO SOONER than 7 days apart.  2. Leukocytosis and lactic acidosis: She presented with no obvious signs of infection; urinalysis and chest x-ray were unremarkable, but she had a leukocytosis of 19 and lactic acidosis that improved with fluids.. Because she has a known uterine prolapse and had Proteus sepsis last admission, she was empirically started on Zosyn. Her blood cultures remained negative for 4 days and her lactic acidosis resolved so Zosyn was discontinued and her leukocytosis normalized.  3. Right-sided facial droop: There was  concern for stroke but brain MRI showed only chronic microvascular changes without acute stroke.  4. 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 Oncology follows her closely and she'll meet with Dr. Burr Medico for  a PET/CT in the next week. There was question whether she has vaginal cancer caused by irritation from her prolapsed uterus. She has had a poor appetite and seems to be depressed, so she was started on mirtazapine 15mg  daily in the evening per Dr. Ernestina Penna recommendation.  Discharge Vitals:   BP 149/82 mmHg  Pulse 84  Temp(Src) 97.5 F (36.4 C) (Oral)  Resp 18  Ht 5\' 1"  (1.549 m)  Wt 57 kg (125 lb 10.6 oz)  BMI 23.76 kg/m2  SpO2 91%  Discharge Labs:  Results for orders placed or performed during the hospital encounter of 08/17/15 (from the past 24 hour(s))  CBC     Status: Abnormal   Collection Time: 08/21/15  5:30 AM  Result Value Ref Range   WBC 13.3 (H) 4.0 - 10.5 K/uL   RBC 3.15 (L) 3.87 - 5.11 MIL/uL   Hemoglobin 9.3 (L) 12.0 - 15.0 g/dL   HCT 28.7 (L) 36.0 - 46.0 %   MCV 91.1 78.0 - 100.0 fL   MCH 29.5 26.0 - 34.0 pg   MCHC 32.4 30.0 - 36.0 g/dL   RDW 17.3 (H) 11.5 - 15.5 %   Platelets 342 150 - 400 K/uL  Basic metabolic panel     Status: Abnormal   Collection Time: 08/21/15  5:30 AM  Result Value Ref Range   Sodium 135 135 - 145 mmol/L   Potassium 3.0 (L) 3.5 - 5.1 mmol/L   Chloride 107 101 - 111 mmol/L   CO2 21 (L) 22 - 32 mmol/L   Glucose, Bld 144 (H) 65 - 99 mg/dL   BUN 15 6 - 20 mg/dL   Creatinine, Ser 1.25 (H) 0.44 - 1.00 mg/dL   Calcium 10.3 8.9 - 10.3 mg/dL   GFR calc non Af Amer 42 (L) >60 mL/min   GFR calc Af Amer 49 (L) >60 mL/min   Anion gap 7 5 - 15    Signed: [name], MD 08/21/2015, 1:50 PM

## 2015-08-18 NOTE — Progress Notes (Signed)
Report received from

## 2015-08-18 NOTE — Progress Notes (Signed)
CRITICAL VALUE ALERT  Critical value received:  Lactic acid 2.4 Date of notification: 08/18/15  Time of notification: 1100  Critical value read back:  yes Nurse who received alert: Venetia Constable  MD notified (1st page):  Dr. Melburn Hake Time of first page: 60 MD notified (2nd page):  Time of second page:  Responding MD:  Dr. Melburn Hake Time MD responded:  1150

## 2015-08-19 ENCOUNTER — Encounter (HOSPITAL_COMMUNITY): Payer: Self-pay | Admitting: Student

## 2015-08-19 ENCOUNTER — Telehealth: Payer: Self-pay | Admitting: *Deleted

## 2015-08-19 DIAGNOSIS — C799 Secondary malignant neoplasm of unspecified site: Secondary | ICD-10-CM

## 2015-08-19 LAB — URINE CULTURE: CULTURE: NO GROWTH

## 2015-08-19 LAB — COMPREHENSIVE METABOLIC PANEL
ALT: 21 U/L (ref 14–54)
ANION GAP: 13 (ref 5–15)
AST: 40 U/L (ref 15–41)
Albumin: 2.6 g/dL — ABNORMAL LOW (ref 3.5–5.0)
Alkaline Phosphatase: 76 U/L (ref 38–126)
BILIRUBIN TOTAL: 1.1 mg/dL (ref 0.3–1.2)
BUN: 22 mg/dL — ABNORMAL HIGH (ref 6–20)
CHLORIDE: 114 mmol/L — AB (ref 101–111)
CO2: 19 mmol/L — ABNORMAL LOW (ref 22–32)
Calcium: 11.6 mg/dL — ABNORMAL HIGH (ref 8.9–10.3)
Creatinine, Ser: 1.66 mg/dL — ABNORMAL HIGH (ref 0.44–1.00)
GFR, EST AFRICAN AMERICAN: 35 mL/min — AB (ref >60)
GFR, EST NON AFRICAN AMERICAN: 30 mL/min — AB (ref >60)
Glucose, Bld: 54 mg/dL — ABNORMAL LOW (ref 65–99)
POTASSIUM: 3.6 mmol/L (ref 3.5–5.1)
Sodium: 146 mmol/L — ABNORMAL HIGH (ref 135–145)
TOTAL PROTEIN: 7.9 g/dL (ref 6.5–8.1)

## 2015-08-19 LAB — CBC
HEMATOCRIT: 33.6 % — AB (ref 36.0–46.0)
HEMOGLOBIN: 10.4 g/dL — AB (ref 12.0–15.0)
MCH: 28.7 pg (ref 26.0–34.0)
MCHC: 31 g/dL (ref 30.0–36.0)
MCV: 92.8 fL (ref 78.0–100.0)
Platelets: 488 10*3/uL — ABNORMAL HIGH (ref 150–400)
RBC: 3.62 MIL/uL — AB (ref 3.87–5.11)
RDW: 17.1 % — ABNORMAL HIGH (ref 11.5–15.5)
WBC: 20.2 10*3/uL — AB (ref 4.0–10.5)

## 2015-08-19 LAB — URINALYSIS, ROUTINE W REFLEX MICROSCOPIC
BILIRUBIN URINE: NEGATIVE
Glucose, UA: NEGATIVE mg/dL
Ketones, ur: NEGATIVE mg/dL
LEUKOCYTES UA: NEGATIVE
NITRITE: NEGATIVE
PH: 5 (ref 5.0–8.0)
Protein, ur: NEGATIVE mg/dL
SPECIFIC GRAVITY, URINE: 1.012 (ref 1.005–1.030)
UROBILINOGEN UA: 0.2 mg/dL (ref 0.0–1.0)

## 2015-08-19 LAB — URINE MICROSCOPIC-ADD ON

## 2015-08-19 LAB — SODIUM, URINE, RANDOM: Sodium, Ur: 131 mmol/L

## 2015-08-19 LAB — CREATININE, URINE, RANDOM: Creatinine, Urine: 32.22 mg/dL

## 2015-08-19 MED ORDER — SODIUM CHLORIDE 0.9 % IV SOLN: INTRAVENOUS | Status: DC

## 2015-08-19 MED ORDER — DEXTROSE-NACL 5-0.45 % IV SOLN
INTRAVENOUS | Status: DC
  Administered 2015-08-19 – 2015-08-21 (×3): via INTRAVENOUS

## 2015-08-19 MED ORDER — CARVEDILOL 3.125 MG PO TABS
3.1250 mg | ORAL_TABLET | Freq: Two times a day (BID) | ORAL | Status: DC
  Administered 2015-08-19 – 2015-08-20 (×4): 3.125 mg via ORAL
  Filled 2015-08-19 (×5): qty 1

## 2015-08-19 MED ORDER — ENSURE ENLIVE PO LIQD
237.0000 mL | Freq: Two times a day (BID) | ORAL | Status: DC
  Administered 2015-08-19 – 2015-08-21 (×2): 237 mL via ORAL

## 2015-08-19 NOTE — Care Management Important Message (Signed)
Important Message  Patient Details  Name: Allison Chapman MRN: [id number] Date of Birth: 14-Feb-1946   Medicare Important Message Given:  Yes-second notification given    Nathen May 08/19/2015, 1:58 PM

## 2015-08-19 NOTE — Progress Notes (Signed)
Subjective: Allison Chapman is a 69 y.o. female with PMH of HTN, stage IV uterine prolopse and recent lymph-node biopsy diagnosed SCC of unknown primary, on hospital day 2 for AMS secondary to malignant hypercalcemia, currently receiving treatment with IV fluids, calcitonin and IV bisphosphonate.  History today is limited due to patient's lmited verbal response to questioning. Her two daughters are present at bedside and state that Ms. Bascom seems "better" as compared to her admission, though she generally seems less alert/conversational than her baseline during previous admission.  Yesterday (10/24) pt had follow-up MRI for several days of right-sided facial droop and right-sided weakness that showed evidence of chronic cerebral vascular disease but no acute changes. Pt seen by heme/onc yesterday who recommend sigmoidoscopy in hospital setting with plan for further work-up (PET scan, etc.) as an outpatient.  Family is awaiting information regarding plans for cancer work-up.  Objective: Vital signs in last 24 hours: Filed Vitals:   08/19/15 0000 08/19/15 0038 08/19/15 0457 08/19/15 0841  BP: 134/91 150/88 142/100 143/88  Pulse:  108 104 97  Temp: 98.3 F (36.8 C) 98.3 F (36.8 C) 98.2 F (36.8 C) 98.2 F (36.8 C)  TempSrc: Axillary Oral Oral Axillary  Resp: 21 18 18 18   Height:      Weight:      SpO2:  96% 98% 100%   Weight change:   Intake/Output Summary (Last 24 hours) at 08/19/15 1134 Last data filed at 08/19/15 0455  Gross per 24 hour  Intake   1760 ml  Output   3100 ml  Net  -1340 ml   Physical Exam  Constitutional: No distress.  Cardiovascular: Regular rhythm and normal heart sounds.   No murmur heard. Pulmonary/Chest: Effort normal and breath sounds normal. No respiratory distress.  Abdominal: There is no tenderness.  Genitourinary:  Stage IV uterine prolapse   Musculoskeletal: She exhibits no edema or tenderness.  Psychiatric:  Unable to assess as patient had  minimal verbal responsiveness during the exam and is altered. Alertness not formally assessed.  Patient rouses to voice but does not follow directions.    Lab Results: -No growth on blood culture x2 days as of 10/25 -Urine culture negative -MRSA PCR negative -Creatinine increased 1.47-->1.66 (10/25) -Calcium 10/25 11.6, down from 13.1 (corrected 12.7 down from 14.4)  -WBC 20.2 (up from 19.2) -Lactic acid 10/24 2.4(@1231 )-->3.3(@1839 )-->1.8 (@2215 ) -Glucose 54 (low) @5 :30AM 10/25   Chemistries CMP Latest Ref Rng 08/19/2015 08/18/2015 08/18/2015  Glucose 65 - 99 mg/dL 54(L) 82 88  BUN 6 - 20 mg/dL 22(H) 24(H) 28(H)  Creatinine 0.44 - 1.00 mg/dL 1.66(H) 1.47(H) 1.64(H)  Sodium 135 - 145 mmol/L 146(H) 147(H) 142  Potassium 3.5 - 5.1 mmol/L 3.6 4.0 4.2  Chloride 101 - 111 mmol/L 114(H) 120(H) 112(H)  CO2 22 - 32 mmol/L 19(L) 20(L) 20(L)  Calcium 8.9 - 10.3 mg/dL 11.6(H) 13.1(HH) >15.0(HH)  Total Protein 6.5 - 8.1 g/dL 7.9 7.3 8.1  Total Bilirubin 0.3 - 1.2 mg/dL 1.1 0.7 0.8  Alkaline Phos 38 - 126 U/L 76 65 75  AST 15 - 41 U/L 40 30 33  ALT 14 - 54 U/L 21 17 17    CBC CBC Latest Ref Rng 08/19/2015 08/18/2015 08/18/2015  WBC 4.0 - 10.5 K/uL 20.2(H) 19.2(H) 19.4(H)  Hemoglobin 12.0 - 15.0 g/dL 10.4(L) 10.0(L) 11.6(L)  Hematocrit 36.0 - 46.0 % 33.6(L) 30.8(L) 35.5(L)  Platelets 150 - 400 K/uL 488(H) 506(H) 484(H)    Studies/Results:  CT Head 10/23  FINDINGS: There is no  evidence of acute cortical infarct, intracranial hemorrhage, mass, midline shift, or extra-axial fluid collection. Ventricles and sulci are within normal limits for age. Small, chronic infarcts are again seen in the deep gray nuclei and deep cerebral white matter bilaterally. Confluent hypodensities in the subcortical and deep cerebral white matter bilaterally are unchanged. The visualized portions of the orbits are unremarkable. No significant inflammatory change in the visualized paranasal sinuses. The no destructive  skull lesions. Calcified atherosclerosis at the skullbase.   IMPRESSION: 1. No evidence of acute intracranial abnormality. 2. 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 3. Unchanged cerebral white matter disease, nonspecific but may reflect a advanced chronic small vessel change. Electronically Signed   By: Logan Bores M.D.   On: 08/17/2015 20:04   2 View Chest 10/23  FINDINGS: Heart size and pulmonary vascularity are normal. Minimal atelectasis at the lung bases due to a shallow inspiration. The lungs are otherwise clear. No osseous abnormality.   IMPRESSION: No active cardiopulmonary disease. Electronically Signed   By: Lorriane Shire M.D.   On: 08/17/2015 19:54   MRI 10/24  FINDINGS: The patient was unable to remain motionless for the exam. Small or subtle lesions could be overlooked. No evidence for acute infarction, hemorrhage, mass lesion, hydrocephalus, or extra-axial fluid. Moderate cerebral and cerebellar atrophy. Extensive T2 and FLAIR hyperintensity throughout the periventricular and subcortical white matter, representing small vessel disease. Numerous microbleeds throughout the brain including cerebellum, brainstem, and cerebral hemispheres, likely sequelae of hypertensive cerebrovascular disease. Areas of chronic hemorrhagic lacunar infarction are seen in the LEFT thalamus and LEFT lentiform nucleus. Flow voids are maintained in the carotid, basilar, and both vertebral arteries. Other than callosal atrophy, no midline abnormality. Extracranial soft tissues show no acute findings. Within limits for detection on a noncontrast exam, no intracranial metastatic disease.   IMPRESSION: Chronic changes as described.  No acute stroke is evident. 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 Electronically Signed   By: Staci Righter M.D.   On: 08/18/2015 12:25   Medications: -Calcitonin 254  Units BID injection for 48 hrs (started 10/23, 4 doses total, last dose 10/25) -Pamidronate 60mg  in 528mL NS IV (once,10/24) -Magnesium sulfate 2g IV (once, 10/24) -Carvedilol 3.125mg  BID oral, with meals (started 10/25) -Heparin 5,000 units subcutaneous TID (started 10/23) -Zosyn 3.375g IV q8hr (started 10/24) -NS 39ml IV q12hr (started 10/23) -NS 181ml/hr continuous IV (started 10/23, D/C'd 10/25) -D5NS 118ml/hr continuous started 10/25  Assessment/Plan: Active Problems:   Hypercalcemia   Metastatic squamous cell carcinoma (HCC)  JALECIA LEON is a 69 y/o female with PMH of HTN, stage IV uterine prolopse and recent diagnosis of metastatic SCC with unknown primary source, on hospital day 2 for management of hypercalcemia of malignancy.  Pt showing improvement after IV fluids, 48 hrs of calcitonin and one time IV dose of 60mg  pamidronate.  Distribution of nodal involvement is suggestive of gyn or rectal primary source. Goal of this hospitalization is to stabilize pt for further outpatient workup of unknown primary  #Hypercalcemia of malignancy --> workup of unknown primary  -Continue trending calcium (>15 on admission, down to 13.1 10/24, down to 11.6 10/25) -GI consult obtained; sigmoidoscopy to be performed tomorrow afternoon.  GI physician will write diet orders for prep -Gyn/Onc to see pt in outpatient setting to eval for cervical biopsy, appt scheduled for Tuesday 08/26/15 -Pending results, appreciate further recommendations from heme/onc (need for outpatient PET scan, etc)  #  Septicemia prophylaxis -Pt started on Zosyn 10/24, given asymptomatic proteus septicemia during admission few weeks ago.  Stage IV prolapsed uterus is suspected source.  Discontinue if blood culture negative x3 days -WBC 20.2, unclear cause, no known diarrhea per nursing.  Low threshold to evaluate for C. Diff if not improving  #Increased creatinine  -1.47 (10/24)-->1.66 (10/25), however this seems to be within  the patient's variable baseline per record review -Obtain UA to evaluate for RBCs, pt at risk for nephrolithiasis.  Consider renal CT if suspicion for stone.  #Hypoglycemia  -NS changed to D5NS 119ml/hr continous  #Right-sided neuro deficits -MRI 10/24 shows no acute process   This is a Careers information officer Note.  The care of the patient was discussed with Dr. Melburn Hake and the assessment and plan formulated with their assistance.  Please see their attached note for official documentation of the daily encounter.   LOS: 2 days   Ladell Pier, Med Student 08/19/2015, 11:34 AM

## 2015-08-19 NOTE — Progress Notes (Signed)
Patient ID: Allison Chapman, female   DOB: 1946/02/05, 69 y.o.   MRN: [id number]   Subjective: Allison Chapman was again somnolent and minimally responsive today, but without any complaints of pain.  Her daughters felt she was much more alert today compared to Sunday, and were agreeable to the plan of getting a sigmoidoscopy tomorrow, and getting her home to see her outpatient doctors once we bring her calcium level down and ensure she doesn't have a blood infection.  Objective: Vital signs in last 24 hours: Filed Vitals:   08/19/15 0000 08/19/15 0038 08/19/15 0457 08/19/15 0841  BP: 134/91 150/88 142/100 143/88  Pulse:  108 104 97  Temp: 98.3 F (36.8 C) 98.3 F (36.8 C) 98.2 F (36.8 C) 98.2 F (36.8 C)  TempSrc: Axillary Oral Oral Axillary  Resp: 21 18 18 18   Height:      Weight:      SpO2:  96% 98% 100%   General: Frail African-American lady resting in bed HEENT: no scleral icterus. EOMI. Subtle right-sided facial droop improved. Cardiac: Tachycardic but regular, no rubs, murmurs or gallops Pulm: Breathing well, clear to auscultation bilaterally Abd: soft, nontender, nondistended, BS present, uterine prolapse unchanged from prior, not bleeding. Ext: warm and well perfused, no pedal edema Lymph: no cervical LAD Skin: no rash Neuro: AOx0, cranial nerves II-XII grossly intact  Lab Results: Basic Metabolic Panel:  Recent Labs Lab 08/17/15 2128 08/18/15 0029 08/18/15 0626 08/19/15 0530  NA  --  142 147* 146*  K  --  4.2 4.0 3.6  CL  --  112* 120* 114*  CO2  --  20* 20* 19*  GLUCOSE  --  88 82 54*  BUN  --  28* 24* 22*  CREATININE  --  1.64* 1.47* 1.66*  CALCIUM  --  >15.0* 13.1* 11.6*  MG 1.8 1.6* 2.3  --   PHOS 3.9  --   --   --    Liver Function Tests:  Recent Labs Lab 08/18/15 0626 08/19/15 0530  AST 30 40  ALT 17 21  ALKPHOS 65 76  BILITOT 0.7 1.1  PROT 7.3 7.9  ALBUMIN 2.4* 2.6*   CBC:  Recent Labs Lab 08/18/15 0029 08/18/15 0626 08/19/15 0822    WBC 19.4* 19.2* 20.2*  NEUTROABS 17.4* 17.3*  --   HGB 11.6* 10.0* 10.4*  HCT 35.5* 30.8* 33.6*  MCV 90.3 90.9 92.8  PLT 484* 506* 488*   Urinalysis:  Recent Labs Lab 08/18/15 0209  COLORURINE YELLOW  LABSPEC 1.008  PHURINE 5.0  GLUCOSEU NEGATIVE  HGBUR TRACE*  BILIRUBINUR NEGATIVE  KETONESUR NEGATIVE  PROTEINUR NEGATIVE  UROBILINOGEN 0.2  NITRITE NEGATIVE  LEUKOCYTESUR NEGATIVE    Micro Results: Recent Results (from the past 240 hour(s))  Blood culture (routine x 2)     Status: None (Preliminary result)   Collection Time: 08/17/15  8:07 PM  Result Value Ref Range Status   Specimen Description BLOOD RIGHT HAND  Final   Special Requests IN PEDIATRIC BOTTLE 1CC  Final   Culture NO GROWTH < 24 HOURS  Final   Report Status PENDING  Incomplete  Blood culture (routine x 2)     Status: None (Preliminary result)   Collection Time: 08/17/15  8:20 PM  Result Value Ref Range Status   Specimen Description BLOOD LEFT HAND  Final   Special Requests BOTTLES DRAWN AEROBIC ONLY 5CC  Final   Culture NO GROWTH < 24 HOURS  Final   Report Status PENDING  Incomplete  MRSA PCR Screening     Status: None   Collection Time: 08/17/15 10:49 PM  Result Value Ref Range Status   MRSA by PCR NEGATIVE NEGATIVE Final    Comment:        The GeneXpert MRSA Assay (FDA approved for NASAL specimens only), is one component of a comprehensive MRSA colonization surveillance program. It is not intended to diagnose MRSA infection nor to guide or monitor treatment for MRSA infections.   Urine culture     Status: None   Collection Time: 08/18/15 10:00 AM  Result Value Ref Range Status   Specimen Description URINE, CATHETERIZED  Final   Special Requests NONE  Final   Culture NO GROWTH 1 DAY  Final   Report Status 08/19/2015 FINAL  Final   Studies/Results:  Mr Brain Wo Contrast  08/18/2015  CLINICAL DATA:  Recent hospitalization for encephalopathy due to hypercalcemia. History of squamous cell  malignancy of unknown origin. Confusion, speech difficulty. RIGHT-sided facial droop. Initial encounter. EXAM: MRI HEAD WITHOUT CONTRAST TECHNIQUE: Multiplanar, multiecho pulse sequences of the brain and surrounding structures were obtained without intravenous contrast. COMPARISON:  CT head 08/17/2015.  CT head 07/31/2015. FINDINGS: The patient was unable to remain motionless for the exam. Small or subtle lesions could be overlooked. No evidence for acute infarction, hemorrhage, mass lesion, hydrocephalus, or extra-axial fluid. Moderate cerebral and cerebellar atrophy. Extensive T2 and FLAIR hyperintensity throughout the periventricular and subcortical white matter, representing small vessel disease. Numerous microbleeds throughout the brain including cerebellum, brainstem, and cerebral hemispheres, likely sequelae of hypertensive cerebrovascular disease. Areas of chronic hemorrhagic lacunar infarction are seen in the LEFT thalamus and LEFT lentiform nucleus. Flow voids are maintained in the carotid, basilar, and both vertebral arteries. Other than callosal atrophy, no midline abnormality. Extracranial soft tissues show no acute findings. Within limits for detection on a noncontrast exam, no intracranial metastatic disease. IMPRESSION: Chronic changes as described.  No acute stroke is evident. 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 Electronically Signed   By: Staci Righter M.D.   On: 08/18/2015 12:25   Medications: I have reviewed the patient's current medications. Scheduled Meds: . carvedilol  3.125 mg Oral BID WC  . heparin  5,000 Units Subcutaneous 3 times per day  . piperacillin-tazobactam (ZOSYN)  IV  3.375 g Intravenous Q8H  . sodium chloride  3 mL Intravenous Q12H   Continuous Infusions: . sodium chloride 125 mL/hr at 08/19/15 0832  . sodium chloride     PRN Meds:.  Assessment/Plan:  Hypercalcemia: This is the  most likely cause of her altered mental status, and is likely from her metastatic squamous cell carcinoma. We'll drop the rate of her fluids as she put out 3.1L yesterday. -Decrease fluid rate to NS 150cc/hr -Continue pamidronate 60 mg IV -Continue calcitonin 4U/kg today, will stop tomorrow once 48 hours is up -BMP tomorrow am  Leukocytosis and lactic acidosis without a clear source: Urinalysis was clean but she has a prolapsed uterus and had gram negative rod sepsis last admission. We'll continue Zosyn today and likely stop it tomorrow if her blood cultures continue to not grow anything. -Started Zosyn per pharmacy -Follow blood cultures -Trend lactic acid -CBC tomorrow  Creatinine rise: Her creatinine rose from 1.4 to 1.6 overnight despite getting a lot of fluids; this could be bilateral nephrolithiasis given her hypercalcemia and poor fluid intake, or perhaps acute interstitial nephritis as we just started Zosyn, so we'll check  a urinalysis for hemogloginuria and eosinophils, and consider getting a renal CT pending those results. -Follow urinalysis  Metastatic Squamous Cell Carcinoma, Unknown Primary, suspicious for anorectal SCC: She'll get a sigmoidoscopy tomorrow to evaluate for rectal cancer. Dr. Burr Medico is following along and she'll get further work-up once we get her hypercalcemia tuned up. -Thanks GI for your help -Thanks oncology for your help -Sigmoidoscopy tomorrow  Right-sided facial droop: Her brain MRI was negative for acute stroke, and this seems to have resolved.  Dispo: Disposition is deferred at this time, awaiting improvement of current medical problems.  The patient does have a current PCP (Carly Montey Hora, MD) and does need an [facility] hospital follow-up appointment after discharge.  The patient does have transportation limitations that hinder transportation to clinic appointments.  .Services Needed at time of discharge: Y = Yes, Blank = No PT:   OT:   RN:   Equipment:     Other:     LOS: 2 days   [name], MD 08/19/2015, 1:00 PM

## 2015-08-19 NOTE — Progress Notes (Signed)
Initial Nutrition Assessment  DOCUMENTATION CODES:   Not applicable  INTERVENTION:   -Ensure Enlive po BID, each supplement provides 350 kcal and 20 grams of protein  NUTRITION DIAGNOSIS:   Inadequate oral intake related to lethargy/confusion as evidenced by meal completion < 25%.  GOAL:   Patient will meet greater than or equal to 90% of their needs  MONITOR:   PO intake, Supplement acceptance, Diet advancement, Labs, Weight trends, Skin, I & O's  REASON FOR ASSESSMENT:   Malnutrition Screening Tool, Low Braden    ASSESSMENT:   Allison Chapman is a 69 year old woman with a past medical history of recently diagnosed uterine prolapse, squamous cell malignancy of unknown origin (possibly ano-genital), recent hospitalization for encephalopathy secondary to hypercalcemia who presents with altered mental status. She was joined by her daughters relayed the history. The evening of 10/22, they noticed Allison Chapman seemed a little confused, tired.  Pt admitted with hypercalcemia with AMS.   Attempted to speak with pt and family, however, pt in with multiple staff members at time of visit.   SLP evaluated today and pt was advanced to a dysphagia 1 diet with thin liquids.   Spoke with RN, who reports that pt has been very lethargic and, as a result, intake has been very poor. Noted 5% meal completion. RD will order Ensure supplements, which pt consumed during previous admission.   Wt hx reveals a 16.6% wt loss over the past 7 months.   Unable to complete Nutrition-Focused physical exam at this time.   RD suspects malnutrition, however, unable to identify at this time.   Oncology following for Metastatic Squamous Cel Carcinoma of unknown primary. Awaiting further input from GYN oncology. GI consult pending for colonoscopy or sigmoidoscopy to r/o rectal cancer.   Labs reviewed: Na: 146.   Diet Order:  DIET - DYS 1 Room service appropriate?: Yes; Fluid consistency:: Thin  Skin:   Reviewed, no issues  Last BM:  PTA  Height:   Ht Readings from Last 1 Encounters:  08/17/15 5\' 1"  (1.549 m)    Weight:   Wt Readings from Last 1 Encounters:  08/17/15 125 lb 10.6 oz (57 kg)    Ideal Body Weight:  47.7 kg  BMI:  Body mass index is 23.76 kg/(m^2).  Estimated Nutritional Needs:   Kcal:  1500-1700  Protein:  70-85 grams  Fluid:  1.5-1.7 L  EDUCATION NEEDS:   No education needs identified at this time  Jenifer A. Jimmye Norman, RD, LDN, CDE Pager: [phone] After hours Pager: [phone]

## 2015-08-19 NOTE — Clinical Documentation Improvement (Signed)
Internal Medicine  Can the diagnosis of systemic infection be further specified? Please document in the progress notes and discharge summary if Sepsis is present on admission if appropriate.   Sepsis - specify causative organism if known  Determine if there is Severe Sepsis (Sepsis with organ dysfunction - specify), Septic Shock if present  Identify etiology of Sepsis - Device, Implant, Graft, Infusion, Abortion  Specify organ dysfunction - Respiratory Failure, Encephalopathy, Acute Kidney Failure, Pneumonia, UTI, Other (specify), Unable to Clinically Determine}  Other  Clinically Undetermined  Document any associated diagnoses/conditions.   Supporting Information: 10/24 Pharmacy consult for R/O Sepsis, IV Zosyn dosing.  Lactic acid: 10/23:  3.5. 10/24:  3.3;  2.7;  2.4.   Please exercise your independent, professional judgment when responding. A specific answer is not anticipated or expected.   Thank You,  [place] [phone]

## 2015-08-19 NOTE — Progress Notes (Signed)
  Date: 08/19/2015  Patient name: MARIAN MENEELY  Medical record number: [id number]  Date of birth: 12-Aug-1946   I personally saw and evaluated Ms. Luckenbach.  Her plan of care was discussed with the house staff. Please see Dr. Melburn Hake' note for complete details. I concur with his findings.   Sid Falcon, MD 08/19/2015, 8:26 PM

## 2015-08-19 NOTE — Evaluation (Signed)
Clinical/Bedside Swallow Evaluation Patient Details  Name: Allison Chapman MRN: [id number] Date of Birth: 1946/01/19  Today's Date: 08/19/2015 Time: SLP Start Time (ACUTE ONLY): 1000 SLP Stop Time (ACUTE ONLY): 1015 SLP Time Calculation (min) (ACUTE ONLY): 15 min  Past Medical History:  Past Medical History  Diagnosis Date  . Hypertension   . Encephalopathy acute   . Prolapsed uterus     stage IV  . Vaginal bleeding   . Hx of lymph node biopsy 08/05/2015    right iliac=Metastatic squamous cell carcinoma   Past Surgical History:  Past Surgical History  Procedure Laterality Date  . Tubal ligation     HPI:  69 y.o. 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 MRI and CXR neagtive.    Assessment / Plan / Recommendation Clinical Impression  Pt presents with moderate oral dysphagia characterized by delayed oral transit, reduced awareness of bolus, piecemealing bites and sips, and oral residue with puree, likely due to AMS. Pt demonstrated intermittent focused attention and required moderate to total assist to feed and swallow. SLP provided verbal and tactile cues to pt and educated pt's daughter on the importance of aspiration precautions. SLP recommends the pt needs to be alert and sitting upright when eating, also alternate bites and sips to reduce oral residue. Although pt did not demonstrate overt s/s of aspiration, pt has a high risk of aspiration due to AMS and status as dependent feeder. SLP recommends dysphagia 1 (puree), thin liquids (straw okay), and pills crushed in puree. SLP will f/u with check for diet tolerance, po upgrades with improved mentation, and patient and family education.     Aspiration Risk  Moderate    Diet Recommendation Dysphagia 1 (Puree);Thin   Medication Administration: Crushed with  puree Compensations: Slow rate;Small sips/bites;Check for pocketing (alternate bites and sips)    Other  Recommendations Oral Care Recommendations: Oral care BID   Follow Up Recommendations       Frequency and Duration min 2x/week  2 weeks   Pertinent Vitals/Pain NA    SLP Swallow Goals     Swallow Study Prior Functional Status       General Other Pertinent Information: 69 y.o. 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 MRI and CXR neagtive.  Type of Study: Bedside swallow evaluation Diet Prior to this Study: NPO Temperature Spikes Noted: No Respiratory Status: Supplemental O2 delivered via (comment) History of Recent Intubation: No Behavior/Cognition: Alert;Cooperative;Doesn't follow directions;Requires cueing;Pleasant mood Oral Cavity - Dentition: Adequate natural dentition/normal for age Self-Feeding Abilities: Needs set up;Needs assist Patient Positioning: Upright in bed Baseline Vocal Quality: Not observed Volitional Cough: Cognitively unable to elicit Volitional Swallow: Able to elicit    Oral/Motor/Sensory Function Overall Oral Motor/Sensory Function: Appears within functional limits for tasks assessed Labial ROM: Within Functional Limits Labial Symmetry: Within Functional Limits Lingual ROM: Within Functional Limits Lingual Symmetry: Within Functional Limits Facial ROM: Within Functional Limits Facial Symmetry: Within Functional Limits   Ice Chips Ice chips: Not tested   Thin Liquid Thin Liquid: Impaired Presentation: Cup;Straw Oral Phase Impairments: Impaired anterior to posterior transit;Poor awareness of bolus Oral Phase Functional Implications: Oral holding Pharyngeal  Phase Impairments: Multiple swallows    Nectar Thick Nectar Thick Liquid: Not tested  Honey Thick Honey Thick Liquid: Not  tested   Puree Puree: Impaired Presentation: Spoon Oral Phase Impairments: Impaired anterior to posterior transit;Poor awareness of bolus Oral Phase Functional Implications: Oral residue;Oral holding;Prolonged oral transit Pharyngeal Phase Impairments: Multiple swallows   Solid   GO    Solid: Not tested      Lanier Ensign, Student-SLP  Lanier Ensign 08/19/2015,10:42 AM

## 2015-08-19 NOTE — Telephone Encounter (Signed)
Dr. Marvel Plan called and would like a call from Dr. Burr Medico for GI and GYN recommendations for this patient.  Dr. Marvel Plan can be reached at [phone].

## 2015-08-19 NOTE — Progress Notes (Signed)
PT Cancellation Note  Patient Details Name: Allison Chapman MRN: [id number] DOB: 1946-06-09   Cancelled Treatment:    Reason Eval/Treat Not Completed: Patient's level of consciousness Pt minimally responsive to verbal/tactile stimuli and sternal rub. Opens eyes to family members voice but not verbal. Does not stay aroused without constant stimulus. Will follow up next available time when pt able to participate.   Marguarite Arbour A Donley Harland 08/19/2015, 4:17 PM Wray Kearns, [place], DPT [phone]

## 2015-08-19 NOTE — Care Management Note (Signed)
Case Management Note  Patient Details  Name: Allison Chapman MRN: [id number] Date of Birth: May 22, 1946  Subjective/Objective:                  Date-08-19-15 Patient transferred from another unit. Spoke with patient at the bedside along with daughter Allison Chapman [phone], DIL Allison Chapman..  Introduced self as case Freight forwarder and explained role in discharge planning and Chapman to be reached.  Verified patient lives at [address], [place], [address] with daughter Allison Chapman [phone].  Verified patient anticipates to go home with  family,  at time of discharge and will have full-time supervision by family at this time to best of their knowledge.  Patient has DME walker. Expressed potential need for no other DME.  Patient denied  needing help with their medication.  Patient  is driven by daughter to MD appointments.  Verified patient has PCP, goes to [facility] for blood pressure mediaction, otherwise was healthy. Patient states they currently receive [facility] services through no one.    Plan: CM will continue to follow for discharge planning and [facility] resources.   Carles Collet RN BSN CM [phone]   Action/Plan:   Expected Discharge Date:                  Expected Discharge Plan:  Home/Self Care  In-House Referral:     Discharge planning Services  CM Consult  Post Acute Care Choice:    Choice offered to:     DME Arranged:    DME Agency:     HH Arranged:    HH Agency:     Status of Service:  In process, will continue to follow  Medicare Important Message Given:    Date Medicare IM Given:    Medicare IM give by:    Date Additional Medicare IM Given:    Additional Medicare Important Message give by:     If discussed at [place] of Stay Meetings, dates discussed:    Additional Comments:  Carles Collet, RN 08/19/2015, 11:03 AM

## 2015-08-20 ENCOUNTER — Encounter (HOSPITAL_COMMUNITY): Payer: Self-pay | Admitting: *Deleted

## 2015-08-20 ENCOUNTER — Encounter (HOSPITAL_COMMUNITY)
Admission: EM | Disposition: A | Discharge status: Home / Self Care | Payer: Self-pay | Source: Home / Self Care | Attending: Internal Medicine

## 2015-08-20 ENCOUNTER — Telehealth: Payer: Self-pay | Admitting: *Deleted

## 2015-08-20 ENCOUNTER — Other Ambulatory Visit: Payer: Self-pay | Admitting: Hematology

## 2015-08-20 ENCOUNTER — Other Ambulatory Visit: Payer: Self-pay | Admitting: *Deleted

## 2015-08-20 DIAGNOSIS — C775 Secondary and unspecified malignant neoplasm of intrapelvic lymph nodes: Secondary | ICD-10-CM

## 2015-08-20 DIAGNOSIS — D630 Anemia in neoplastic disease: Secondary | ICD-10-CM

## 2015-08-20 DIAGNOSIS — R630 Anorexia: Secondary | ICD-10-CM

## 2015-08-20 DIAGNOSIS — D72829 Elevated white blood cell count, unspecified: Secondary | ICD-10-CM

## 2015-08-20 DIAGNOSIS — C772 Secondary and unspecified malignant neoplasm of intra-abdominal lymph nodes: Secondary | ICD-10-CM

## 2015-08-20 DIAGNOSIS — E43 Unspecified severe protein-calorie malnutrition: Secondary | ICD-10-CM

## 2015-08-20 DIAGNOSIS — G9349 Other encephalopathy: Secondary | ICD-10-CM

## 2015-08-20 HISTORY — PX: FLEXIBLE SIGMOIDOSCOPY: SHX5431

## 2015-08-20 LAB — BASIC METABOLIC PANEL
ANION GAP: 14 (ref 5–15)
BUN: 21 mg/dL — AB (ref 6–20)
CALCIUM: 11.3 mg/dL — AB (ref 8.9–10.3)
CO2: 19 mmol/L — AB (ref 22–32)
CREATININE: 1.47 mg/dL — AB (ref 0.44–1.00)
Chloride: 111 mmol/L (ref 101–111)
GFR calc Af Amer: 40 mL/min — ABNORMAL LOW (ref >60)
GFR calc non Af Amer: 35 mL/min — ABNORMAL LOW (ref >60)
GLUCOSE: 121 mg/dL — AB (ref 65–99)
Potassium: 3.5 mmol/L (ref 3.5–5.1)
Sodium: 144 mmol/L (ref 135–145)

## 2015-08-20 LAB — CBC
HEMATOCRIT: 32.1 % — AB (ref 36.0–46.0)
Hemoglobin: 10.1 g/dL — ABNORMAL LOW (ref 12.0–15.0)
MCH: 29.3 pg (ref 26.0–34.0)
MCHC: 31.5 g/dL (ref 30.0–36.0)
MCV: 93 fL (ref 78.0–100.0)
Platelets: 396 10*3/uL (ref 150–400)
RBC: 3.45 MIL/uL — ABNORMAL LOW (ref 3.87–5.11)
RDW: 17.3 % — AB (ref 11.5–15.5)
WBC: 14.9 10*3/uL — ABNORMAL HIGH (ref 4.0–10.5)

## 2015-08-20 LAB — HM SIGMOIDOSCOPY

## 2015-08-20 SURGERY — SIGMOIDOSCOPY, FLEXIBLE
Anesthesia: Moderate Sedation

## 2015-08-20 MED ORDER — FUROSEMIDE 10 MG/ML IJ SOLN: 40.0000 mg | Freq: Once | INTRAMUSCULAR | Status: DC

## 2015-08-20 MED ORDER — FENTANYL CITRATE (PF) 100 MCG/2ML IJ SOLN
INTRAMUSCULAR | Status: DC | PRN
  Administered 2015-08-20 (×2): 12.5 ug via INTRAVENOUS

## 2015-08-20 MED ORDER — MIDAZOLAM HCL 5 MG/ML IJ SOLN
INTRAMUSCULAR | Status: AC
  Filled 2015-08-20: qty 2

## 2015-08-20 MED ORDER — MIDAZOLAM HCL 10 MG/2ML IJ SOLN
INTRAMUSCULAR | Status: DC | PRN
  Administered 2015-08-20: 1 mg via INTRAVENOUS

## 2015-08-20 MED ORDER — FENTANYL CITRATE (PF) 100 MCG/2ML IJ SOLN
INTRAMUSCULAR | Status: AC
  Filled 2015-08-20: qty 2

## 2015-08-20 MED ORDER — DIPHENHYDRAMINE HCL 50 MG/ML IJ SOLN
INTRAMUSCULAR | Status: AC
  Filled 2015-08-20: qty 1

## 2015-08-20 NOTE — Progress Notes (Signed)
SLP Cancellation Note  Patient Details Name: Allison Chapman MRN: [id number] DOB: 05-Nov-1945   Cancelled treatment:       Reason Eval/Treat Not Completed: Patient at procedure or test/unavailable; pt NPO for procedure; unable to complete diet check; nursing stated minimal intake yesterday (~15% of tray).   Anthonee Gelin,PAT, M.S., CCC-SLP 08/20/2015, 9:16 AM

## 2015-08-20 NOTE — Progress Notes (Signed)
Tap water enema ( 1 liter) administered. Small amount of stool was evacuated, after which the enema ran clear.

## 2015-08-20 NOTE — Telephone Encounter (Signed)
Pt is scheduled for PET scan at 1 pm on Wed 08/27/15 at [facility].  Spoke with daughter Kenney Houseman and gave her phone number to radiology scheduling dept to call for further instructions on PET scan.  Tonya voiced understanding.

## 2015-08-20 NOTE — Telephone Encounter (Signed)
Voicemail from "patient's daughter Farrel Demark.  Met Dr. Burr Medico Monday.  Instructed to call if questions.  Mom is still in the hospital.  Won't be able to come in to see Dr. Burr Medico on Thursday and I have additional questions.  Please call me at [phone]."

## 2015-08-20 NOTE — Telephone Encounter (Signed)
I have called and spoke with Allison Chapman. I have got her PET approved as outpt, and will schedule it as soon as possible. If pt is able to recover well from her hospitalization, will consider radiation and or chemo (I spoke with rad/onc Dr. Ilene Qua today). But if her performance status does not improve significantly, she may not be a candidate for aggressive chemoradiation.   I'll see her after her PET scan in my office next week.  [name]  08/20/2015

## 2015-08-20 NOTE — Telephone Encounter (Signed)
Spoke with daughter Allison Chapman and was informed re:  Pt is scheduled for colonoscopy today.  Allison Chapman would like to know if pt could be moved to [facility] hospital to be closer to the cancer center for further treatments as needed by Dr. Burr Medico.  Allison Chapman stated pt is getting weaker and her cancer treatment needs to be planned soon.  Message sent to Dr. Burr Medico. Tonya's   Phone    [phone].

## 2015-08-20 NOTE — Progress Notes (Signed)
Dinner was late coming to patient given about two bite started chocking. Suction out patient sats 95% on call notified.  Order to keep patient NPO until correct diet can be established

## 2015-08-20 NOTE — Op Note (Signed)
[facility] [place], [address]   FLEXIBLE SIGMOIDOSCOPY PROCEDURE REPORT  PATIENT: Allison Chapman, Allison Chapman  MR#: [id number] BIRTHDATE: 04/17/1946 , 69  yrs. old GENDER: female ENDOSCOPIST: Clarene Essex, MD REFERRED BY: PROCEDURE DATE:  08/20/2015 PROCEDURE:   Sigmoidoscopy, diagnostic ASA CLASS:   Class IV INDICATIONS:an abnormal CT. MEDICATIONS: Fentanyl 25 mcg IV and Versed 1 mg IV  DESCRIPTION OF PROCEDURE:   After the risks benefits and alternatives of the procedure were thoroughly explained, informed consent was obtained.  Digital exam revealed no abnormalities of the rectum. The [id number]  endoscope was introduced through the anus and advanced to the mid descending colon at about 50 cm, The exam was Without limitations.     The overall prep quality was adequate. . Estimated blood loss is zero unless otherwise noted in this procedure report. The instrument was then slowly withdrawn as the mucosa was fully examined.       The findings are recorded below         Retroflexed views revealed no abnormalities.    The scope was then withdrawn from the patient and the procedure terminated.  COMPLICATIONS: There were no immediate complications.  ENDOSCOPIC IMPRESSION: 1. Large vaginal prolapse 2. Left-sided small diverticuli 3. Otherwise within normal limits flexible sigmoidoscopy to 50 cm without obvious anal rectal pathology  RECOMMENDATIONS: per medical team and oncology await PET scan and gynecologic consult and please call me this week if I can be of any further assistance  REPEAT EXAM: as needed  eSigned:  Clarene Essex, MD 08/20/2015 2:32 PM   CC:

## 2015-08-20 NOTE — Progress Notes (Signed)
Subjective: Allison Chapman is a 69 y/o female with PMH of uterine and bladder prolapse, HTN and metastatic SCC of unknown origin admitted for hypercalcemia of malignancy, now on hospital day 3.  Overall, she seems to be more alert this morning as compared to yesterday.  She opens her eyes spontaneously to voice and is able to follow simple commands (thumbs up on both hands, squeezes fingers, states full name when asked).  History is limited as patient is not oriented to place (knows she is in a hospital but not which one) or year.  When asked specifically if she is having any pain she states, "No."    Objective: Vital signs in last 24 hours: Filed Vitals:   08/19/15 1428 08/19/15 2137 08/20/15 0546 08/20/15 0811  BP: 142/91 131/78 127/73 149/87  Pulse: 101 86 85 86  Temp: 97.8 F (36.6 C) 98 F (36.7 C) 98.3 F (36.8 C) 98.3 F (36.8 C)  TempSrc: Oral  Oral Axillary  Resp: 20 18 18 18   Height:      Weight:      SpO2: 94% 100% 100% 100%   Weight change:   Intake/Output Summary (Last 24 hours) at 08/20/15 0838 Last data filed at 08/20/15 0801  Gross per 24 hour  Intake      0 ml  Output   1550 ml  Net  -1550 ml   BP 149/87 mmHg  Pulse 86  Temp(Src) 98.3 F (36.8 C) (Axillary)  Resp 18  Ht 5\' 1"  (1.549 m)  Wt 57 kg (125 lb 10.6 oz)  BMI 23.76 kg/m2  SpO2 100%  General Appearance:   Ill-appearing, thin female.  Initially sleeping but roused to voice and touch to shoulder.  Lying in bed in no apparent distress.  Minimally interactive with interview.    Eyes:    Conjunctiva clear and anicteric.    Throat:   Moist mucus membranes  Back:     Unable to assess-- patient lying in bed but not able to sit up unassisted  Lungs:     Normal work of breathing on 1.5L O2 via nasal canula.  Unable to assess all lung fields 2/2 patient's inability to sit up   Heart:    Regular rhythm, S1 and S2 normal, no murmur  Abdomen:     Soft, non-tender, no organomegaly  Genitalia:    Deferred     Rectal:    Deferred  Extremities:   1+ pitting edema to right ankle.  No edema on left.  Right dorsal hand with edema proximal to IV insertion site.  Skin:   No obvious rash or lesions  Neurologic:   Alert to person (states full name) and oriented to hospital, but unable to name city or state.  Not oriented to year.  Able to give thumbs up bilaterally when instructed and squeezes fingers with encouragement.  Responds minimally to questions during interview.    Lab Results: -Glucose: 121 @0448  10/26 -Urine sodium 131 -Urine Creatinine 32.33  -Calculated FENa 4.2% -Calcium 11.3, corrected to 12.7  Chemistries: CMP Latest Ref Rng 08/20/2015 08/19/2015 08/18/2015  Glucose 65 - 99 mg/dL 121(H) 54(L) 82  BUN 6 - 20 mg/dL 21(H) 22(H) 24(H)  Creatinine 0.44 - 1.00 mg/dL 1.47(H) 1.66(H) 1.47(H)  Sodium 135 - 145 mmol/L 144 146(H) 147(H)  Potassium 3.5 - 5.1 mmol/L 3.5 3.6 4.0  Chloride 101 - 111 mmol/L 111 114(H) 120(H)  CO2 22 - 32 mmol/L 19(L) 19(L) 20(L)  Calcium 8.9 - 10.3 mg/dL  11.3(H) 11.6(H) 13.1(HH)  Total Protein 6.5 - 8.1 g/dL - 7.9 7.3  Total Bilirubin 0.3 - 1.2 mg/dL - 1.1 0.7  Alkaline Phos 38 - 126 U/L - 76 65  AST 15 - 41 U/L - 40 30  ALT 14 - 54 U/L - 21 17   CBC: CBC Latest Ref Rng 08/20/2015 08/19/2015 08/18/2015  WBC 4.0 - 10.5 K/uL 14.9(H) 20.2(H) 19.2(H)  Hemoglobin 12.0 - 15.0 g/dL 10.1(L) 10.4(L) 10.0(L)  Hematocrit 36.0 - 46.0 % 32.1(L) 33.6(L) 30.8(L)  Platelets 150 - 400 K/uL 396 488(H) 506(H)   Urinalysis:    Component Value Date/Time   COLORURINE YELLOW 08/19/2015 [place] 08/19/2015 1706   LABSPEC 1.012 08/19/2015 1706   PHURINE 5.0 08/19/2015 1706   GLUCOSEU NEGATIVE 08/19/2015 1706   HGBUR TRACE* 08/19/2015 1706   BILIRUBINUR NEGATIVE 08/19/2015 1706   BILIRUBINUR neg 01/15/2015 0935   KETONESUR NEGATIVE 08/19/2015 1706   PROTEINUR NEGATIVE 08/19/2015 1706   PROTEINUR 100 01/15/2015 0935   UROBILINOGEN 0.2 08/19/2015 1706    UROBILINOGEN 0.2 01/15/2015 0935   NITRITE NEGATIVE 08/19/2015 1706   NITRITE neg 01/15/2015 0935   LEUKOCYTESUR NEGATIVE 08/19/2015 1706   Micro Results: -Blood culture 10/23 no growth x2 days -Urine culture 10/24 no growth x1 day   Studies/Results: CT Head 10/23  FINDINGS: There is no evidence of acute cortical infarct, intracranial hemorrhage, mass, midline shift, or extra-axial fluid collection. Ventricles and sulci are within normal limits for age. Small, chronic infarcts are again seen in the deep gray nuclei and deep cerebral white matter bilaterally. Confluent hypodensities in the subcortical and deep cerebral white matter bilaterally are unchanged. The visualized portions of the orbits are unremarkable. No significant inflammatory change in the visualized paranasal sinuses. The no destructive skull lesions. Calcified atherosclerosis at the skullbase.   IMPRESSION: 1. No evidence of acute intracranial abnormality. 2. 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 3. Unchanged cerebral white matter disease, nonspecific but may reflect a advanced chronic small vessel change. Electronically Signed By: Logan Bores M.D. On: 08/17/2015 20:04  2 View Chest 10/23  FINDINGS: Heart size and pulmonary vascularity are normal. Minimal atelectasis at the lung bases due to a shallow inspiration. The lungs are otherwise clear. No osseous abnormality.   IMPRESSION: No active cardiopulmonary disease. Electronically Signed By: Lorriane Shire M.D. On: 08/17/2015 19:54  MRI 10/24  FINDINGS: The patient was unable to remain motionless for the exam. Small or subtle lesions could be overlooked. No evidence for acute infarction, hemorrhage, mass lesion, hydrocephalus, or extra-axial fluid. Moderate cerebral and cerebellar atrophy. Extensive T2 and FLAIR hyperintensity throughout the periventricular and subcortical white matter, representing small vessel disease. Numerous  microbleeds throughout the brain including cerebellum, brainstem, and cerebral hemispheres, likely sequelae of hypertensive cerebrovascular disease. Areas of chronic hemorrhagic lacunar infarction are seen in the LEFT thalamus and LEFT lentiform nucleus. Flow voids are maintained in the carotid, basilar, and both vertebral arteries. Other than callosal atrophy, no midline abnormality. Extracranial soft tissues show no acute findings. Within limits for detection on a noncontrast exam, no intracranial metastatic disease.   IMPRESSION: Chronic changes as described. No acute stroke is evident. 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 Electronically Signed By: Staci Righter M.D. On: 08/18/2015 12:25  Medications: -Calcitonin 254 Units BID injection for 48 hrs (started 10/23, 4 doses total, last dose 10/25) -Pamidronate 60mg  in 58mL NS IV (once,10/24) -Magnesium sulfate 2g  IV (once, 10/24) -Carvedilol 3.125mg  BID oral, with meals (started 10/25 & continuing) -Heparin 5,000 units subcutaneous TID (started 10/23 & continuing) -Zosyn 3.375g IV q8hr (started 10/24, discontinued 10/26) -NS 73ml IV q12hr (started 10/23) -NS 169ml/hr continuous IV (started 10/23, D/C'd 10/25) -D5NS 119ml/hr continuous started 10/25 --> continue 10/26 -40mg  IV Lasix (one time dose 10/26)  Diet: -Ensure feeding supplement, 237 mL 2x between meals (started 10/25)  Assessment/Plan: Active Problems:   Hypercalcemia   Metastatic squamous cell carcinoma (HCC)  Allison Chapman is a 69 y/o female with PMH significant for uterineb/ladder prolapse, HTN and metastatic SCC of unknown origin, who is now on hospital day 3 for inpatient management of hypercalcemia 2/2 malignancy, admitted with calcium >15, now s/p 48 hours of calcitonin and one time dose of IV bisphosphonates and continued IV fluids.  Corrected calcium remains elevated at 12.7 today.   Overall mental status seems to be improving as compared to yesterday but patient remains disoriented to place and time. Physical exam today is suggestive of volume overload from IV fluids, so plan is to diuresis with one time dose of 40mg  IV lasix today. Patient's care has been complicated by AKI in the context of aggressive fluid administration.  FENa of 4.2% is suggestive of post-renal cause, and she has known extrinsic compression of ureters 2/2 bladder prolapse.  Will need to monitor closely for any signs of worsening kidney injury.  Additionally, patient is having sigmoidoscopy today to look for rectal source of metastatic SCC.  Family meeting scheduled for today at 2:30 to discuss overall anticipated course of care (per family's request), as patient will need to have outpatient work-up for unknown primary if sigmoidoscopy today does not identify a source.  #Hypercalcemia: Presumed to be secondary to malignant SCC of unknown primary.   -Continue IV fluids @125mL /hr D5NS -Continue to trend calcium and albumin   #Fluid Overload -One time dose of 40mg  IV lasix for fluid overload   -Pt 100% sat on 1.5L O2, will wean today but monitor closely for any signs of pulmonary edema  -Monitor for signs of worsening kidney injury  #Leukocytosis and lactic acidosis without a clear source:  -WBC count today decreased to 14.9 from 20.2 yesterday.  Patient has not had any fever during this admission and blood cultures show no growth x2 days.  Question if WBC elevation is an acute phase reactant elevation in context of malignancy -Zosyn discontinued 10/26  #AKI: FENa today is 4.2%, suggestive of post-renal cause.   -Creatinine down to 1.47 from 1.66 -Trend BUN and creatinine with repeat CMP 10/27 -Suspect obstruction is related to extrinsic ureter compression from bladder prolapse, but stone also in ddx as hypercalcemia places patient at elevated risk for nephrolithiasis.  UA showed only a few RBCs and there  was no evidence of flank pain on exam today. -Consider renal U/S if patient develops new signs/symptoms of stone    #Metastatic Squamous Cell Carcinoma, Unknown Primary, suspicious for anorectal SCC:  -Sigmoidoscopy 10/26 to search for primary source; f/u results -F/u with heme/onc team to clarify plan for outpatient PET scan -Gyn/onc outpatient appointment scheduled for 10/31 @1 :45PM  #Right-sided facial droop: Resolved.  -Brain MRI 10/24 negative for acute stroke  #Dispo: Deferred at this time pending improvement of hypercalcemia.   -Family meeting today at 2:30pm to discuss overall treatment plans with family. -Pt has the following outpatient appointments already scheduled:  Follow up with Donaciano Eva, MD On 08/25/2015.    Specialty: Obstetrics and Gynecology  Why: at the [facility]. Arrive at 1:15pm for a 1:45pm appointment. Valet parking free of charge at the front of the Center.   Contact information:   [place] [address]  [phone]      Appointments for the Next Year    08/21/2015 3:00 PM NEW PATIENT 60 45 min [place] [[id number]]   08/25/2015 9:00 AM IV MED 240 240 min [place] [[id number]] --> this is the pamidronate infusion   08/25/2015 1:45 PM NEW PATIENT 30 30 min [facility] Gynecological Oncology [[id number]]   08/29/2015 3:15 PM SAME DAY 30 60 min [place] [[id number]]    This is a Careers information officer Note.  The care of the patient was discussed with Dr. Melburn Hake and the assessment and plan formulated with their assistance.  Please see their attached note for official documentation of the daily encounter.   LOS: 3 days   Ladell Pier, Med Student 08/20/2015, 8:38 AM

## 2015-08-20 NOTE — Progress Notes (Signed)
OT Cancellation Note  Patient Details Name: Allison Chapman MRN: [id number] DOB: 10-09-1946   Cancelled Treatment:    Reason Eval/Treat Not Completed: Patient at procedure or test/ unavailable.  Will reattempt later today as schedule allows, or tomorrow.   Darlina Rumpf Tye, OTR/L [phone]  08/20/2015, 11:29 AM

## 2015-08-20 NOTE — Progress Notes (Signed)
PT Cancellation Note  Patient Details Name: Allison Chapman MRN: [id number] DOB: 1946/06/16   Cancelled Treatment:    Reason Eval/Treat Not Completed: Patient's level of consciousness (nurse reports she was up all night).  Will try later as time allows.   Ramond Dial 08/20/2015, 7:45 AM   Mee Hives, PT MS Acute Rehab Dept. Number: [facility] [id number] and [place] [phone]

## 2015-08-20 NOTE — Consult Note (Signed)
Reason for Consult: Request for flexible sigmoidoscopy Referring Physician: Hospital team and oncology Dr. Thayer Dallas Allison Chapman is an 69 y.o. female.  HPI: Patient seen and examined unfortunately no family is here today and case discussed with oncology and primary team and she denies any GI issues and her history and computer chart was reviewed and they request flexible sigmoidoscopy to rule out anal rectal cancer causing her metastatic squamous cell cancer unfortunately I am unable to find out at this time if she's never had this procedure before or not  Past Medical History  Diagnosis Date  . Hypertension   . Encephalopathy acute   . Prolapsed uterus     stage IV  . Vaginal bleeding   . 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 Tubal ligation      Family History  Problem Relation Age of Onset  . Hypertension Mother   . Cancer Mother   . Lymphoma Mother   . Hyperthyroidism Daughter     Social History:  reports that she has never smoked. She does not have any smokeless tobacco history on file. She reports that she does not drink alcohol or use illicit drugs.  Allergies: No Known Allergies  Medications: I have reviewed the patient's current medications.  Results for orders placed or performed during the hospital encounter of 08/17/15 (from the past 48 hour(s))  Lactic acid, plasma     Status: Abnormal   Collection Time: 08/18/15  6:39 PM  Result Value Ref Range   Lactic Acid, Venous 3.3 (HH) 0.5 - 2.0 mmol/L    Comment: CRITICAL RESULT CALLED TO, READ BACK BY AND VERIFIED WITH: A SWALLOWFORD,RN 2003 08/18/2015 WBOND   Lactic acid, plasma     Status: None   Collection Time: 08/18/15 10:15 PM  Result Value Ref Range   Lactic Acid, Venous 1.8 0.5 - 2.0 mmol/L  Comprehensive metabolic panel     Status: Abnormal   Collection Time: 08/19/15  5:30 AM  Result Value Ref Range   Sodium 146 (H) 135 - 145 mmol/L   Potassium 3.6 3.5 - 5.1 mmol/L   Chloride 114 (H) 101 - 111 mmol/L   CO2 19 (L) 22 - 32 mmol/L   Glucose, Bld 54 (L) 65 - 99 mg/dL   BUN 22 (H) 6 - 20 mg/dL   Creatinine, Ser 1.66 (H) 0.44 - 1.00 mg/dL   Calcium 11.6 (H) 8.9 - 10.3 mg/dL   Total Protein 7.9 6.5 - 8.1 g/dL   Albumin 2.6 (L) 3.5 - 5.0 g/dL   AST 40 15 - 41 U/L   ALT 21 14 - 54 U/L   Alkaline Phosphatase 76 38 - 126 U/L   Total Bilirubin 1.1 0.3 - 1.2 mg/dL   GFR calc non Af Amer 30 (L) >60 mL/min   GFR calc Af Amer 35 (L) >60 mL/min    Comment: (NOTE) The eGFR has been calculated using the CKD EPI equation. This calculation has not been validated in all clinical situations. eGFR's persistently <60 mL/min signify possible Chronic Kidney Disease.    Anion gap 13 5 - 15  CBC     Status: Abnormal   Collection Time: 08/19/15  8:22 AM  Result Value Ref Range   WBC 20.2 (H) 4.0 - 10.5 K/uL   RBC 3.62 (L) 3.87 - 5.11 MIL/uL   Hemoglobin 10.4 (L) 12.0 - 15.0 g/dL  HCT 33.6 (L) 36.0 - 46.0 %   MCV 92.8 78.0 - 100.0 fL   MCH 28.7 26.0 - 34.0 pg   MCHC 31.0 30.0 - 36.0 g/dL   RDW 17.1 (H) 11.5 - 15.5 %   Platelets 488 (H) 150 - 400 K/uL  Urinalysis, Routine w reflex microscopic (not at [facility])     Status: Abnormal   Collection Time: 08/19/15  5:06 PM  Result Value Ref Range   Color, Urine YELLOW YELLOW   APPearance CLEAR CLEAR   Specific Gravity, Urine 1.012 1.005 - 1.030   pH 5.0 5.0 - 8.0   Glucose, UA NEGATIVE NEGATIVE mg/dL   Hgb urine dipstick TRACE (A) NEGATIVE   Bilirubin Urine NEGATIVE NEGATIVE   Ketones, ur NEGATIVE NEGATIVE mg/dL   Protein, ur NEGATIVE NEGATIVE mg/dL   Urobilinogen, UA 0.2 0.0 - 1.0 mg/dL   Nitrite NEGATIVE NEGATIVE   Leukocytes, UA NEGATIVE NEGATIVE  Urine microscopic-add on     Status: None   Collection Time: 08/19/15  5:06 PM  Result Value Ref Range   Squamous Epithelial / LPF RARE RARE   WBC, UA 3-6 <3 WBC/hpf   RBC / HPF 0-2 <3 RBC/hpf  Sodium,  urine, random     Status: None   Collection Time: 08/19/15  5:07 PM  Result Value Ref Range   Sodium, Ur 131 mmol/L  Creatinine, urine, random     Status: None   Collection Time: 08/19/15  5:07 PM  Result Value Ref Range   Creatinine, Urine 32.22 mg/dL  CBC     Status: Abnormal   Collection Time: 08/20/15  4:48 AM  Result Value Ref Range   WBC 14.9 (H) 4.0 - 10.5 K/uL   RBC 3.45 (L) 3.87 - 5.11 MIL/uL   Hemoglobin 10.1 (L) 12.0 - 15.0 g/dL   HCT 32.1 (L) 36.0 - 46.0 %   MCV 93.0 78.0 - 100.0 fL   MCH 29.3 26.0 - 34.0 pg   MCHC 31.5 30.0 - 36.0 g/dL   RDW 17.3 (H) 11.5 - 15.5 %   Platelets 396 150 - 400 K/uL  Basic metabolic panel     Status: Abnormal   Collection Time: 08/20/15  4:48 AM  Result Value Ref Range   Sodium 144 135 - 145 mmol/L   Potassium 3.5 3.5 - 5.1 mmol/L   Chloride 111 101 - 111 mmol/L   CO2 19 (L) 22 - 32 mmol/L   Glucose, Bld 121 (H) 65 - 99 mg/dL   BUN 21 (H) 6 - 20 mg/dL   Creatinine, Ser 1.47 (H) 0.44 - 1.00 mg/dL   Calcium 11.3 (H) 8.9 - 10.3 mg/dL   GFR calc non Af Amer 35 (L) >60 mL/min   GFR calc Af Amer 40 (L) >60 mL/min    Comment: (NOTE) The eGFR has been calculated using the CKD EPI equation. This calculation has not been validated in all clinical situations. eGFR's persistently <60 mL/min signify possible Chronic Kidney Disease.    Anion gap 14 5 - 15    No results found.  ROS negative except above although patient is difficult to know if she is getting the right answers Blood pressure 151/91, pulse 82, temperature 97.6 F (36.4 C), temperature source Oral, resp. rate 18, height 5' 1"  (1.549 m), weight 57 kg (125 lb 10.6 oz), SpO2 96 %. Physical Exam no acute distress lying comfortably in the bed vital signs stable afebrile exam please see preassessment evaluation labs and CT reviewed  Assessment/Plan:  Metastatic squamous cell cancer Plan: Will proceed with flexible sigmoidoscopy later today and she's received 2 enemas with further  workup and plans pending those findings although expect PET scan and gynecologic consult to be more revealing  [place] E 08/20/2015, 1:01 PM

## 2015-08-20 NOTE — Progress Notes (Signed)
Patient ID: Allison Chapman, female   DOB: 14-Sep-1946, 69 y.o.   MRN: [id number]   Subjective: [name] continues to be minimally responsive this morning, but squeezes our fingers. She doesn't appear to be in pain. The family is requesting a family meeting around 2:30 to get a better understanding of her work-up going forward.  Objective: Vital signs in last 24 hours: Filed Vitals:   08/19/15 1428 08/19/15 2137 08/20/15 0546 08/20/15 0811  BP: 142/91 131/78 127/73 149/87  Pulse: 101 86 85 86  Temp: 97.8 F (36.6 C) 98 F (36.7 C) 98.3 F (36.8 C) 98.3 F (36.8 C)  TempSrc: Oral  Oral Axillary  Resp: 20 18 18 18   Height:      Weight:      SpO2: 94% 100% 100% 100%   General: Frail African-American lady resting in bed, less responsive than yesterday HEENT: no scleral icterus. EOMI. Subtle right-sided facial droop improved. Cardiac: Tachycardic but regular, no rubs, murmurs or gallops Pulm: Breathing well, some bibasilar crackles Abd: soft, nontender, nondistended, BS present Ext: warm and well perfused, new 1+ pitting edema, more on right than left Lymph: no cervical LAD Skin: no rash Neuro: AOx0, cranial nerves II-XII grossly intact  Lab Results: Basic Metabolic Panel:  Recent Labs Lab 08/17/15 2128 08/18/15 0029 08/18/15 0626 08/19/15 0530 08/20/15 0448  NA  --  142 147* 146* 144  K  --  4.2 4.0 3.6 3.5  CL  --  112* 120* 114* 111  CO2  --  20* 20* 19* 19*  GLUCOSE  --  88 82 54* 121*  BUN  --  28* 24* 22* 21*  CREATININE  --  1.64* 1.47* 1.66* 1.47*  CALCIUM  --  >15.0* 13.1* 11.6* 11.3*  MG 1.8 1.6* 2.3  --   --   PHOS 3.9  --   --   --   --    CBC:  Recent Labs Lab 08/18/15 0029 08/18/15 0626 08/19/15 0822 08/20/15 0448  WBC 19.4* 19.2* 20.2* 14.9*  NEUTROABS 17.4* 17.3*  --   --   HGB 11.6* 10.0* 10.4* 10.1*  HCT 35.5* 30.8* 33.6* 32.1*  MCV 90.3 90.9 92.8 93.0  PLT 484* 506* 488* 396   Urinalysis:  Recent Labs Lab 08/18/15 0209  08/19/15 1706  COLORURINE YELLOW YELLOW  LABSPEC 1.008 1.012  PHURINE 5.0 5.0  GLUCOSEU NEGATIVE NEGATIVE  HGBUR TRACE* TRACE*  BILIRUBINUR NEGATIVE NEGATIVE  KETONESUR NEGATIVE NEGATIVE  PROTEINUR NEGATIVE NEGATIVE  UROBILINOGEN 0.2 0.2  NITRITE NEGATIVE NEGATIVE  LEUKOCYTESUR NEGATIVE NEGATIVE   Micro Results: Recent Results (from the past 240 hour(s))  Blood culture (routine x 2)     Status: None (Preliminary result)   Collection Time: 08/17/15  8:07 PM  Result Value Ref Range Status   Specimen Description BLOOD RIGHT HAND  Final   Special Requests IN PEDIATRIC BOTTLE 1CC  Final   Culture NO GROWTH 2 DAYS  Final   Report Status PENDING  Incomplete  Blood culture (routine x 2)     Status: None (Preliminary result)   Collection Time: 08/17/15  8:20 PM  Result Value Ref Range Status   Specimen Description BLOOD LEFT HAND  Final   Special Requests BOTTLES DRAWN AEROBIC ONLY 5CC  Final   Culture NO GROWTH 2 DAYS  Final   Report Status PENDING  Incomplete  MRSA PCR Screening     Status: None   Collection Time: 08/17/15 10:49 PM  Result Value Ref Range Status  MRSA by PCR NEGATIVE NEGATIVE Final    Comment:        The GeneXpert MRSA Assay (FDA approved for NASAL specimens only), is one component of a comprehensive MRSA colonization surveillance program. It is not intended to diagnose MRSA infection nor to guide or monitor treatment for MRSA infections.   Urine culture     Status: None   Collection Time: 08/18/15 10:00 AM  Result Value Ref Range Status   Specimen Description URINE, CATHETERIZED  Final   Special Requests NONE  Final   Culture NO GROWTH 1 DAY  Final   Report Status 08/19/2015 FINAL  Final   Studies/Results: Mr Brain Wo Contrast  08/18/2015  CLINICAL DATA:  Recent hospitalization for encephalopathy due to hypercalcemia. History of squamous cell malignancy of unknown origin. Confusion, speech difficulty. RIGHT-sided facial droop. Initial encounter.  EXAM: MRI HEAD WITHOUT CONTRAST TECHNIQUE: Multiplanar, multiecho pulse sequences of the brain and surrounding structures were obtained without intravenous contrast. COMPARISON:  CT head 08/17/2015.  CT head 07/31/2015. FINDINGS: The patient was unable to remain motionless for the exam. Small or subtle lesions could be overlooked. No evidence for acute infarction, hemorrhage, mass lesion, hydrocephalus, or extra-axial fluid. Moderate cerebral and cerebellar atrophy. Extensive T2 and FLAIR hyperintensity throughout the periventricular and subcortical white matter, representing small vessel disease. Numerous microbleeds throughout the brain including cerebellum, brainstem, and cerebral hemispheres, likely sequelae of hypertensive cerebrovascular disease. Areas of chronic hemorrhagic lacunar infarction are seen in the LEFT thalamus and LEFT lentiform nucleus. Flow voids are maintained in the carotid, basilar, and both vertebral arteries. Other than callosal atrophy, no midline abnormality. Extracranial soft tissues show no acute findings. Within limits for detection on a noncontrast exam, no intracranial metastatic disease. IMPRESSION: Chronic changes as described.  No acute stroke is evident. 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 Electronically Signed   By: Staci Righter M.D.   On: 08/18/2015 12:25   Medications: I have reviewed the patient's current medications. Scheduled Meds: . carvedilol  3.125 mg Oral BID WC  . feeding supplement (ENSURE ENLIVE)  237 mL Oral BID BM  . heparin  5,000 Units Subcutaneous 3 times per day  . piperacillin-tazobactam (ZOSYN)  IV  3.375 g Intravenous Q8H  . sodium chloride  3 mL Intravenous Q12H   Continuous Infusions: . sodium chloride    . dextrose 5 % and 0.45% NaCl 125 mL/hr at 08/20/15 0545    Assessment/Plan:  [name] calcium continues to improve with fluids but she's becoming a  bit fluid overloaded. We'll keep her fluids running and give her 40IV lasix to help get her calcium down and get some fluid off. 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 Hopefully she'll be ready for discharge tomorrow so she can get her outpatient work-up rolling for her metastatic SCC. She'll get a sigmoidoscopy today to look for rectal cancer and we'll meet with the family at 61.  Hypercalcemia: Per above, we'll give lasix and continue her fluids. -Continue D5 1/2 NS 125cc/hr -IV lasix 40mg  once -BMP tomorrow am  Leukocytosis without a clear source: Urinalysis was clean, and blood cultures remain NGTD. We'll stop her Zosyn. -Stopped Zosyn -Follow blood cultures -CBC tomorrow  Metastatic Squamous Cell Carcinoma, Unknown Primary, suspicious for anorectal SCC: She'll get a sigmoidoscopy today to evaluate for rectal cancer. We'll meet with  the family  -Thanks GI for your help -Thanks oncology for your help -Sigmoidoscopy today -Family meeting today at 10  Dispo: Disposition is deferred at this time, awaiting improvement of current medical problems. Hopefully discharge tomorrow.  The patient does have a current PCP (Carly Montey Hora, MD) and does need an [facility] hospital follow-up appointment after discharge.  The patient does have transportation limitations that hinder transportation to clinic appointments.  .Services Needed at time of discharge: Y = Yes, Blank = No PT:   OT:   RN:   Equipment:   Other:     LOS: 3 days   [name], MD 08/20/2015, 9:21 AM

## 2015-08-20 NOTE — Progress Notes (Signed)
  Date: 08/20/2015  Patient name: Allison Chapman  Medical record number: [id number]  Date of birth: 1946/01/05   I have personally seen and evaluated Ms. Calvillo.  Her plan of care was discussed with the house staff. Please see Dr. Melburn Hake' note for complete details. I concur with his findings.   Sid Falcon, MD 08/20/2015, 2:26 PM

## 2015-08-20 NOTE — Progress Notes (Signed)
I spoke on the phone this afternoon with Dr. Burr Medico, Hem-Onc. Patient does not need to be seen in her clinic tomorrow for the appointment since she has been seeing her in the hospital. Dr. Burr Medico will follow along with [place] and will schedule the patient a PET scan and appointments with Hem-Onc and Rad-Onc once patient is discharged from the hospital. The tentative plan is for the patient to be discharged tomorrow (08/21/2015) or the day after with likely following up with Hem-Onc and Rad-Onc early next week, and PET scan early next week. PET scan cannot be scheduled until the patient is discharged from the hospital.   Patient also has an appointment with Gyn-Onc on 10/31.   At the follow up appointment with Dr. Burr Medico, she can check her calcium levels and give her IVF if needed.   Dr. Burr Medico requests an in-basket message when the patient is being discharged in order to begin coordination of care planning.  Osa Craver, DO PGY-2 Internal Medicine Resident Pager # [phone] 08/20/2015 4:50 PM

## 2015-08-20 NOTE — Progress Notes (Signed)
Allison Chapman   DOB:71/30/1947   SE#:[id number]   WVP#:[id number]  Subjective: Pt underwent partial colonoscopy right Dr. Watt Climes today, which was negative for malignant lesions. I saw her on the floor in the late afternoon, she was very drowsy, although arousable, but disoriented. I spoke with her daughter-in-law at bedside.    Objective:  Filed Vitals:   08/20/15 1446  BP: 141/92  Pulse:   Temp:   Resp: 16    Body mass index is 23.76 kg/(m^2).  Intake/Output Summary (Last 24 hours) at 08/20/15 1954 Last data filed at 08/20/15 1911  Gross per 24 hour  Intake      0 ml  Output   1500 ml  Net  -1500 ml     Sclerae unicteric  Oropharynx clear  No peripheral adenopathy  Lungs clear -- no rales or rhonchi  Heart regular rate and rhythm  Abdomen benign  MSK no focal spinal tenderness, no peripheral edema  Neuro nonfocal   CBG (last 3)  No results for input(s): GLUCAP in the last 72 hours.   Labs:  Lab Results  Component Value Date   WBC 14.9* 08/20/2015   HGB 10.1* 08/20/2015   HCT 32.1* 08/20/2015   MCV 93.0 08/20/2015   PLT 396 08/20/2015   NEUTROABS 17.3* 08/18/2015    @LASTCHEMISTRY @  Urine Studies No results for input(s): UHGB, CRYS in the last 72 hours.  Invalid input(s): UACOL, UAPR, USPG, UPH, UTP, UGL, UKET, UBIL, UNIT, UROB, ULEU, UEPI, UWBC, URBC, UBAC, CAST, [place], [place]  Basic Metabolic Panel:  Recent Labs Lab 08/17/15 1838 08/17/15 1851 08/17/15 2128 08/18/15 0029 08/18/15 0626 08/19/15 0530 08/20/15 0448  NA 138 139  --  142 147* 146* 144  K 5.1 5.7*  --  4.2 4.0 3.6 3.5  CL 104 113*  --  112* 120* 114* 111  CO2 19*  --   --  20* 20* 19* 19*  GLUCOSE 87 89  --  88 82 54* 121*  BUN 31* 45*  --  28* 24* 22* 21*  CREATININE 1.88* 2.00*  --  1.64* 1.47* 1.66* 1.47*  CALCIUM >15.0*  --   --  >15.0* 13.1* 11.6* 11.3*  MG  --   --  1.8 1.6* 2.3  --   --   PHOS  --   --  3.9  --   --   --   --    GFR Estimated Creatinine Clearance: 26.5  mL/min (by C-G formula based on Cr of 1.47). Liver Function Tests:  Recent Labs Lab 08/17/15 1838 08/18/15 0029 08/18/15 0626 08/19/15 0530  AST 31 33 30 40  ALT 17 17 17 21   ALKPHOS 74 75 65 76  BILITOT 0.8 0.8 0.7 1.1  PROT 8.6* 8.1 7.3 7.9  ALBUMIN 2.9* 2.7* 2.4* 2.6*   No results for input(s): LIPASE, AMYLASE in the last 168 hours. No results for input(s): AMMONIA in the last 168 hours. Coagulation profile No results for input(s): INR, PROTIME in the last 168 hours.  CBC:  Recent Labs Lab 08/17/15 1838 08/17/15 1851 08/18/15 0029 08/18/15 0626 08/19/15 0822 08/20/15 0448  WBC 18.8*  --  19.4* 19.2* 20.2* 14.9*  NEUTROABS  --   --  17.4* 17.3*  --   --   HGB 11.7* 14.6 11.6* 10.0* 10.4* 10.1*  HCT 36.2 43.0 35.5* 30.8* 33.6* 32.1*  MCV 90.0  --  90.3 90.9 92.8 93.0  PLT 493*  --  484* 506* 488* 396  Cardiac Enzymes: No results for input(s): CKTOTAL, CKMB, CKMBINDEX, TROPONINI in the last 168 hours. BNP: Invalid input(s): POCBNP CBG:  Recent Labs Lab 08/17/15 1808  GLUCAP 92   D-Dimer No results for input(s): DDIMER in the last 72 hours. Hgb A1c No results for input(s): HGBA1C in the last 72 hours. Lipid Profile No results for input(s): CHOL, HDL, LDLCALC, TRIG, CHOLHDL, LDLDIRECT in the last 72 hours. Thyroid function studies No results for input(s): TSH, T4TOTAL, T3FREE, THYROIDAB in the last 72 hours.  Invalid input(s): FREET3 Anemia work up No results for input(s): VITAMINB12, FOLATE, FERRITIN, TIBC, IRON, RETICCTPCT in the last 72 hours. Microbiology Recent Results (from the past 240 hour(s))  Blood culture (routine x 2)     Status: None (Preliminary result)   Collection Time: 08/17/15  8:07 PM  Result Value Ref Range Status   Specimen Description BLOOD RIGHT HAND  Final   Special Requests IN PEDIATRIC BOTTLE [place]  Final   Culture NO GROWTH 3 DAYS  Final   Report Status PENDING  Incomplete  Blood culture (routine x 2)     Status: None  (Preliminary result)   Collection Time: 08/17/15  8:20 PM  Result Value Ref Range Status   Specimen Description BLOOD LEFT HAND  Final   Special Requests BOTTLES DRAWN AEROBIC ONLY 5CC  Final   Culture NO GROWTH 3 DAYS  Final   Report Status PENDING  Incomplete  MRSA PCR Screening     Status: None   Collection Time: 08/17/15 10:49 PM  Result Value Ref Range Status   MRSA by PCR NEGATIVE NEGATIVE Final    Comment:        The GeneXpert MRSA Assay (FDA approved for NASAL specimens only), is one component of a comprehensive MRSA colonization surveillance program. It is not intended to diagnose MRSA infection nor to guide or monitor treatment for MRSA infections.   Urine culture     Status: None   Collection Time: 08/18/15 10:00 AM  Result Value Ref Range Status   Specimen Description URINE, CATHETERIZED  Final   Special Requests NONE  Final   Culture NO GROWTH 1 DAY  Final   Report Status 08/19/2015 FINAL  Final      Studies:  No results found.  Assessment: 69 y.o. female   1. 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 Malignancy related hypercalcemia 3. Encephalopathy secondary to hypercalcemia 4. Uterine prolapse 5. 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 Thrombocytosis, likely reactive 7. Anorexia, severe malnutrition and deconditioning  Plan:  -Sigmoidoscopy was negative for colorectal or anal primary -Her PET scan has been approved, we'll schedule for early next week. Her insurance requires the PET scan to be done at least 72 hours after hospital discharge -I dicussed the overall poor prognosis with her daughter-in-law today. If her performance status does not improve and her encephalopathy does not resolve, she may not be able to tolerate chemo or radiation.  -If she is able to go home this week, I will see her back early next week, check her lab and give IVF if needed -I recommend mirtazapine 15mg  HS for her anorexia -I strongly  encourage her to take nutrition supplement  -I will speak with pathology tomorrow to see if any additional IHC studies will help [place] to differentiate the primary  -I will see her and finalize her treatment plan after her PET scan. If her PS improves, I would like to start her treatment in a week  or two.    [name], MD 08/20/2015  7:54 PM

## 2015-08-20 NOTE — Progress Notes (Signed)
Tap water enema ( 1 liter) administered. Small amount of stool was evacuated, after which the enema ran clear. Will repeat at 1100 per endo instructions.

## 2015-08-20 NOTE — Progress Notes (Signed)
Final tap water enema administered (3L this time). Small bits of stool debris expelled during first two liters. Last liter came out completely clear.

## 2015-08-20 NOTE — Telephone Encounter (Signed)
I called back and left a message to call back.   [name]  08/20/2015

## 2015-08-21 ENCOUNTER — Ambulatory Visit: Payer: Medicare PPO | Admitting: Hematology

## 2015-08-21 ENCOUNTER — Encounter (HOSPITAL_COMMUNITY): Payer: Self-pay | Admitting: Gastroenterology

## 2015-08-21 LAB — CBC
HEMATOCRIT: 28.7 % — AB (ref 36.0–46.0)
HEMOGLOBIN: 9.3 g/dL — AB (ref 12.0–15.0)
MCH: 29.5 pg (ref 26.0–34.0)
MCHC: 32.4 g/dL (ref 30.0–36.0)
MCV: 91.1 fL (ref 78.0–100.0)
PLATELETS: 342 10*3/uL (ref 150–400)
RBC: 3.15 MIL/uL — AB (ref 3.87–5.11)
RDW: 17.3 % — ABNORMAL HIGH (ref 11.5–15.5)
WBC: 13.3 10*3/uL — AB (ref 4.0–10.5)

## 2015-08-21 LAB — BASIC METABOLIC PANEL
ANION GAP: 7 (ref 5–15)
BUN: 15 mg/dL (ref 6–20)
CHLORIDE: 107 mmol/L (ref 101–111)
CO2: 21 mmol/L — AB (ref 22–32)
Calcium: 10.3 mg/dL (ref 8.9–10.3)
Creatinine, Ser: 1.25 mg/dL — ABNORMAL HIGH (ref 0.44–1.00)
GFR calc Af Amer: 49 mL/min — ABNORMAL LOW (ref >60)
GFR calc non Af Amer: 42 mL/min — ABNORMAL LOW (ref >60)
GLUCOSE: 144 mg/dL — AB (ref 65–99)
POTASSIUM: 3 mmol/L — AB (ref 3.5–5.1)
Sodium: 135 mmol/L (ref 135–145)

## 2015-08-21 MED ORDER — FUROSEMIDE 10 MG/ML IJ SOLN
40.0000 mg | Freq: Once | INTRAMUSCULAR | Status: AC
  Administered 2015-08-21: 40 mg via INTRAVENOUS
  Filled 2015-08-21: qty 4

## 2015-08-21 MED ORDER — POTASSIUM CHLORIDE 10 MEQ/100ML IV SOLN
10.0000 meq | INTRAVENOUS | Status: AC
  Administered 2015-08-21 (×4): 10 meq via INTRAVENOUS
  Filled 2015-08-21 (×5): qty 100

## 2015-08-21 MED ORDER — ENSURE ENLIVE PO LIQD: 237.0000 mL | Freq: Two times a day (BID) | ORAL | Status: DC

## 2015-08-21 MED ORDER — CARVEDILOL 3.125 MG PO TABS: 3.1250 mg | ORAL_TABLET | Freq: Two times a day (BID) | ORAL | Status: AC

## 2015-08-21 MED ORDER — MIRTAZAPINE 15 MG PO TABS: 15.0000 mg | ORAL_TABLET | Freq: Every day | ORAL | Status: DC

## 2015-08-21 MED ORDER — CALCITONIN (SALMON) 200 UNIT/ACT NA SOLN: 1.0000 | Freq: Every day | NASAL | Status: AC

## 2015-08-21 MED ORDER — MIRTAZAPINE 15 MG PO TABS: 15.0000 mg | ORAL_TABLET | Freq: Every day | ORAL | Status: AC

## 2015-08-21 NOTE — Progress Notes (Signed)
Patient ID: Allison Chapman, female   DOB: [birth year], 69 y.o.   MRN: [id number]   Subjective: Allison Chapman was smiling, conversant, and overall much more alert today. She continues to deny any pain and says she's breathing just fine despite getting all of the fluids. We explained to her daughter-in-law the plan moving forward with hopes of getting her home today if she's able to swallow her pills so that she can follow-up with Dr. Burr Medico as soon as possible.  Objective: Vital signs in last 24 hours: Filed Vitals:   08/20/15 1440 08/20/15 1446 08/20/15 2201 08/21/15 0439  BP:  141/92 138/74 149/82  Pulse:   80 84  Temp: 97.5 F (36.4 C)  98 F (36.7 C) 97.5 F (36.4 C)  TempSrc: Axillary  Oral Oral  Resp:  16 18 18   Height:      Weight:      SpO2:  98% 97% 91%   General: Frail African-American lady resting in bed, much more alert and talkative today HEENT: no scleral icterus. EOMI.  Cardiac: Regular rate and rhythm, no rubs, murmurs or gallops Pulm: Breathing well, clear anteriorly Abd: soft, nontender, nondistended, BS present Ext: warm and well perfused, unchanged 1+ pitting edema, more on right than left Lymph: no cervical LAD Skin: no rash Neuro: AOx0, cranial nerves II-XII grossly intact  Lab Results: Basic Metabolic Panel:  Recent Labs Lab 08/17/15 2128 08/18/15 0029 08/18/15 0626  08/20/15 0448 08/21/15 0530  NA  --  142 147*  < > 144 135  K  --  4.2 4.0  < > 3.5 3.0*  CL  --  112* 120*  < > 111 107  CO2  --  20* 20*  < > 19* 21*  GLUCOSE  --  88 82  < > 121* 144*  BUN  --  28* 24*  < > 21* 15  CREATININE  --  1.64* 1.47*  < > 1.47* 1.25*  CALCIUM  --  >15.0* 13.1*  < > 11.3* 10.3  MG 1.8 1.6* 2.3  --   --   --   PHOS 3.9  --   --   --   --   --   < > = values in this interval not displayed.   CBC:  Recent Labs Lab 08/18/15 0029 08/18/15 0626  08/20/15 0448 08/21/15 0530  WBC 19.4* 19.2*  < > 14.9* 13.3*  NEUTROABS 17.4* 17.3*  --   --   --   HGB  11.6* 10.0*  < > 10.1* 9.3*  HCT 35.5* 30.8*  < > 32.1* 28.7*  MCV 90.3 90.9  < > 93.0 91.1  PLT 484* 506*  < > 396 342  < > = values in this interval not displayed.   Micro Results: Blood cultures NGTD x 4 days  Medications: I have reviewed the patient's current medications. Scheduled Meds: . carvedilol  3.125 mg Oral BID WC  . feeding supplement (ENSURE ENLIVE)  237 mL Oral BID BM  . furosemide  40 mg Intravenous Once  . heparin  5,000 Units Subcutaneous 3 times per day  . sodium chloride  3 mL Intravenous Q12H   Continuous Infusions: . sodium chloride    . dextrose 5 % and 0.45% NaCl 125 mL/hr at 08/21/15 0122   PRN Meds:.  Assessment/Plan:  Allison Chapman calcium has normalized and her cognition has improved considerably. Once speech therapy clears her for swallowing her pills and liquids and she can void without her  foley she's good to go home with extensive medical supplies. We'll give her another dose of lasix, turn down her fluids, and remove her foley this afternoon. It's important she stays hydrated once she leaves to keep her calcium levels down. We'll give her some intranasal calcitonin as well, and she's due for another Pamidronate infusion on 10/31 which is perfectly 7 days since her last dose. I'll notify Dr. Burr Medico when she's discharged so she can have close follow-up to re-check her calcium in the clinic, give her IV fluids if necessary, and get the cancer work-up rolling, which is what the family direly wants. Her sigmoidoscopy did not show rectal cancer, and we're questioning whether her prolapsed uterus may have been irritating her vaginal wall and she could have hidden squamous cell cancer there. Of course we'll defer to oncology and gyn-onc for the extensive work-up  Hypercalcemia: Per above, her calcium has normalized; we'll give some lasix and drop her fluid rate. -Drop D5 1/2 NS to 75cc/hr -IV lasix 40mg  once -Discharge with intranasal calcitonin -Pamidronate  infusion 10/31 at [facility] clinic  Aspiration risk: Per above, speech will evaluate her today; if she's able to take her pills, she's safe to go home. -Speech to see  Metastatic Squamous Cell Carcinoma, Unknown Primary, suspicious for anorectal SCC: Per above, perhaps this is vaginal cancer cause by chronic irritation from her prolapsed uterus? Pap smear and sigmoidoscopy were normal. -Thanks oncology for your help  Leukocytosis without a clear source: Resolved and blood cultures remain NGTD. We stopped her Zosyn yesterday. -No need for antibiotics  Dispo: Disposition is deferred at this time, awaiting improvement of current medical problems.  The patient does have a current PCP (Allison Montey Hora, MD) and does need an [facility] hospital follow-up appointment after discharge.  The patient does have transportation limitations that hinder transportation to clinic appointments.  .Services Needed at time of discharge: Y = Yes, Blank = No PT:   OT:   RN:   Equipment:   Other:     LOS: 4 days   [name], MD 08/21/2015, 7:02 AM

## 2015-08-21 NOTE — Progress Notes (Signed)
NURSING PROGRESS NOTE  Allison Chapman [id number] Discharge Data: 08/21/2015 5:09 PM Attending Provider: Sid Falcon, MD BUL:AGTXM, Bethann Berkshire, MD     Loann Quill discharged Home per MD order.  Discussed with the patient the After Visit Summary and all questions fully answered. All IV's discontinued with no bleeding noted. All belongings returned to patient for patient to take home. Spoke with Dr Melburn Hake regarding PT's recommendation for SNF, MD states family is adamant about taking patient home.  Last Vital Signs:  Blood pressure 149/82, pulse 84, temperature 97.5 F (36.4 C), temperature source Oral, resp. rate 18, height 5\' 1"  (1.549 m), weight 57 kg (125 lb 10.6 oz), SpO2 91 %.  Discharge Medication List   Medication List    STOP taking these medications        sulfamethoxazole-trimethoprim 800-160 MG tablet  Commonly known as:  BACTRIM DS,SEPTRA DS      TAKE these medications        calcitonin (salmon) 200 UNIT/ACT nasal spray  Commonly known as:  MIACALCIN  Place 1 spray into alternate nostrils daily.     carvedilol 3.125 MG tablet  Commonly known as:  COREG  Take 1 tablet (3.125 mg total) by mouth 2 (two) times daily with a meal.     feeding supplement (ENSURE ENLIVE) Liqd  Take 237 mLs by mouth 2 (two) times daily between meals.     mirtazapine 15 MG tablet  Commonly known as:  REMERON  Take 1 tablet (15 mg total) by mouth at bedtime.     pravastatin 20 MG tablet  Commonly known as:  PRAVACHOL  Take 1 tablet (20 mg total) by mouth daily.         Doristine Devoid, RN

## 2015-08-21 NOTE — Evaluation (Signed)
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 She demonstrates the below listed deficits and will benefit from continued OT to maximize safety and independence with BADLs.  Pt presents to OT with impaired cogntion, impaired balance, and generalized weakness.  She currently requires max - total A for ADLs and max a for functional mobility.   Per daughter in law, she anticipates the plan will be home with [facility], but unsure if family will be able to manage pt at her current care needs.  If she does discharge home, recommend HHOT, HHPT, Bernard, HHSW, HHaide, Hospital bed, hoyer lift, wheelchair.       Follow Up Recommendations  SNF;Supervision/Assistance - 24 hour.  Daughter in law reports she feels daughter will prefer to take pt home, unsure they will be able to manage pt at current level of care    Equipment Recommendations  3 in 1 bedside comode;Wheelchair ;Hospital bed, hoyer lift   Recommendations for Other Services       Precautions / Restrictions Precautions Precautions: Fall Precaution Comments: incontinence, prolapsed uterus Restrictions Weight Bearing Restrictions: No      Mobility Bed Mobility Overal bed mobility: Needs Assistance Bed Mobility: Supine to Sit;Sit to Supine Rolling: Min assist   Supine to sit: Max assist Sit to supine: Total assist   General bed mobility comments: Pt requires assist to move LEs off bed, but is able to assist.  Requires max asisst to lift trunk   Transfers Overall transfer level: Needs assistance Equipment used: None Transfers: Sit to/from Stand Sit to Stand: Max assist         General transfer comment: Pt required  max a to move into standing, and fatigues once she moves into standing     Balance Overall balance assessment: Needs assistance Sitting-balance support: Feet supported Sitting balance-Leahy Scale: Poor Sitting balance - Comments: Pt with posterior bias requiring min - mod A to maintain sitting balance EOB  Postural control: Posterior lean Standing balance support: Bilateral upper extremity supported Standing balance-Leahy Scale: Poor Standing balance comment: requires max a to maintain                             ADL Overall ADL's : Needs assistance/impaired Eating/Feeding: NPO   Grooming: Wash/dry hands;Wash/dry face;Oral care;Brushing hair;Maximal assistance;Total assistance;Sitting   Upper Body Bathing: Total assistance;Sitting   Lower Body Bathing: Total assistance;Sit to/from stand   Upper Body Dressing : Total assistance;Sitting   Lower Body Dressing: Total assistance;Sit to/from stand   Toilet Transfer: Maximal assistance;Stand-pivot;BSC   Toileting- Clothing Manipulation and Hygiene: Total assistance;Sit to/from stand       Functional mobility during ADLs: Maximal assistance General ADL Comments: Pt unable to sustain attention to task, requires max A to initiate ADLs, and fatigues before she is able to complete activity      Vision     Perception     Praxis      Pertinent Vitals/Pain Pain Assessment: No/denies pain     Hand Dominance Right   Extremity/Trunk Assessment Upper Extremity Assessment Upper Extremity Assessment: RUE deficits/detail;Generalized weakness RUE Deficits / Details: Pt moves Rt UE more sluggishly than Lt UE.  Shoulder elevation limted  to ~70*   Lower Extremity Assessment Lower Extremity Assessment: Defer to PT evaluation   Cervical / Trunk Assessment Cervical / Trunk Assessment: Kyphotic   Communication Communication Communication: Expressive difficulties (Pt with very little vocalization.  Slow to respons )    Cognition Arousal/Alertness: Lethargic;Awake/alert Behavior During Therapy: Flat affect Overall Cognitive Status: Impaired/Different from baseline Area of Impairment: Orientation;Attention;Following commands;Safety/judgement;Problem solving Orientation Level: Disoriented to;Time Current Attention Level: Sustained Memory: Decreased short-term memory Following Commands: Follows one step commands inconsistently;Follows one step commands with increased time Safety/Judgement: Decreased awareness of deficits Awareness: Intellectual Problem Solving: Slow processing;Decreased initiation;Difficulty sequencing;Requires verbal cues;Requires tactile cues General Comments: Pt is very slow to respond to questions and commands.  Requires mod - max assist to initiate activity    General Comments       Exercises       Shoulder Instructions      Home Living Family/patient expects to be discharged to:: Private residence Living Arrangements: Children Available Help at Discharge: Friend(s);Available PRN/intermittently (daughter working) Type of Home: House Home Access: Stairs to enter Technical brewer of Steps: 3 Entrance Stairs-Rails: Right Home Layout: One level     Bathroom Shower/Tub: Teacher, early years/pre: Standard Bathroom Accessibility: Yes   Home Equipment: Cane - single point;Walker - 2 wheels;Bedside commode (used RW most recently)   Additional Comments: Pt lives with her daughter and dauther in law stays with her during the day       Prior Functioning/Environment Level of Independence: Needs assistance  Gait / Transfers Assistance Needed: Per daughter in law, pt has been ambulating short distances with RW and intermittent assist ADL's / Homemaking Assistance Needed: Per daughter in law, pt has been requiring assistance with ADLs   Comments: Daughter in law reports pt was fully independent prior to fall 2-3 wks PTA, and since that time, care needs have been  increasing     OT Diagnosis: Generalized weakness;Cognitive deficits   OT Problem List: Decreased range of motion;Decreased strength;Decreased activity tolerance;Impaired balance (sitting and/or standing);Decreased coordination;Decreased cognition;Decreased safety awareness;Decreased knowledge of use of DME or AE   OT Treatment/Interventions: Self-care/ADL training;DME and/or AE instruction;Therapeutic activities;Cognitive remediation/compensation;Patient/family education;Balance training    OT Goals(Current goals can be found in the care plan section) Acute Rehab OT Goals Patient Stated Goal: none stated OT Goal Formulation: Patient unable to participate in goal setting Time For Goal Achievement: 09/04/15 Potential to Achieve Goals: Good ADL Goals Pt Will Perform Grooming: with min assist;sitting Pt Will Perform Upper Body Bathing: with mod assist;sitting Pt Will Perform Lower Body Bathing: with mod assist;sit to/from stand Pt Will Perform Upper Body Dressing: with mod assist;sitting Pt Will Perform Lower Body Dressing: with max assist;sit to/from stand Pt Will Transfer to Toilet: with min assist;stand pivot transfer;bedside commode Pt Will Perform Toileting - Clothing Manipulation and hygiene: with mod assist;sit to/from stand  OT Frequency: Min 2X/week   Barriers to D/C: Decreased caregiver support  unsure if family is able to provide necessary level of care        Co-evaluation              End of Session Equipment Utilized During Treatment: Rolling walker Nurse Communication: Mobility status  Activity Tolerance: Patient limited by fatigue Patient left: in bed;with call bell/phone within reach;with family/visitor present   Time: 1212-1240 OT Time Calculation (min): 28 min Charges:  OT General Charges $OT Visit: 1 Procedure OT Evaluation $Initial OT Evaluation Tier I: 1 Procedure OT Treatments $Therapeutic Activity: 8-22 mins G-Codes:  Dorthey Depace  M 08/21/2015, 1:00 PM

## 2015-08-21 NOTE — Care Management Note (Signed)
Case Management Note  Patient Details  Name: Allison Chapman MRN: [id number] Date of Birth: 1945/12/29  Subjective/Objective:                 Spoke with teaching service team. Dr Daryll Drown believes that family will want to take patient home with [facility] at discharge. Patient active with [facility] prior o admission, although services had not been initiated due to scheduling conflicts with patient's daughter (unintentional barrier).    Action/Plan:  Expect discharge to home after swallow evaluation today. [place] referral made to [facility], and resumption of services order placed by CM.    Expected Discharge Date:                  Expected Discharge Plan:  [place]  In-House Referral:  Clinical Social Work  Discharge planning Services  CM Consult  Post Acute Care Choice:  Home Health Choice offered to:  Adult Children  DME Arranged:    DME Agency:     HH Arranged:  RN, PT, OT, Nurse's Aide, Speech Therapy, Social Work HH Agency:  [facility]  Status of Service:  Completed, signed off  Medicare Important Message Given:  Yes-second notification given Date Medicare IM Given:    Medicare IM give by:    Date Additional Medicare IM Given:    Additional Medicare Important Message give by:     If discussed at [place] of Stay Meetings, dates discussed:    Additional Comments:  Carles Collet, RN 08/21/2015, 12:53 PM

## 2015-08-21 NOTE — Progress Notes (Signed)
Pharmacist Provided - Patient Medication Education Prior to Discharge   Allison Chapman is an 69 y.o. female who presented to [facility] on 08/17/2015 with a chief complaint of  Chief Complaint  Patient presents with  . Altered Mental Status     [x]  Patient will be discharged with 4 new medications []  Patient being discharged without any new medications  The following medications were discussed with the patient: Calcitonin, Carvedilol, Ensure, Mirtazepine.   Pain Control medications: []  Yes    [x]  No  Diabetes Medications: []  Yes    [x]  No  Heart Failure Medications: []  Yes    [x]  No  Anticoagulation Medications:  []  Yes    [x]  No  Antibiotics at discharge: []  Yes    [x]  No  Allergy Assessment Completed and Updated: [x]  Yes    []  No Identified Patient Allergies: No Known Allergies   Medication Adherence Assessment: [x]  Excellent (no doses missed/week)      []  Good (1 dose missed/week)      []  Partial (2-3 doses missed/week)      []  Poor (>3 doses missed/week)  Barriers to Obtaining Medications: []  Yes [x]  No   Assessment: Discussed discharge medications with two of patients caregivers/family members.  Discussed administration of the nasal spray and other medications, Side effects, and addressed any questions about the regimen in regards to time of dosing that the caregivers had since they will be giving the pt her medications at home.  Caregivers demonstrated understanding.   Time spent preparing for discharge counseling: 10 min Time spent counseling patient: 20 min  Kem Parkinson, PharmD 08/21/2015, 5:47 PM

## 2015-08-21 NOTE — Care Management Note (Signed)
Case Management Note  Patient Details  Name: Allison Chapman MRN: [id number] Date of Birth: [birth year]  Subjective/Objective:                  Date-08-19-15 Patient transferred from another unit. Spoke with patient at the bedside along with daughter Allison Chapman [phone], DIL Allison Chapman..  Introduced self as case Freight forwarder and explained role in discharge planning and how to be reached.  Verified patient lives at [address] [place], [address] with daughter Allison Chapman [phone].  Verified patient anticipates to go home with family, at time of discharge and will have full-time supervision by family at this time to best of their knowledge.  Patient has DME walker. Expressed potential need for no other DME.  Patient denied needing help with their medication.  Patient is driven by daughter to MD appointments.  Verified patient has PCP, goes to [facility] for blood pressure mediaction, otherwise was healthy. Patient states they currently receive [place] services through no one.    Plan: CM will continue to follow for discharge planning and [facility] resources.     Action/Plan:  08-21-15- LM with daughter Allison Chapman and spoke to daughter Allison Chapman. Allison Chapman stated that they do not need hospital bed at this time, and would be interested in obtaining it through their PMD if needed in the future. She stated that they have access to a wheelchair and would not need one ordered and also have a bedside table and a bedside commode. Tonya denied any further DME assistance.   HH arranged through [facility]. Updated Tonya that DC should be anticipated today.   Expected Discharge Date:                  Expected Discharge Plan:  [place]  In-House Referral:  Clinical Social Work  Discharge planning Services  CM Consult  Post Acute Care Choice:  Home Health Choice offered to:  Adult Children  DME Arranged:    DME Agency:     HH Arranged:  RN, PT, OT, Nurse's Aide, Speech Therapy,  Social Work HH Agency:  [facility]  Status of Service:  Completed, signed off  Medicare Important Message Given:  Yes-second notification given Date Medicare IM Given:    Medicare IM give by:    Date Additional Medicare IM Given:    Additional Medicare Important Message give by:     If discussed at [place] of Stay Meetings, dates discussed:    Additional Comments:  Carles Collet, RN 08/21/2015, 1:15 PM

## 2015-08-21 NOTE — Progress Notes (Signed)
Subjective: Allison Chapman is a 69 y/o female with PMH of uterine and bladder prolapse, HTN and metastatic SCC of unknown origin admitted for hypercalcemia of malignancy, now on hospital day 4.  Patient's daughter-in-law was present at beside and states that she seems to be improving overall, and is much more alert and talkative.  She was also able to get up and work with PT yesterday.  Patient tolerated sigmoidoscopy well, but had some problems with a subsequent swallow study and was made NPO.  She rouses to voice and seems visibly excited about the prospect of going home, stating "yes" when asked if ready to leave the hospital soon.  No complaints of pain.  Was visited yesterday in the hospital by Dr. Burr Medico from heme/onc to help coordinate plan for work up.   Objective: Vital signs in last 24 hours: Filed Vitals:   08/20/15 1440 08/20/15 1446 08/20/15 2201 08/21/15 0439  BP:  141/92 138/74 149/82  Pulse:   80 84  Temp: 97.5 F (36.4 C)  98 F (36.7 C) 97.5 F (36.4 C)  TempSrc: Axillary  Oral Oral  Resp:  16 18 18   Height:      Weight:      SpO2:  98% 97% 91%   Weight change:   Intake/Output Summary (Last 24 hours) at 08/21/15 1138 Last data filed at 08/21/15 0939  Gross per 24 hour  Intake      0 ml  Output   2000 ml  Net  -2000 ml   General: Sleeping comfortably in bed in NAD. HEENT: Sclera are anicteric. No thrush.  Neck: No JVD Cardiovascular:  Regular rate and rhythm Pulmonary: Normal work of breathing.  Unable to assess pulmonary lung fields as patient is lying in bed and is unable to sit up unassisted.  Some course breath sounds heard on auscultation of the lungs that sound like referred upper airway sounds. Abdomen: Deferred MSK: 2+ pitting edema to ankle on right, unchanged from exam yesterday Neuro: Rouses to voice and seems more willing to answer questions today.  Orientation not formally assessed at this time.  Lab Results: Corrected calcium using albumin from  10/25 is 11.4 today. CMP Latest Ref Rng 08/21/2015 08/20/2015 08/19/2015  Glucose 65 - 99 mg/dL 144(H) 121(H) 54(L)  BUN 6 - 20 mg/dL 15 21(H) 22(H)  Creatinine 0.44 - 1.00 mg/dL 1.25(H) 1.47(H) 1.66(H)  Sodium 135 - 145 mmol/L 135 144 146(H)  Potassium 3.5 - 5.1 mmol/L 3.0(L) 3.5 3.6  Chloride 101 - 111 mmol/L 107 111 114(H)  CO2 22 - 32 mmol/L 21(L) 19(L) 19(L)  Calcium 8.9 - 10.3 mg/dL 10.3 11.3(H) 11.6(H)  Total Protein 6.5 - 8.1 g/dL - - 7.9  Total Bilirubin 0.3 - 1.2 mg/dL - - 1.1  Alkaline Phos 38 - 126 U/L - - 76  AST 15 - 41 U/L - - 40  ALT 14 - 54 U/L - - 21   CBC Latest Ref Rng 08/21/2015 08/20/2015 08/19/2015  WBC 4.0 - 10.5 K/uL 13.3(H) 14.9(H) 20.2(H)  Hemoglobin 12.0 - 15.0 g/dL 9.3(L) 10.1(L) 10.4(L)  Hematocrit 36.0 - 46.0 % 28.7(L) 32.1(L) 33.6(L)  Platelets 150 - 400 K/uL 342 396 488(H)   Micro Results: -Blood culture 10/23 no growth x2 days -Urine culture 10/24 no growth x1 day  Studies/Results: -Sigmoidoscopy 10/26 did not find any obvious primary source of metastatic SCC -See prior progress notes for additional information about prior imaging studies this admission.  Medications this admission: -Calcitonin 254 Units BID  injection for 48 hrs (started 10/23, 4 doses total, last dose 10/25) -Pamidronate 60mg  in 559mL NS IV (once,10/24) -Magnesium sulfate 2g IV (once, 10/24) -Carvedilol 3.125mg  BID oral, with meals (started 10/25 & continuing) -Heparin 5,000 units subcutaneous TID (started 10/23 & continuing) -Zosyn 3.375g IV q8hr (started 10/24, discontinued 10/26) -NS 55ml IV q12hr (started 10/23) -NS 14ml/hr continuous IV (started 10/23, D/C'd 10/25) -D5NS 166ml/hr continuous started 10/25 --> reduced to 37ml/hr continuous 10/27 -40mg  IV Lasix (one time dose 10/26 held, will administer 10/27 prior to discharge)  Assessment/Plan: Active Problems:   Hypercalcemia   Metastatic squamous cell carcinoma (HCC)  Allison Chapman is a 69 y/o female with PMH  significant for uterineb/ladder prolapse, HTN and metastatic SCC of unknown origin, who is now on hospital day 4 for inpatient management of hypercalcemia 2/2 malignancy, continuing to improve.  Corrected calcium today is 11.4, lower than it has been this entire admission, and patient is much more alert.  Dose of IV Lasix was held yesterday since patient had sigmoidoscopy, so will administer this afternoon and plan to discontinue foley catheter later this evening to ensure that patient is able to void.  Will also need to ensure patient is able to maintain PO fluid intake before discharge.  Pending results of swallow study today, hopeful for discharge tonight or tomorrow morning.  #Hypercalcemia: Presumed to be secondary to malignant SCC of unknown primary.  -Corrected calcium today 11.4, will need to ensure calcium levels remain controlled with good PO intake -Pt passed swallow study today, no longer NPO -Reduce IV fluids to 75 ml/hr D5NS & encourage PO fluid intake -Discontinue foley and ensure patient able to void  #Fluid Overload -One time dose of 40mg  IV lasix for fluid overload prior to discahrge  #Leukocytosis and lactic acidosis without a clear source: suspect this is a reactive leukocytosis in setting of cancer; patient has been afebrile throughout admission -WBC 13.3 today, downtrending over last 3 days.   -Zosyn was discontinued 10/26  #AKI: Improving -FENa 10/26 of 4.2%, suggestive of post-renal cause. Suspect obstruction is related to extrinsic ureter compression from bladder prolapse, but stone also in ddx as hypercalcemia places patient at elevated risk for nephrolithiasis. UA 10/26 showed only a few RBCs and there was no evidence of flank pain on exam today. -Creatinine 10/27 is 1.25, downtrending over the last 3 days  #Metastatic Squamous Cell Carcinoma, Unknown Primary, suspicious for anorectal SCC:  -Sigmoidoscopy 10/26 negative for any apparently primary source -Pt seen  by Dr. Burr Medico 10/26 while inpatient; outpatient PET scan scheduled for 11/2 -Gyn/onc outpatient appointment scheduled for 10/31 @1 :45PM  #Right-sided facial droop: Resolved.  -Brain MRI 10/24 negative for acute stroke  #Decreased PO intake -Mirtazipine 15mg  for anorexia per recommendations from Dr. Burr Medico  #Dispo: Discharge today -Plan to control hypercalcemia with intranasal calcitonin x3 days and calcium recheck on 10/31 prior to scheduled pamidronate IV infusion  -Follow-up with appointments as above (IV infusion & Gyn/Onc appointment 10/31, PET scan 11/2) -Follow-up with Dr. Burr Medico for further care coordination of outpatient work up early next week  This is a Careers information officer Note.  The care of the patient was discussed with Dr. Melburn Hake and the assessment and plan formulated with their assistance.  Please see their attached note for official documentation of the daily encounter.   LOS: 4 days   Ladell Pier, Med Student 08/21/2015, 11:38 AM

## 2015-08-21 NOTE — Discharge Instructions (Signed)
Hypercalcemia Hypercalcemia is having too much calcium in the blood. The body needs calcium to make bones and keep them strong. Calcium also helps the muscles, nerves, brain, and heart work the way they should. Most of the calcium in the body is in the bones. There is also some calcium in the blood. Hypercalcemia can happen when calcium comes out of the bones, or when the kidneys are not able to remove calcium from the blood. Hypercalcemia can be mild or severe. CAUSES There are many possible causes of hypercalcemia. Common causes include:  Hyperparathyroidism. This is a condition in which the body produces too much parathyroid hormone. There are four parathyroid glands in your neck. These glands produce a chemical messenger (hormone) that helps the body absorb calcium from foods and helps your bones release calcium.  Certain kinds of cancer, such as lung cancer, breast cancer, or myeloma. Less common causes of hypercalcemia include:   Getting too much calcium or vitamin D from your diet.  Kidney failure.  Hyperthyroidism.  Being on bed rest for a long time.  Certain medicines.  Infections.  Sarcoidosis. RISK FACTORS This condition is more likely to develop in:  Women.  People who are 60 years or older.  People who have a family history of hypercalcemia. SYMPTOMS  Mild hypercalcemia that starts slowly may not cause symptoms. Severe, sudden hypercalcemia is more likely to cause symptoms, such as:  Loss of appetite.  Increased thirst and frequent urination.  Fatigue.  Nausea and vomiting.  Headache.  Abdominal pain.  Muscle pain, twitching, or weakness.  Constipation.  Blood in the urine.  Pain in the side of the back (flank pain).  Anxiety, confusion, or depression.  Irregular heartbeat (arrhythmia).  Loss of consciousness. DIAGNOSIS  This condition may be diagnosed based on:   Your symptoms.  Blood tests.  Urine  tests.  X-rays.  Ultrasound.  MRI.  CT scan. TREATMENT  Treatment for hypercalcemia depends on the cause. Treatment may include:  Receiving fluids through an IV tube.  Medicines that keep calcium levels steady after receiving fluids (loop diuretics).  Medicines that keep calcium in your bones (bisphosphonates).  Medicines that lower the calcium level in your blood.  Surgery to remove overactive parathyroid glands. HOME CARE INSTRUCTIONS  Take over-the-counter and prescription medicines only as told by your health care provider.  Follow instructions from your health care provider about eating or drinking restrictions.  Drink enough fluid to keep your urine clear or pale yellow.  Stay active. Weight-bearing exercise helps to keep calcium in your bones. Follow instructions from your health care provider about what type and level of exercise is safe for you.  Keep all follow-up visits as told by your health care provider. This is important. SEEK MEDICAL CARE IF:  You have a fever.  You have flank or abdominal pain that is getting worse. SEEK IMMEDIATE MEDICAL CARE IF:   You have severe abdominal or flank pain.  You have chest pain.  You have trouble breathing.  You become very confused and sleepy.  You lose consciousness.   This information is not intended to replace advice given to you by your health care provider. Make sure you discuss any questions you have with your health care provider.   Document Released: 12/25/2004 Document Revised: 07/02/2015 Document Reviewed: 02/26/2015 Elsevier Interactive Patient Education 2016 Elsevier Inc.  Calcitonin nasal spray What is this medicine? CALCITONIN (kal si TOE nin) stops bone loss and breaks. It will make your bones more dense,  or thicker. This medicine is used to treat postmenopausal osteoporosis. This medicine may be used for other purposes; ask your health care provider or pharmacist if you have questions. What  should I tell my health care provider before I take this medicine? They need to know if you have any of these conditions: -nasal sores or trauma -an unusual or allergic reaction to calcitonin, fish, other medicines, foods, dyes, or preservatives -pregnant or trying to get pregnant -breast-feeding How should I use this medicine? This medicine is only for use in the nose. Do not take by mouth. Follow the directions on your prescription label. Alternate nostrils daily. Do not use more often than directed. Make sure that you are using your nasal spray correctly. Ask you doctor or health care provider if you have any questions. Talk to your pediatrician regarding the use of this medicine in children. Special care may be needed. Overdosage: If you think you have taken too much of this medicine contact a poison control center or emergency room at once. NOTE: This medicine is only for you. Do not share this medicine with others. What if I miss a dose? If you miss a dose, use it as soon as you can. If it is almost time for your next dose, use only that dose. Do not use double or extra doses. What may interact with this medicine? -prior bone scan with diphosphonate in patient with Paget's disease This list may not describe all possible interactions. Give your health care provider a list of all the medicines, herbs, non-prescription drugs, or dietary supplements you use. Also tell them if you smoke, drink alcohol, or use illegal drugs. Some items may interact with your medicine. What should I watch for while using this medicine? Visit your doctor or health care professional for regular checks on your progress. Your doctor will need to exam the inside of your nose before you start using the medicine and periodically during use. Talk to your doctor about your risk of cancer. You may be more at risk for certain types of cancers if you take this medicine. Be sure that you get at least 1000 mg of calcium and 400  I.U. of vitamin D while you are taking this medicine. This will help the medicine work better. Ask your doctor or health care provider for good nutrition advice. What side effects may I notice from receiving this medicine? Side effects that you should report to your doctor or health care professional as soon as possible: -allergic reactions like skin rash, itching or hives, swelling of the face, lips, or tongue -breathing problems -chest pain, tightness -dizziness -infection or fever -nosebleed or nose sores -tingling in the hands or feet Side effects that usually do not require medical attention (report to your doctor or health care professional if they continue or are bothersome): -back or joint pain -flushing -headache -nausea or upset stomach -runny, stuffy, or irritated nose -tremors -watery eyes This list may not describe all possible side effects. Call your doctor for medical advice about side effects. You may report side effects to FDA at 1-800-FDA-1088. Where should I keep my medicine? Keep out of the reach of children. Store the unopened medicine in a refrigerator between 2 and 8 degrees C (36 and 46 degrees F). Do not freeze. Once opened store at room temperature between 15 and 30 degrees C (59 and 86 degrees F) in an upright position for up to 35 days. Throw away any unused medicine after the expiration date. NOTE:  This sheet is a summary. It may not cover all possible information. If you have questions about this medicine, talk to your doctor, pharmacist, or health care provider.    2016, Elsevier/Gold Standard. (2013-01-04 12:11:43)

## 2015-08-21 NOTE — Progress Notes (Signed)
  Date: 08/21/2015  Patient name: Allison Chapman  Medical record number: [id number]  Date of birth: September 01, 1946   I have personally seen and evaluated [name] with the resident team.  Her plan of care was discussed with the house staff. Please see Dr. Melburn Hake' note for complete details. I concur with his findings.  Other issues at discharge will be discontinuation of the foley catheter.  Will d/c and see if she can void on her own.  She can likely discharge home today given her Ca is improved.  She has follow up for further evaluation of her cancer next week.  The team has been communicating closely with Oncology, the patient and her family.  Likely discharge today.    Sid Falcon, MD 08/21/2015, 3:33 PM

## 2015-08-21 NOTE — Evaluation (Signed)
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  Her ability to stand with assistance and step slightly bode well for continued recovery, but did not sit up in chair for further strengthening due to her fatigue from poor sleep last night.     Follow Up Recommendations SNF    Equipment Recommendations  None recommended by PT (await disposition from SNF)    Recommendations for Other Services Rehab consult     Precautions / Restrictions Precautions Precautions: Fall Precaution Comments: incontinence, prolapsed uterus Restrictions Weight Bearing Restrictions: No      Mobility  Bed Mobility Overal bed mobility: Needs Assistance Bed Mobility: Rolling;Supine to Sit;Sit to Supine Rolling: Min assist   Supine to sit: Mod assist Sit to supine: Max assist      Transfers Overall transfer level: Needs assistance Equipment used: Rolling walker (2 wheeled) Transfers: Sit to/from Stand Sit to Stand: Mod assist         General transfer comment: mult attempts to stand with some flexion of knees, equally challenged by PT directly assisting her   Ambulation/Gait             General Gait Details: attempted sidesteps but too weak in legs to sucessfully actually step  Stairs            Wheelchair Mobility    Modified Rankin (Stroke Patients Only)       Balance Overall balance assessment: Needs assistance Sitting-balance support: Feet supported;Bilateral upper extremity supported Sitting balance-Leahy Scale: Poor   Postural control: Posterior lean Standing balance support: Bilateral upper extremity supported Standing  balance-Leahy Scale: Poor                               Pertinent Vitals/Pain Pain Assessment: No/denies pain    Home Living Family/patient expects to be discharged to:: Private residence Living Arrangements: Children Available Help at Discharge: Friend(s);Available PRN/intermittently (daughter working) Type of Home: House Home Access: Stairs to enter Entrance Stairs-Rails: Right Entrance Stairs-Number of Steps: 3 Home Layout: One level Home Equipment: Cane - single point;Walker - 2 wheels;Bedside commode (used RW most recently)      Prior Function Level of Independence: Independent with assistive device(s)               Hand Dominance   Dominant Hand: Right    Extremity/Trunk Assessment   Upper Extremity Assessment: Generalized weakness           Lower Extremity Assessment: Generalized weakness      Cervical / Trunk Assessment: Kyphotic  Communication   Communication: Expressive difficulties  Cognition Arousal/Alertness: Awake/alert Behavior During Therapy: Flat affect Overall Cognitive Status: Impaired/Different from baseline Area of Impairment: Following commands;Safety/judgement;Problem solving;Awareness;Attention   Current Attention Level: Selective Memory: Decreased recall of precautions;Decreased short-term memory Following Commands: Follows one step commands inconsistently;Follows one step commands with increased time Safety/Judgement: Decreased awareness of safety;Decreased awareness of deficits Awareness: Intellectual Problem Solving: Slow processing;Difficulty sequencing;Requires verbal cues General Comments: Pt responding to questions with difficulty some of the time, sometimes no answer    General Comments General comments (skin integrity, edema, etc.): Daughter in law present to provide some  details of her home situation.  Very supportive of pt's care.    Exercises        Assessment/Plan    PT Assessment Patient needs  continued PT services  PT Diagnosis Generalized weakness   PT Problem List Decreased strength;Decreased range of motion;Decreased activity tolerance;Decreased balance;Decreased mobility;Decreased coordination;Decreased cognition;Decreased knowledge of use of DME;Decreased safety awareness;Cardiopulmonary status limiting activity  PT Treatment Interventions DME instruction;Gait training;Functional mobility training;Therapeutic activities;Therapeutic exercise;Balance training;Neuromuscular re-education;Patient/family education;Stair training;Cognitive remediation   PT Goals (Current goals can be found in the Care Plan section) Acute Rehab PT Goals Patient Stated Goal: none stated PT Goal Formulation: With patient/family Time For Goal Achievement: 09/04/15 Potential to Achieve Goals: Good    Frequency Min 2X/week   Barriers to discharge Decreased caregiver support;Inaccessible home environment has no help consistently at home    Co-evaluation               End of Session   Activity Tolerance: Patient limited by fatigue;Patient limited by lethargy Patient left: in bed;with call bell/phone within reach;with family/visitor present Nurse Communication: Mobility status         Time: 0927-1000 PT Time Calculation (min) (ACUTE ONLY): 33 min   Charges:   PT Evaluation $Initial PT Evaluation Tier I: 1 Procedure PT Treatments $Therapeutic Activity: 8-22 mins   PT G Codes:        Ramond Dial 09/04/2015, 11:19 AM   Mee Hives, PT MS Acute Rehab Dept. Number: [phone] [id number] and [place] [phone]

## 2015-08-21 NOTE — Progress Notes (Signed)
Speech Language Pathology Treatment: Dysphagia  Patient Details Name: Allison Chapman MRN: [id number] DOB: 16-Aug-1946 Today's Date: 08/21/2015 Time: 0120-0135 SLP Time Calculation (min) (ACUTE ONLY): 15 min  Assessment / Plan / Recommendation Clinical Impression  During dysphagia treatment, pt tolerated puree and thin liquids with no overt s/s of aspiration. Pt continues to hold puree bolus, likely due to AMS and poor attention/awareness of feeding. SLP provided maximal verbal reminders for pt to swallow puree. Due to pt's mentation and presentation as a dependent feeder, it is very important the follow compensations are followed: feed with full supervision and only when pt is alert and upright, small bites and sips, alternate bites and sips, slow rate, and verbal cue to swallow food. SLP will f/u with check for diet tolerance and progress solids as pt's mentation improves.    HPI Other Pertinent Information: 70 y.o. 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 MRI and CXR neagtive.    Pertinent Vitals Pain Assessment: Faces Faces Pain Scale: No hurt  SLP Plan  Continue with current plan of care    Recommendations Diet recommendations: Dysphagia 1 (puree);Thin liquid Liquids provided via: Cup;Straw Medication Administration: Crushed with puree Supervision: Staff to assist with self feeding;Full supervision/cueing for compensatory strategies Compensations: Follow solids with liquid;Check for pocketing;Small sips/bites;Slow rate;Other (Comment) (verbal cue to swallow) Postural Changes and/or Swallow Maneuvers: Seated upright 90 degrees              Oral Care Recommendations: Oral care BID Plan: Continue with current plan of care    GO    Lanier Ensign, Student-SLP  Lanier Ensign 08/21/2015, 1:45 PM

## 2015-08-22 ENCOUNTER — Other Ambulatory Visit (HOSPITAL_COMMUNITY): Payer: Self-pay | Admitting: *Deleted

## 2015-08-22 LAB — CULTURE, BLOOD (ROUTINE X 2)
CULTURE: NO GROWTH
Culture: NO GROWTH

## 2015-08-22 LAB — HIV 1/2 AB DIFFERENTIATION
HIV 1 AB: NEGATIVE
HIV 2 AB: NEGATIVE

## 2015-08-22 LAB — HIV ANTIBODY (ROUTINE TESTING W REFLEX)

## 2015-08-22 LAB — RNA QUALITATIVE

## 2015-08-25 ENCOUNTER — Encounter (HOSPITAL_COMMUNITY): Payer: Medicare PPO

## 2015-08-25 ENCOUNTER — Ambulatory Visit (HOSPITAL_BASED_OUTPATIENT_CLINIC_OR_DEPARTMENT_OTHER): Payer: Medicare PPO | Admitting: Gynecologic Oncology

## 2015-08-25 ENCOUNTER — Telehealth: Payer: Self-pay | Admitting: Hematology

## 2015-08-25 ENCOUNTER — Other Ambulatory Visit: Payer: Self-pay | Admitting: Hematology

## 2015-08-25 ENCOUNTER — Encounter: Payer: Self-pay | Admitting: Gynecologic Oncology

## 2015-08-25 ENCOUNTER — Ambulatory Visit (HOSPITAL_COMMUNITY)
Admission: RE | Admit: 2015-08-25 | Discharge: 2015-08-25 | Disposition: A | Discharge status: Home / Self Care | Payer: Medicare PPO | Source: Ambulatory Visit | Attending: Gynecologic Oncology | Admitting: Gynecologic Oncology

## 2015-08-25 ENCOUNTER — Telehealth: Payer: Self-pay | Admitting: *Deleted

## 2015-08-25 VITALS — BP 98/66 | HR 90 | Resp 18 | Ht 61.0 in

## 2015-08-25 DIAGNOSIS — N814 Uterovaginal prolapse, unspecified: Secondary | ICD-10-CM | POA: Diagnosis not present

## 2015-08-25 DIAGNOSIS — N812 Incomplete uterovaginal prolapse: Secondary | ICD-10-CM | POA: Diagnosis not present

## 2015-08-25 DIAGNOSIS — C52 Malignant neoplasm of vagina: Secondary | ICD-10-CM | POA: Diagnosis not present

## 2015-08-25 DIAGNOSIS — R938 Abnormal findings on diagnostic imaging of other specified body structures: Secondary | ICD-10-CM | POA: Diagnosis not present

## 2015-08-25 DIAGNOSIS — C774 Secondary and unspecified malignant neoplasm of inguinal and lower limb lymph nodes: Secondary | ICD-10-CM | POA: Insufficient documentation

## 2015-08-25 DIAGNOSIS — N765 Ulceration of vagina: Secondary | ICD-10-CM

## 2015-08-25 DIAGNOSIS — Z993 Dependence on wheelchair: Secondary | ICD-10-CM | POA: Diagnosis not present

## 2015-08-25 DIAGNOSIS — IMO0002 Reserved for concepts with insufficient information to code with codable children: Secondary | ICD-10-CM

## 2015-08-25 DIAGNOSIS — Z79899 Other long term (current) drug therapy: Secondary | ICD-10-CM | POA: Diagnosis not present

## 2015-08-25 DIAGNOSIS — R159 Full incontinence of feces: Secondary | ICD-10-CM | POA: Diagnosis not present

## 2015-08-25 DIAGNOSIS — C799 Secondary malignant neoplasm of unspecified site: Secondary | ICD-10-CM

## 2015-08-25 DIAGNOSIS — I10 Essential (primary) hypertension: Secondary | ICD-10-CM | POA: Diagnosis not present

## 2015-08-25 DIAGNOSIS — C801 Malignant (primary) neoplasm, unspecified: Secondary | ICD-10-CM | POA: Diagnosis not present

## 2015-08-25 NOTE — Telephone Encounter (Signed)
Per staff message and POF I have scheduled appts. Advised scheduler of appts. JMW  

## 2015-08-25 NOTE — Telephone Encounter (Signed)
Hosp f/u appt-s/w patient dtr Kenney Houseman and gave appt for 11/03 @ 10:30 lab/11 md Dr. Burr Medico

## 2015-08-25 NOTE — Progress Notes (Signed)
Consult Note: Gyn-Onc  Consult was requested by Dr. Burr Medico for the evaluation of Allison Chapman 69 y.o. female with SCC of unknown origin.  CC:  Chief Complaint  Patient presents with  . Squamous Cell Carcinoma    New Consult    Assessment/Plan:  Allison Chapman  is a 69 y.o.  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  Cervical and endometrial biopsies taken today to rule out gyn source.  Will order pelvic US to better evaluate the ovaries though primary SCC of the ovary is very rare.  Recommend proceeding with planned treatment for SCC of unknown origin per Med Onc's recommendations.  HPI: Allison Chapman is a 69 year old woman with a recent hospitalization in early October, 2016 for hypercalcemia.  As part of her workup for this condition she received a CT of the chest/abdo/pelvis.  This revealed pelvic retroperitoneal lymphadenopathy and hydroureters bilatearlly. There was bilateral small non-specific pulmonary nodules appreciated, but no clear primary source of malignancy.  A needle biopsy of the iliac lymphadenopathy was performed on 08/05/15 and confirmed metastatic squamous cell carcinoma.  She was treated for her hypercalcemia and Dr Burr Medico was consulted from medical oncology. Prior to that admission she had been living at home independently with no issues. She has subsequently become wheelchair bound with difficulty conversing and generalized weakness.  She had begun developing uterine prolapse only in the weeks preceeding her admission to hospital for hypercalcemia.  While in hospital she had a flexible sigmoidoscopy by Dr Watt Climes which revealed diverticuli but no malignancy.   She is incontinent of stool and feces. She denies vaginal bleeding.  Current Meds:  Outpatient Encounter Prescriptions as of 08/25/2015  Medication Sig  . calcitonin, salmon, (MIACALCIN) 200 UNIT/ACT nasal spray Place 1 spray into alternate nostrils daily.  . carvedilol (COREG)  3.125 MG tablet Take 1 tablet (3.125 mg total) by mouth 2 (two) times daily with a meal.  . feeding supplement, ENSURE ENLIVE, (ENSURE ENLIVE) LIQD Take 237 mLs by mouth 2 (two) times daily between meals.  . mirtazapine (REMERON) 15 MG tablet Take 1 tablet (15 mg total) by mouth at bedtime.  . pravastatin (PRAVACHOL) 20 MG tablet Take 1 tablet (20 mg total) by mouth daily.   No facility-administered encounter medications on file as of 08/25/2015.    Allergy: No Known Allergies  Social Hx:   Social History   Social History  . Marital Status: Divorced    Spouse Name: N/A  . Number of Children: N/A  . Years of Education: N/A   Occupational History  . Not on file.   Social History Main Topics  . Smoking status: Never Smoker   . Smokeless tobacco: Not on file  . Alcohol Use: No  . Drug Use: No  . Sexual Activity: Not on file   Other Topics Concern  . Not on file   Social History Narrative   Daughter Saba Gomm cell: [phone]    Past Surgical Hx:  Past Surgical History  Procedure Laterality Date  . Tubal ligation    . Flexible sigmoidoscopy N/A 08/20/2015    Procedure: FLEXIBLE SIGMOIDOSCOPY;  Surgeon: Clarene Essex, MD;  Location: [facility] END[facility]COPY;  Service: Endoscopy;  Laterality: N/A;    Past Medical Hx:  Past Medical History  Diagnosis Date  . Hypertension   . Encephalopathy acute   . Prolapsed uterus     stage IV  . Vaginal bleeding   . Hx of lymph node biopsy 08/05/2015  right iliac=Metastatic squamous cell carcinoma    Past Gynecological History:  P2 (SVD's). Uterine prolapse.  No LMP recorded. Patient is postmenopausal.  Family Hx:  Family History  Problem Relation Age of Onset  . Hypertension Mother   . Cancer Mother   . Lymphoma Mother   . Hyperthyroidism Daughter     Review of Systems:  Constitutional  Feels generally weak  ENT Normal appearing ears and nares bilaterally Skin/Breast  No rash, sores, jaundice, itching,  dryness Cardiovascular  No chest pain, shortness of breath, or edema  Pulmonary  No cough or wheeze.  Gastro Intestinal  No N&V, + abdominal pains. Genito Urinary  + incontinence, no post menopausal bleeding Musculo Skeletal  No myalgia, arthralgia, joint swelling or pain  Neurologic  + generalized weakness and inability to ambulate. Psychology  No depression, anxiety, insomnia.   Vitals:  Blood pressure 98/66, pulse 90, resp. rate 18, height 5\' 1"  (1.549 m).  Physical Exam: WD in NAD Neck  Supple NROM, without any enlargements.  Lymph Node Survey No cervical supraclavicular or no inguinal adenopathy Cardiovascular  Pulse normal rate, regularity and rhythm. S1 and S2 normal.  Lungs  Clear to auscultation bilateraly, without wheezes/crackles/rhonchi. Good air movement.  Skin  No rash/lesions/breakdown  Psychiatry  Alert and oriented to person, place, and time  Abdomen  Normoactive bowel sounds, abdomen soft, non-tender and thin without evidence of hernia.  Back No CVA tenderness Genito Urinary  Vulva/vagina: Normal external female genitalia.  No vulvar or anal lesions. No discharge or bleeding. Stage IV complete procidentia. Anterior and posterior walls of vagina external to body.  Bladder/urethra:  prolapse  Vagina: ulceration on lateral vaginal side walls in close approximation to cervix. Biopsy taken from right sided ulcer.  Cervix: Prolapsed. Small raised 1cm lesion on posterior lip at 6 o'clock. Smooth. Does not appear consistent with SCC of cervix. Biopsied at this site.   Uterus: Prolapse, stage IV. 12 week size. no parametrial involvement or nodularity. Endometrial pipelle taken.  Adnexa: no palpable masses. Rectal  Good tone, no masses no cul de sac nodularity.  Extremities  No bilateral cyanosis, clubbing or edema.  PROCEDURE NOTE:  Patient and her daughter provided verbal consent to biopsies. Cervix and vaginal ulcer were prepped with betadine. A  tischler forcep was used to take a biopsy from the right vaginal ulcer at the leading edge and the 6 o'clock cervix.  An endometrial pipelle was passed to 10cm depth in uterus to take an endometrial biopsy and 2 passes were taken of a small amount of white tissue.   Donaciano Eva, MD  08/25/2015, 9:39 AM

## 2015-08-25 NOTE — Patient Instructions (Signed)
Ct scan schedule for Aug 26, 2015 at 1 PM today , we will contact you with results from the biopsies and CT scan . Pleases call with any changes , questions or concerns.  Thank you

## 2015-08-26 ENCOUNTER — Encounter: Payer: Self-pay | Admitting: Radiation Oncology

## 2015-08-26 NOTE — Progress Notes (Signed)
GYN Location of Tumor / Histology:  Allison Chapman presentedmonths ago with symptoms of:  Biopsies of  (if applicable) revealed: [birth year]: endometrial bx, ecto cervix 6 o'clock, bx; & right vaginal ulcer bx specimen= Results: 1.Endometrium bx=inactiveEndometrium .benign squamous epithelium,no hyperplasia or malignancy 2.Cervix bx=benign squamous mucosa.No dysplasia or malignancy 3.Vagina bx=right vaginal ulcer=Invasive squamous cell carcinaoma  Diagnosis 08/05/15: Lymph node, needle/core biopsy, Right iliac- METASTATIC SQUAMOUS CELL CARCINOMA ,Unknown Primary   Past/Anticipated interventions by Gyn/Onc surgery, if any: Dr. Terrence Dupont Rossi,MD 08/25/15: ordered Pelvic US   Past/Anticipated interventions by medical oncology, if any: Dr. Burr Medico  Weight changes, if any:   Bowel/Bladder complaints, if any: incontinence stool,,denies vaginal bleeding, History of vaginal bleeding though,  ,Uterine prolapse  Nausea/Vomiting, if any:  Pain issues, if any:   SAFETY ISSUES: YES, W/C bound   Prior radiation?  NO  Pacemaker/ICD? NO  Possible current pregnancy? N/A  Is the patient on methotrexate? NO  Current Complaints / other details: Divorced, daughter , Altered Mental status  ,recent d/c Hospital , Sigmoidoscopy 08/20/15 = diverticula no malignancy Dr. Watt Climes, Marc,MD, Encephalopathy,acute, HTN, non smoker, no smokeless tobacco,alcohol or drug use Mother Cancer-Lymphoma,   Allergies: NKA

## 2015-08-27 ENCOUNTER — Ambulatory Visit: Admission: RE | Admit: 2015-08-27 | Payer: Medicare PPO | Source: Ambulatory Visit

## 2015-08-27 ENCOUNTER — Ambulatory Visit
Admission: RE | Admit: 2015-08-27 | Discharge: 2015-08-27 | Disposition: A | Discharge status: Home / Self Care | Payer: Medicare PPO | Source: Ambulatory Visit | Attending: Radiation Oncology | Admitting: Radiation Oncology

## 2015-08-27 ENCOUNTER — Ambulatory Visit: Payer: Medicare PPO | Admitting: Radiation Oncology

## 2015-08-27 ENCOUNTER — Encounter (HOSPITAL_COMMUNITY)
Admission: RE | Admit: 2015-08-27 | Discharge: 2015-08-27 | Disposition: A | Discharge status: Home / Self Care | Payer: Medicare PPO | Source: Ambulatory Visit | Attending: Hematology | Admitting: Hematology

## 2015-08-27 ENCOUNTER — Ambulatory Visit: Admission: RE | Admit: 2015-08-27 | Payer: Medicare PPO | Source: Ambulatory Visit | Admitting: Radiation Oncology

## 2015-08-27 DIAGNOSIS — C799 Secondary malignant neoplasm of unspecified site: Secondary | ICD-10-CM | POA: Insufficient documentation

## 2015-08-27 DIAGNOSIS — C801 Malignant (primary) neoplasm, unspecified: Secondary | ICD-10-CM | POA: Insufficient documentation

## 2015-08-27 DIAGNOSIS — IMO0002 Reserved for concepts with insufficient information to code with codable children: Secondary | ICD-10-CM

## 2015-08-27 LAB — GLUCOSE, CAPILLARY: Glucose-Capillary: 83 mg/dL (ref 65–99)

## 2015-08-27 MED ORDER — FLUDEOXYGLUCOSE F - 18 (FDG) INJECTION
6.5000 | Freq: Once | INTRAVENOUS | Status: DC | PRN
  Administered 2015-08-27: 6.5 via INTRAVENOUS
  Filled 2015-08-27: qty 6.5

## 2015-08-28 ENCOUNTER — Encounter: Payer: Medicare PPO | Admitting: Hematology

## 2015-08-28 ENCOUNTER — Other Ambulatory Visit: Payer: Medicare PPO

## 2015-08-28 ENCOUNTER — Ambulatory Visit: Payer: Medicare PPO

## 2015-08-28 ENCOUNTER — Telehealth: Payer: Self-pay | Admitting: *Deleted

## 2015-08-28 NOTE — Telephone Encounter (Signed)
"  I need to talk with Dr. Burr Medico about cholesterol medication.  She was discharged last Thursday with Pravastatin.  It is causeing her legs to hurt and bone, joint pain.  Will the doctor change this to something else?  Dr. Burr Medico saw her in the hospital.  She saw Dr. Denman George Monday.  Return number [phone]."  Will notify Dr. Burr Medico.  Someone will call If any new orders.  This nurse advised she call her PCP as well.

## 2015-08-28 NOTE — Telephone Encounter (Signed)
I called her back and left a message, I suggest her mother to stop pravastatin, I also encouraged her to contact her primary care physician for this issue. I have transfer her oncology care to Dr. Denman George, and I encouraged her to contact Dr. Denman George for any oncological related issues in the future.   Allison Chapman  08/28/2015

## 2015-08-29 ENCOUNTER — Ambulatory Visit: Payer: Medicare PPO | Admitting: Internal Medicine

## 2015-09-01 ENCOUNTER — Telehealth: Payer: Self-pay | Admitting: Hematology

## 2015-09-01 ENCOUNTER — Telehealth: Payer: Self-pay | Admitting: Oncology

## 2015-09-01 ENCOUNTER — Other Ambulatory Visit: Payer: Self-pay | Admitting: Oncology

## 2015-09-01 ENCOUNTER — Telehealth: Payer: Self-pay | Admitting: Gynecologic Oncology

## 2015-09-01 DIAGNOSIS — C799 Secondary malignant neoplasm of unspecified site: Secondary | ICD-10-CM

## 2015-09-01 DIAGNOSIS — IMO0002 Reserved for concepts with insufficient information to code with codable children: Secondary | ICD-10-CM

## 2015-09-01 NOTE — Telephone Encounter (Signed)
I spoke with Dr. Denman George and my partner Dr. Marko Plume, who is a med/onc specialized treating GYN pregnancies, regarding her chemo and management, Dr. Marko Plume has agreed to take over her care and will see her tomorrow.   I spoke with pt's daughter Allison Chapman and she agrees with the plan.  [name]  09/01/2015

## 2015-09-01 NOTE — Telephone Encounter (Signed)
Informed patient's daughter of diagnosis of stage IV vaginal cancer. Recommend consideration for chemotherapy (cis, taxol). She will meet with Dr Marko Plume in the morning for evaluation. She may not have the performance status for this chemotherapy. We will cancer her radiation oncology appointment as this is not indicated in this clinical setting. Donaciano Eva, MD

## 2015-09-01 NOTE — Telephone Encounter (Signed)
Appointments made and left a message for daughter

## 2015-09-01 NOTE — Progress Notes (Signed)
This encounter was created in error - please disregard.

## 2015-09-02 ENCOUNTER — Ambulatory Visit (HOSPITAL_BASED_OUTPATIENT_CLINIC_OR_DEPARTMENT_OTHER): Payer: Medicare PPO | Admitting: Oncology

## 2015-09-02 ENCOUNTER — Ambulatory Visit: Payer: Medicare PPO

## 2015-09-02 ENCOUNTER — Telehealth: Payer: Self-pay | Admitting: Oncology

## 2015-09-02 ENCOUNTER — Encounter: Payer: Self-pay | Admitting: Oncology

## 2015-09-02 ENCOUNTER — Encounter: Payer: Self-pay | Admitting: Family Medicine

## 2015-09-02 ENCOUNTER — Other Ambulatory Visit (HOSPITAL_BASED_OUTPATIENT_CLINIC_OR_DEPARTMENT_OTHER): Payer: Medicare PPO

## 2015-09-02 VITALS — BP 109/74 | HR 54 | Temp 97.5°F | Resp 18

## 2015-09-02 DIAGNOSIS — C778 Secondary and unspecified malignant neoplasm of lymph nodes of multiple regions: Secondary | ICD-10-CM | POA: Diagnosis not present

## 2015-09-02 DIAGNOSIS — C7800 Secondary malignant neoplasm of unspecified lung: Secondary | ICD-10-CM | POA: Diagnosis not present

## 2015-09-02 DIAGNOSIS — C52 Malignant neoplasm of vagina: Secondary | ICD-10-CM | POA: Insufficient documentation

## 2015-09-02 DIAGNOSIS — C7802 Secondary malignant neoplasm of left lung: Secondary | ICD-10-CM

## 2015-09-02 DIAGNOSIS — IMO0002 Reserved for concepts with insufficient information to code with codable children: Secondary | ICD-10-CM

## 2015-09-02 DIAGNOSIS — C775 Secondary and unspecified malignant neoplasm of intrapelvic lymph nodes: Secondary | ICD-10-CM | POA: Insufficient documentation

## 2015-09-02 DIAGNOSIS — C787 Secondary malignant neoplasm of liver and intrahepatic bile duct: Secondary | ICD-10-CM | POA: Diagnosis not present

## 2015-09-02 DIAGNOSIS — C7801 Secondary malignant neoplasm of right lung: Secondary | ICD-10-CM | POA: Insufficient documentation

## 2015-09-02 DIAGNOSIS — I6381 Other cerebral infarction due to occlusion or stenosis of small artery: Secondary | ICD-10-CM

## 2015-09-02 DIAGNOSIS — C771 Secondary and unspecified malignant neoplasm of intrathoracic lymph nodes: Secondary | ICD-10-CM | POA: Insufficient documentation

## 2015-09-02 DIAGNOSIS — S32000D Wedge compression fracture of unspecified lumbar vertebra, subsequent encounter for fracture with routine healing: Secondary | ICD-10-CM

## 2015-09-02 DIAGNOSIS — I10 Essential (primary) hypertension: Secondary | ICD-10-CM

## 2015-09-02 DIAGNOSIS — C799 Secondary malignant neoplasm of unspecified site: Secondary | ICD-10-CM

## 2015-09-02 DIAGNOSIS — S32000A Wedge compression fracture of unspecified lumbar vertebra, initial encounter for closed fracture: Secondary | ICD-10-CM | POA: Insufficient documentation

## 2015-09-02 DIAGNOSIS — C772 Secondary and unspecified malignant neoplasm of intra-abdominal lymph nodes: Secondary | ICD-10-CM | POA: Insufficient documentation

## 2015-09-02 LAB — COMPREHENSIVE METABOLIC PANEL (CC13)
ALBUMIN: 2.4 g/dL — AB (ref 3.5–5.0)
ALK PHOS: 77 U/L (ref 40–150)
ALT: 20 U/L (ref 0–55)
ANION GAP: 17 meq/L — AB (ref 3–11)
AST: 24 U/L (ref 5–34)
BUN: 37.2 mg/dL — ABNORMAL HIGH (ref 7.0–26.0)
CALCIUM: 10.9 mg/dL — AB (ref 8.4–10.4)
CO2: 25 mEq/L (ref 22–29)
Chloride: 102 mEq/L (ref 98–109)
Creatinine: 1.6 mg/dL — ABNORMAL HIGH (ref 0.6–1.1)
EGFR: 38 mL/min/{1.73_m2} — AB (ref >90)
Glucose: 113 mg/dl (ref 70–140)
POTASSIUM: 3.3 meq/L — AB (ref 3.5–5.1)
Sodium: 144 mEq/L (ref 136–145)
Total Bilirubin: 0.84 mg/dL (ref 0.20–1.20)
Total Protein: 8 g/dL (ref 6.4–8.3)

## 2015-09-02 LAB — CBC WITH DIFFERENTIAL/PLATELET
BASO%: 0.4 % (ref 0.0–2.0)
Basophils Absolute: 0.1 10*3/uL (ref 0.0–0.1)
EOS%: 0.8 % (ref 0.0–7.0)
Eosinophils Absolute: 0.1 10*3/uL (ref 0.0–0.5)
HEMATOCRIT: 39.2 % (ref 34.8–46.6)
HEMOGLOBIN: 11.8 g/dL (ref 11.6–15.9)
LYMPH#: 1.4 10*3/uL (ref 0.9–3.3)
LYMPH%: 10.5 % — AB (ref 14.0–49.7)
MCH: 29 pg (ref 25.1–34.0)
MCHC: 30.1 g/dL — ABNORMAL LOW (ref 31.5–36.0)
MCV: 96.3 fL (ref 79.5–101.0)
MONO#: 0.8 10*3/uL (ref 0.1–0.9)
MONO%: 5.7 % (ref 0.0–14.0)
NEUT#: 10.9 10*3/uL — ABNORMAL HIGH (ref 1.5–6.5)
NEUT%: 82.6 % — AB (ref 38.4–76.8)
Platelets: 440 10*3/uL — ABNORMAL HIGH (ref 145–400)
RBC: 4.07 10*6/uL (ref 3.70–5.45)
RDW: 18.2 % — ABNORMAL HIGH (ref 11.2–14.5)
WBC: 13.1 10*3/uL — ABNORMAL HIGH (ref 3.9–10.3)
nRBC: 0 % (ref 0–0)

## 2015-09-02 LAB — MAGNESIUM (CC13): MAGNESIUM: 1.6 mg/dL (ref 1.5–2.5)

## 2015-09-02 NOTE — Progress Notes (Signed)
OFFICE PROGRESS NOTE   September 02, 2015   Physicians: Everitt Amber, [name]), M.Magod, [facility] (PCP) Referral to [facility]    INTERVAL HISTORY:  Patient is seen for first time by this MD, transferring from Dr Burr Medico due to diagnosis of metastatic squamous cell carcinoma of vagina, which has been particularly symptomatic with hypercalcemia. She is accompanied by 2 daughters, son and daughter in law. I have discussed directly with Dr Burr Medico, reviewed information in EMR, and discussed all of history and course to date with family in patient's presence now.  Patient had been very healthy and active until she fell at home in early Oct 2016 "right knee gave way"; she was on floor at home for at least several hours before family found her, concern then for right sided weakness. She was evaluated at ED, with no acute CVA by CT head and xrays right knee not remarkable. Labs were remarkable for a calcium of 14.5 (albumin 3.5) and PTH not elevated. She was given calcitonin initially, then pamidronate on 08-01-15. Uterine prolapse was identified. . CT pelvis 08-02-15 and chest abdomen 08-03-15 showed bulky necrotic iliac adenopathy with the uterine prolapse partially obstructing ureters. CT chest had small pulmonary nodules, CAD and CT abdomen had nonspecific retroperitoneal adenopathy. CT biopsy of right iliac lymph node 08-05-15 had metastatic squamous cell carcinoma ([id number]) She was readmitted 10-23 thru 08-21-15, calcium >15 and given pamidronate 08-18-15. Flex sig on 08-20-15 did not identify GI malignancy. She was seen in consultation by Dr Burr Medico and referred to Dr Denman George, whom she saw in clinic on 08-25-15. Biopsy of vaginal ulcer identified invasive squamous cell carcinoma, with endometrial biopsy and cervical biopsies benign ( [id number] ). PET 08-27-15 demonstrated hypermetabolic mets lungs, supraclavicular and central chest nodes, liver, retroperitoneal nodes and bulky involvement in  pelvis especially pelvic sidewalls.  Dr Denman George discussed pathology results and diagnosis of stage IV vaginal cancer with daughter on 09-01-15; Dr Denman George expressed concern that PS may not allow chemotherapy.   Patient has been at daughter's home since hospital discharge, with daughter in law staying with her during day there and daughter at night. She is no longer confused, as she had been in hospital, but is extremely weak and taking very little po. She is unable to walk and seems more weak on right side than on left. She drinks at most 1-2 supplement drinks daily, as well as water. She has had no vomiting and does not complain of nausea.  Last bowel movement was ~ 2 days ago. She has not seemed excessively thirsty. She has not complained of any pain, and denies that to me now. She has not appeared SOB, no cough, denies SOB now. They are not checking BP at home, still giving antihypertensive; they will hold BP meds now and follow check BP at home. Pravachol begun in hospital was held on 09-01-15 due to joint aches, does not need to be restarted. No bleeding. Is voiding. She has not been febrile.    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 Retired [employer] at [employer] and [employer]. Smoked x ~ 20 years, stopped 10 years ago. No ETOH. Daughter [name] [phone]  Objective:  Vital signs in last 24 hours: Unable to stand to be weighed, last recoreded 125 on 08-17-15 BP 109/74  mmHg  Pulse 54  Temp(Src) 97.5 F (36.4 C) (Oral)  Resp 18  Wt   SpO2 94%  Slumped in WC, appears to be dosing at times, rouses to voice and answers yes/ no questions, follows simple commands with exam.   HEENT:PERRL, sclerae not icteric. Oral mucosa somewhat dry, lips peeling,  tongue slight coating but no thrush or ulcers, posterior pharynx clear.   No JVD.  Lymphatics:no cervical,supraclavicular, axillary adenopathy.  Resp: diminished BS bilaterally without wheezes or rales and no use of accessory muscles Cardio: regular rate and rhythm. No gallop. GI: soft, nontender, not distended, no mass or organomegaly. Quiet.  Musculoskeletal/ Extremities: without pitting edema, cords, tenderness Neuro: grip 1-2+ right and 2+ left. No facial droop apparent.  Skin without rash, ecchymosis, petechiae   Lab Results:  Results for orders placed or performed in visit on 09/02/15  CBC with Differential  Result Value Ref Range   WBC 13.1 (H) 3.9 - 10.3 10e3/uL   NEUT# 10.9 (H) 1.5 - 6.5 10e3/uL   HGB 11.8 11.6 - 15.9 g/dL   HCT 39.2 34.8 - 46.6 %   Platelets 440 (H) 145 - 400 10e3/uL   MCV 96.3 79.5 - 101.0 fL   MCH 29.0 25.1 - 34.0 pg   MCHC 30.1 (L) 31.5 - 36.0 g/dL   RBC 4.07 3.70 - 5.45 10e6/uL   RDW 18.2 (H) 11.2 - 14.5 %   lymph# 1.4 0.9 - 3.3 10e3/uL   MONO# 0.8 0.1 - 0.9 10e3/uL   Eosinophils Absolute 0.1 0.0 - 0.5 10e3/uL   Basophils Absolute 0.1 0.0 - 0.1 10e3/uL   NEUT% 82.6 (H) 38.4 - 76.8 %   LYMPH% 10.5 (L) 14.0 - 49.7 %   MONO% 5.7 0.0 - 14.0 %   EOS% 0.8 0.0 - 7.0 %   BASO% 0.4 0.0 - 2.0 %   nRBC 0 0 - 0 %  Comprehensive metabolic panel (Cmet) - [facility]  Result Value Ref Range   Sodium 144 136 - 145 mEq/L   Potassium 3.3 (L) 3.5 - 5.1 mEq/L   Chloride 102 98 - 109 mEq/L   CO2 25 22 - 29 mEq/L   Glucose 113 70 - 140 mg/dl   BUN 37.2 (H) 7.0 - 26.0 mg/dL   Creatinine 1.6 (H) 0.6 - 1.1 mg/dL   Total Bilirubin 0.84 0.20 - 1.20 mg/dL   Alkaline Phosphatase 77 40 - 150 U/L   AST 24 5 - 34 U/L   ALT 20 0 - 55 U/L   Total Protein 8.0 6.4 - 8.3 g/dL   Albumin 2.4 (L) 3.5 - 5.0 g/dL   Calcium 10.9 (H) 8.4 - 10.4 mg/dL   Anion Gap 17 (H) 3 - 11 mEq/L   EGFR 38 (L) >90 ml/min/1.73 m2  Magnesium  Result Value Ref Range   Magnesium 1.6 1.5 - 2.5 mg/dl      Studies/Results: CT CHEST FINDINGS   08-03-15  Mediastinum/Lymph Nodes: Heart size is normal. There is no significant pericardial fluid, thickening or pericardial calcification. There is atherosclerosis of the thoracic aorta, the great vessels of the mediastinum and the coronary arteries, including calcified atherosclerotic plaque in the left main, left anterior descending and left circumflex coronary arteries. No pathologically enlarged mediastinal or hilar lymph nodes. Small hiatal hernia. No axillary lymphadenopathy.  Lungs/Pleura: 4 mm subpleural nodule in the anterior aspect of the right middle lobe (image 31 of series 205). A few other scattered 2-3 mm nodules are noted in the left upper  lobe, but are highly nonspecific. Larger more suspicious appearing pulmonary nodules or masses are otherwise noted. There is a background of moderate centrilobular emphysema. No acute consolidative airspace disease. No pleural effusions. Bibasilar areas of subsegmental atelectasis and/or scarring are noted dependently.  Musculoskeletal/Soft Tissues: There are no aggressive appearing lytic or blastic lesions noted in the visualized portions of the skeleton.  CT ABDOMEN FINDINGS  Hepatobiliary: Sub cm low-attenuation lesion segment 7 of the liver is too small to definitively characterize, but is statistically likely a tiny cyst. No other suspicious cystic or solid hepatic lesions are noted. Dependent high attenuation material within the gallbladder is most compatible with biliary sludge. No current findings to suggest an acute cholecystitis at this time.  Pancreas: No pancreatic mass. No pancreatic ductal dilatation. No pancreatic or peripancreatic fluid or inflammatory changes.  Spleen: Unremarkable.  Adrenals/Urinary Tract: Numerous well-defined low-attenuation lesions in the kidneys bilaterally, compatible with simple cysts, largest of which measures up to 7.9 cm in the  lower pole of the left kidney. Moderate to severe bilateral hydroureteronephrosis in the visualized portions of the abdomen. Bilateral adrenal glands are normal in appearance.  Stomach/Bowel: Normal appearance of the stomach. No pathologic dilatation of visualized portions of small bowel or colon. A few scattered colonic diverticulae are noted, without surrounding inflammatory changes to suggest an acute diverticulitis in the visualized portions of the abdomen at this time.  Vascular/Lymphatic: Extensive atherosclerosis throughout the abdominal vasculature, without evidence of aneurysm or dissection in the visualized abdomen. Numerous borderline enlarged and mildly enlarged retroperitoneal lymph nodes measuring up to 12 mm in short axis are noted in the left para-aortic nodal station.  Other: No significant volume of ascites in the visualized peritoneal cavity. No pneumoperitoneum.  Musculoskeletal: There are no aggressive appearing lytic or blastic lesions noted in the visualized portions of the skeleton. Compression fracture of superior endplate of L2 with approximately 30% loss of central vertebral body height.  IMPRESSION: 1. While there is some lymphadenopathy noted in the retroperitoneum, this is nonspecific. This could be reactive given the patient's uterine prolapse and associated bilateral hydroureteronephrosis. However, given the patient's prominent bilateral adnexal structures, which could represent lymph nodes or ovarian tissue, underlying malignancy is suspected. Further clinical correlation is suggested. 2. No other definite evidence of extra nodal metastatic disease in the abdomen. 3. There are several small nonspecific pulmonary nodules in the lungs bilaterally. While metastatic disease is not excluded, it is not strongly favored. The largest of these nodules is a 4 mm subpleural nodule in the anterior aspect of the right middle lobe (image 31 of series  205). Attention on any future followup studies is recommended to ensure stability. At the very least, a repeat noncontrast chest CT should be performed in 12 months. 4. Atherosclerosis, including left main and 2 vessel coronary artery disease. Assessment for potential risk factor modification, dietary therapy or pharmacologic therapy may be warranted, if clinically indicated. 5. Biliary sludge lying dependently in the gallbladder. No current findings to suggest an acute cholecystitis at this time. 6. Additional incidental findings, as above.  EXAM: CT PELVIS WITH CONTRAST  08-02-15  COMPARISON: None.  FINDINGS: I think there is complete prolapse of the vagina and uterus. The uterus is small, as expected at this age. Within the prolapse, I think that the posterior portion of the bladder also comes along with it. Anterior portion of the bladder retains a more normal position. The ureters are dilated to the point where they enter the prolapse on their way  to the posterior portion of the bladder. There is undoubtedly extrinsic compression of the ureters at this point.  Complicating this case, there are bilateral necrotic nodal masses along both external iliac chains, measuring approximately 4-5 cm in size. This is a very worrisome finding and suggests metastatic malignancy. At the upper portion of this pelvic scan, I think there is also a necrotic node measuring 15 mm in diameter to the left of the aorta on image 3.  There is advanced atherosclerosis of the aorta and the iliac and femoral vessels. There is ordinary degenerative change of the lower lumbar spine. There is an old compression deformity of L4.  IMPRESSION: Complete prolapse of the uterus which also brings with it the posterior portion of the bladder. The central portion of the bladder is collapsed and the anterior portion of the bladder retains a more normal position. Both ureters are dilated due to  extrinsic compression as they enter the prolapse on their way to the ureterovesical junctions in the portion of the bladder that is prolapsed.  Bilateral external iliac nodal masses with necrosis, 4-5 cm in diameter. This is very worrisome for metastatic malignancy. There is an additional necrotic node in the abdominal retroperitoneum to the left of the aorta on image 3.    EXAM: NUCLEAR MEDICINE PET SKULL BASE TO THIGH   08-27-15  COMPARISON: CT scan from 08/03/2015.  FINDINGS: NECK  No hypermetabolic lymph nodes in the neck.  CHEST  Hypermetabolic supraclavicular lymphadenopathy is seen bilaterally. Index right supraclavicular lymph node measures 1.6 cm in short axis (image 37 series 4) with SUV max = 23. Hypermetabolic mediastinal and hilar lymphadenopathy is associated. 13 mm short axis precarinal lymph node on image 55 series for demonstrates SUV max = 20.1. Hypermetabolic pulmonary nodules are evident with posterior right lower lobe nodule (image 64 series 4) demonstrating SUV max = 15.9.  There is mild hypermetabolism in the mid and distal esophagus without evidence for associated wall thickening on CT images. SUV max = 6.9.  ABDOMEN/PELVIS  Focal hypermetabolism is identified in the medial segment left liver inferiorly with SUV max = 7.1. No CT correlate on today's exam, but reviewing previous CT of 08/03/2015, ill-defined low-density lesion is seen in this region on image 56 of series 201. Hypermetabolic retroperitoneal lymphadenopathy is evident with 14 mm short axis left para-aortic lymph node demonstrating SUV max = 23.  12 mm left abdominal nodule (image 96 series 4) is likely omental with  Bulky lymphadenopathy in the anatomic pelvis is noted, as before. SUV max = 17. Arge necrotic right pelvic sidewall nodal conglomeration demonstrates SUV max = 41.  Multiple large bilateral renal cysts are evident. Abdominal aortic atherosclerosis is  identified. Bilateral hydroureteronephrosis is evident with ureteral dilatation down into the lower anatomic pelvis. Etiology for the ureteral obstruction appears to be the pelvic sidewall metastatic disease.  SKELETON  Focal hypermetabolism is identified in the anterior costochondral cartilage of the left sixth rib. Multiple hypermetabolic soft tissue lesions are evident, including 2.0 cm soft tissue nodule in the left paraspinal musculature (image 116 series 4) with SUV max = 14 .  IMPRESSION: Hypermetabolic metastases in the chest, abdomen, and pelvis with the most bulky lymphadenopathy be in in the pelvic sidewalls bilaterally. Metastatic disease includes pulmonary nodules, nodal metastases, soft tissue metastases, and likely liver and bone involvement. There is some low level FDG uptake in the mid and distal esophagus over a relatively long segment without associated wall thickening. No other convincing location for  primary malignancy is identified.  Bilateral hydroureteronephrosis with ureteral obstruction appears to be at the level of the pelvic sidewall bilaterally.    PACs images of body CTs and PET reviewed with family in presence of patient during visit     Macungie, Yoshiko F Collected: 08/05/2015 Client: [facility] Accession: [id number] Received: 08/05/2015 Tresa Garter DIAGNOSIS Diagnosis Lymph node, needle/core biopsy, Right iliac - METASTATIC SQUAMOUS CELL CARCINOMA.     EVELIA, WASKEY F Collected: 08/25/2015 Client: [facility] Accession: [id number] Received: 08/25/2015 Everitt Amber, MD REPORT OF SURGICAL PATHOLOGY FINAL DIAGNOSIS Diagnosis 1. Endometrium, biopsy - INACTIVE ENDOMETRIUM. - BENIGN SQUAMOUS EPITHELIUM. - NO HYPERPLASIA OR MALIGNANCY. 2. Cervix, biopsy, ectocervix 6 o'clock - BENIGN SQUAMOUS MUCOSA. - NO DYSPLASIA OR OR MALIGNANCY. 3. Vagina, biopsy, right vaginal ulcer - INVASIVE SQUAMOUS  CELL CARCINOMA.    Medications: I have reviewed the patient's current medications. She will remain off pravochol. She will hold coreg and family/ Hospice will monitor blood pressure.   DISCUSSION: all of above history and diagnostic information reviewed; family had not known of PET results prior to visit today. I have explained that this malignancy likely was present for some time prior to becoming acutely symptomatic with hypercalcemia. We have discussed symptoms of hypercalcemia and some benefit from hydration; they will be aware of possible constipation. I have explained that surgery would not be appropriate given widespread metastatic disease, and that treatment with systemic chemotherapy, if attempted, would be in attempt to control or slow disease but would not be curative. I have further explained that the chemotherapy agents most useful for metastatic squamous cell carcinoma of vagina are frequently associated with significant side effects and require fairly complicated and lengthy administration. I have told family that patient's performance status is too poor to expect any benefit from chemotherapy, which would likely only cause her additional significant discomfort and worsening of condition. We have discussed Hospice assistance at home, as this is an advanced and terminal malignancy; they are familiar with Hospice from involvement with patient's mother 2 years ago. We have discussed Advance Directives and I have given them written information; patient does not participate in this discussion and family wants to consider this. Family is understandably upset, particularly as this is such a dramatic and unexpected change from their mother's usual health. The daughters are tearful but seem to understand conversation; the son is angry and is focusing on events of hospitalizations. I have told them that we can repeat the bisphosphonate for hypercalcemia if needed/ appropriate in ~ 2 weeks, and they do  request appointment for this. Family would like to talk with Hospice and I have spoken with Hospice to make that referral now.   Assessment/Plan: 1.Metastatic squamous cell carcinoma of vagina to lungs, liver, chest/ supraclavicular/retroperitoneal/ pelvic nodes and pelvic sidewalls: performance status too poor to attempt CDDP taxol chemotherapy. Hospice referral. 2.hypercalcemia: secondary to metastatic squamous cell carcinoma. Calcium some better with interventions to date, but expect will continue to be symptomatic. At family's request, will see her back in 2 weeks, with stat chemistries and infusion time available for IVF and/or bisphosphonate. Could consider zometa in preference to pamidronate with shorter infusion and possibly longer benefit. I did let Hospice know that we may give IV bisphosphonate for the hypercalcemia, their medical director to be informed and I am glad to speak with him if needed. 3.Advance Directives discussed, family to consider 4.weakness multifactorial: may need additional equipment at home, Hospice to evaluate 5.past tobacco 6.family declines  flu vaccine for patient. We have discussed avoidance of others who are ill and I have recommended that family members be vaccinated. 7.chronic lacunar infarctions on CT and MRI head. Son upset that family was not told of these, tho I tried to explain that at that point the important differential was no acute CVA 8.L2 compression fracture by CT: I did not see this information until after visit and did not discuss with family. No pain. 9.CAD by CT. Given rest of situation agree with stopping pravachol   All questions answered. Family understands that they can call if concerns prior to next scheduled visit, and that I will stay in communication with Hospice. Zometa order entered for 11-21, managed care notified for preauthorization. Time spent 45 min including >50% counseling and coordination of care.    LIVESAY,LENNIS P, MD    09/02/2015, 11:19 AM

## 2015-09-02 NOTE — Telephone Encounter (Signed)
Appointments made and avs printed for patient °

## 2015-09-03 ENCOUNTER — Ambulatory Visit: Payer: Medicare PPO

## 2015-09-03 ENCOUNTER — Ambulatory Visit
Admission: RE | Admit: 2015-09-03 | Discharge: 2015-09-03 | Disposition: A | Discharge status: Home / Self Care | Payer: Medicare PPO | Source: Ambulatory Visit | Attending: Radiation Oncology | Admitting: Radiation Oncology

## 2015-09-03 ENCOUNTER — Inpatient Hospital Stay: Admission: RE | Admit: 2015-09-03 | Payer: Medicare PPO | Source: Ambulatory Visit

## 2015-09-03 ENCOUNTER — Ambulatory Visit: Payer: Medicare PPO | Admitting: Radiation Oncology

## 2015-09-03 ENCOUNTER — Ambulatory Visit: Admission: RE | Admit: 2015-09-03 | Payer: Medicare PPO | Source: Ambulatory Visit | Admitting: Radiation Oncology

## 2015-09-05 ENCOUNTER — Telehealth: Payer: Self-pay

## 2015-09-05 NOTE — Telephone Encounter (Signed)
Allison Chapman from hospice is at pt's house admitting her to hospice. She was clarifying the zometa dose and frequency. She stated the family said they were told to stop the calcitonin and remeron.

## 2015-09-09 ENCOUNTER — Encounter: Payer: Self-pay | Admitting: Student

## 2015-09-10 ENCOUNTER — Telehealth: Payer: Self-pay

## 2015-09-10 ENCOUNTER — Encounter: Payer: Self-pay | Admitting: Skilled Nursing Facility1

## 2015-09-10 NOTE — Progress Notes (Signed)
Subjective:     Patient ID: Allison Chapman, female   DOB: [birth year], 69 y.o.   MRN: [id number]  HPI   Review of Systems     Objective:   Physical Exam To assist the pt in identifying some dietary strategies to gain some lost wt back.    Assessment:     Pt identified as being malnourished due to wt loss. Pt was contacted via the telephone at [phone]. Pts daughter in law answered and stated the pt was napping. She states she will have one of the pts call the dietitian back.     Plan:     Dietitian left the number for [facility] CSO,RD,LDN.

## 2015-09-10 NOTE — Telephone Encounter (Signed)
Daughter Ms. Jacqulyn Cane called requesting the pathology reports confirming her mother's cancer diagnosis to sent to her [employer]. Faxed daughter the pathology reports from 08-05-15 and 08-25-15.  FaxOA:[phone]. Confirmed with daughter receipt of pathology reports. Ms. Jacqulyn Cane also stated that her mother's condition has declined since visit with Dr. Marko Plume on 09-02-15 that they called [facility] and mother was admitted to services on 08-05-15. Dr. Marko Plume is listed as the attending physician. Ms. Jacqulyn Cane stated that the appointment for visit and infusion on 09-15-15 can be cancelled. Appointments cancelled.

## 2015-09-11 ENCOUNTER — Encounter: Payer: Self-pay | Admitting: Oncology

## 2015-09-11 ENCOUNTER — Telehealth: Payer: Self-pay

## 2015-09-15 ENCOUNTER — Ambulatory Visit: Payer: Medicare PPO | Admitting: Oncology

## 2015-09-15 ENCOUNTER — Ambulatory Visit: Payer: Medicare PPO

## 2015-09-15 ENCOUNTER — Other Ambulatory Visit: Payer: Medicare PPO

## 2015-09-17 ENCOUNTER — Telehealth: Payer: Self-pay | Admitting: Oncology

## 2015-09-17 NOTE — Telephone Encounter (Signed)
Received death certificate [id number]

## 2015-09-25 NOTE — Progress Notes (Signed)
Medical Oncology  Per Hospice, patient expired this AM. Sympathy letter written to family, and will let other MDs know.  Godfrey Pick, MD

## 2015-09-25 NOTE — Telephone Encounter (Signed)
Allison Chapman called to inform Dr. Marko Plume that Allison Chapman  Passed away at [place] 2015-09-25.

## 2015-09-25 DEATH — deceased

## 2015-10-30 ENCOUNTER — Encounter: Payer: Self-pay | Admitting: Oncology

## 2015-10-30 NOTE — Progress Notes (Signed)
I placed cancer policy forms for dr Marko Plume. Kenney Houseman her daughter is to be contacted once completed.

## 2015-11-07 NOTE — Progress Notes (Signed)
Ms. Arminda Resides called for an update on the processing of the policy form.  Told Ms. Fennel that the forms are on Dr. Mariana Kaufman desk to be signed.  Dr. Marko Plume has been out of the office since 10-30-15.  She will be returning on 11-10-15.   Raquel Marlon Pel will call her when they are reading to be picked up. Daughter appreciated the information.

## 2015-11-11 ENCOUNTER — Encounter: Payer: Self-pay | Admitting: Oncology

## 2015-11-11 NOTE — Progress Notes (Signed)
Kenney Houseman will come and get forms from ms wilma front desk.

## 2015-11-12 IMAGING — CT CT PELVIS W/ CM
2 of 6 series · 14 of 46 positions shown, 18 images · IV contrast (APPLIED)
Comparison: None.

CLINICAL DATA: Vaginal bleeding.  Hypercalcemia.  Uterine prolapse.

EXAM:
CT PELVIS WITH CONTRAST
TECHNIQUE: Multidetector CT imaging of the pelvis was performed using the
standard protocol following the bolus administration of intravenous
contrast.
CONTRAST:  100mL OMNIPAQUE IOHEXOL 300 MG/ML  SOLN

[Series 2: pelvis 5.0 i30f 1 · axial · 0.75mm/px · z∈[+894,+1139]mm · 11 of 57 slices shown, 15 images]
[im 5/57  soft-tissue]
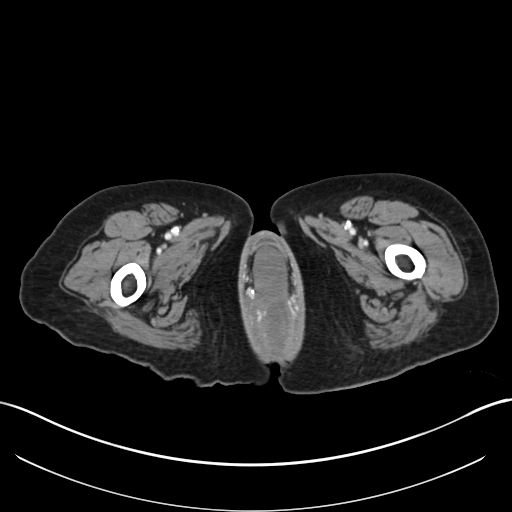
[im 5/57  bone]
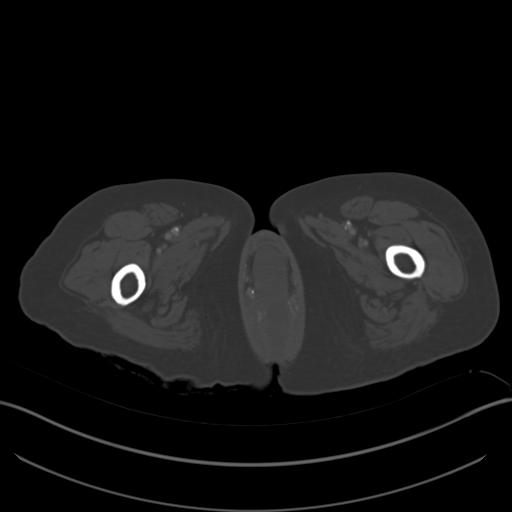
[im 12/57  soft-tissue]
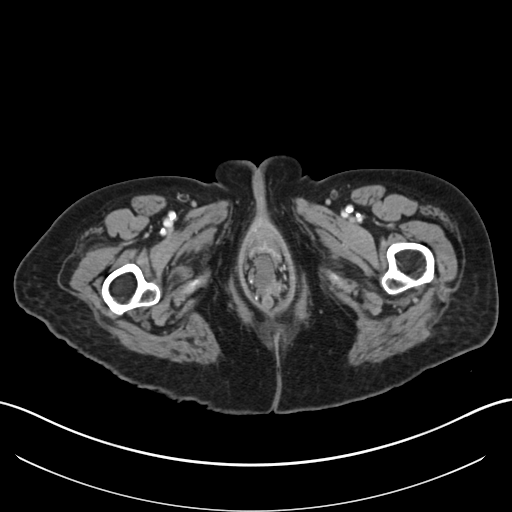
[im 16/57  soft-tissue]
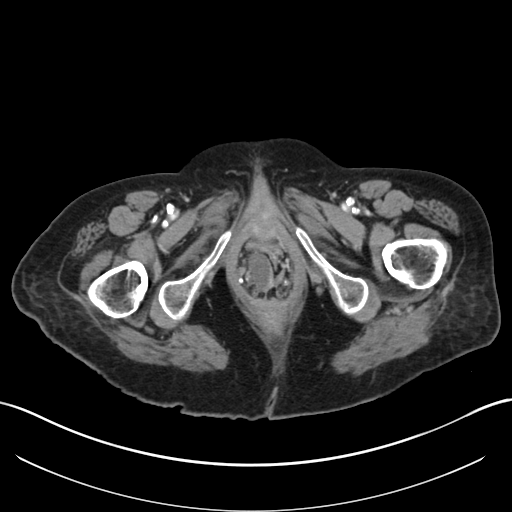
[im 23/57  soft-tissue]
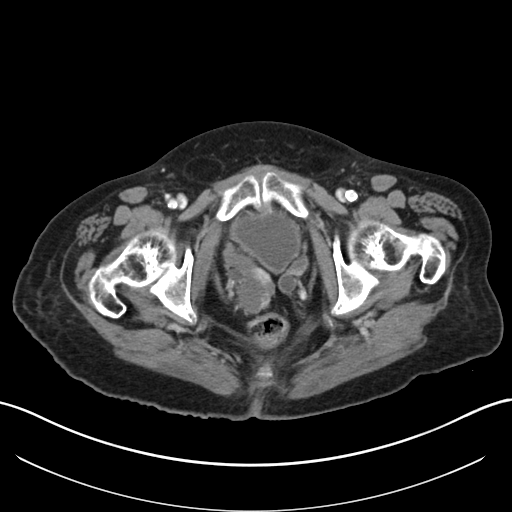
[im 30/57  soft-tissue]
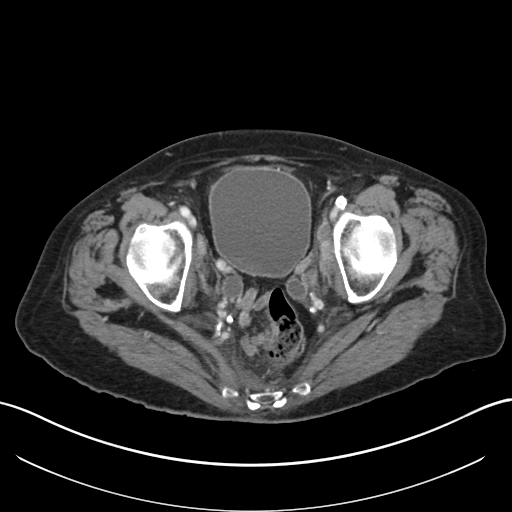
[im 34/57  soft-tissue]
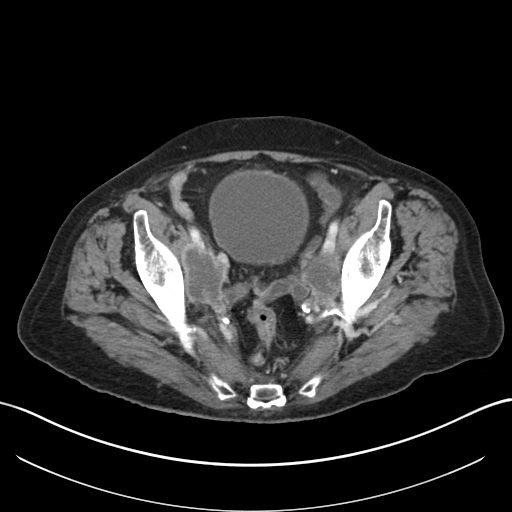
[im 41/57  soft-tissue]
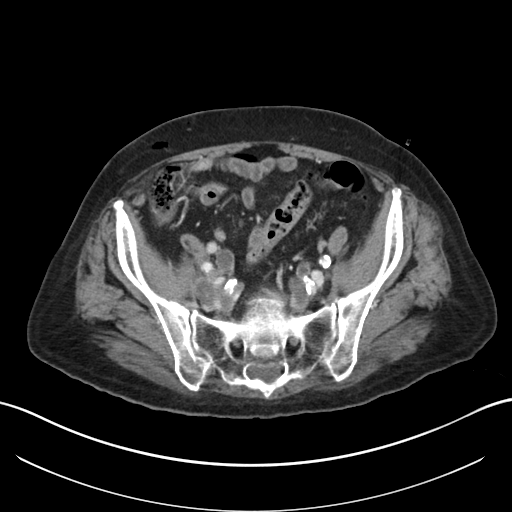
[im 48/57  soft-tissue]
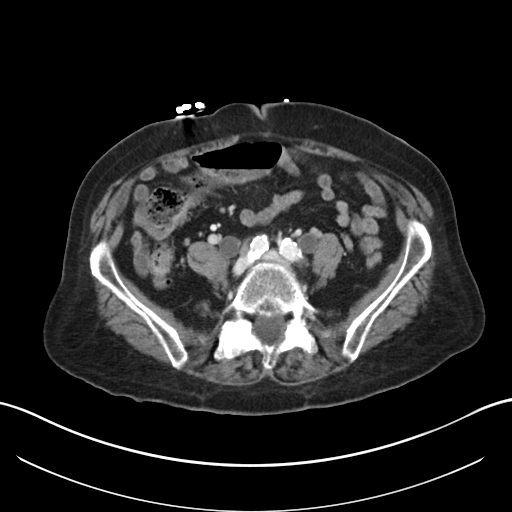
[im 48/57  lung]
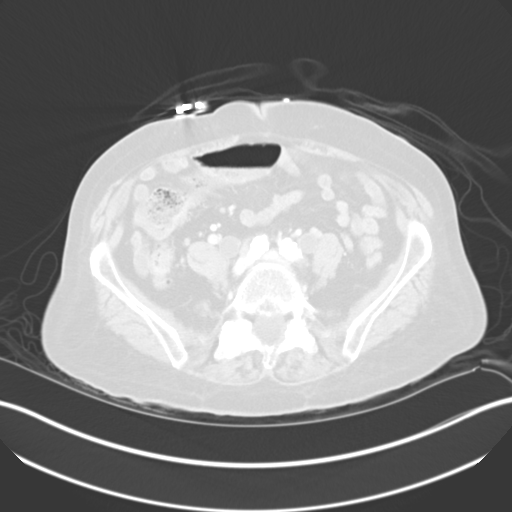
[im 50/57  lung]
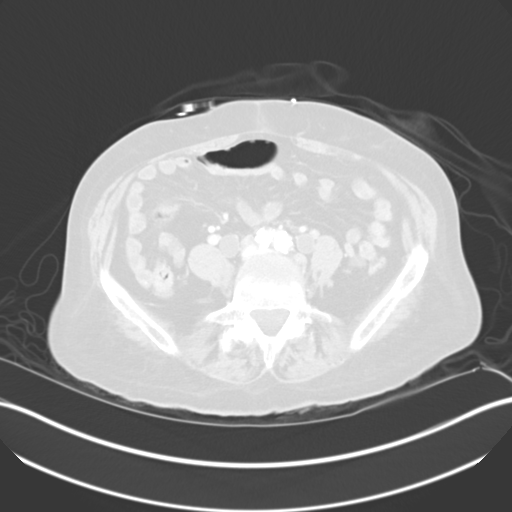
[im 52/57  soft-tissue]
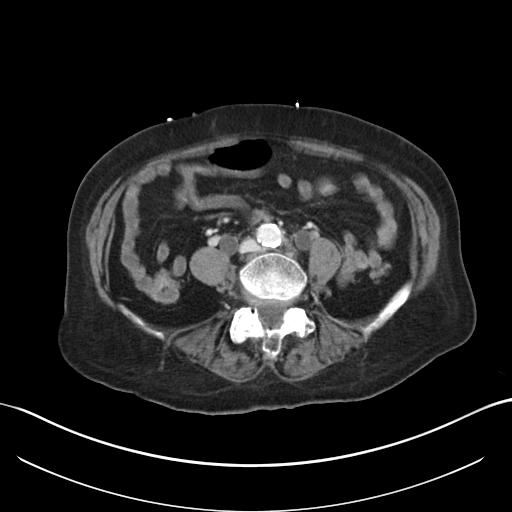
[im 52/57  lung]
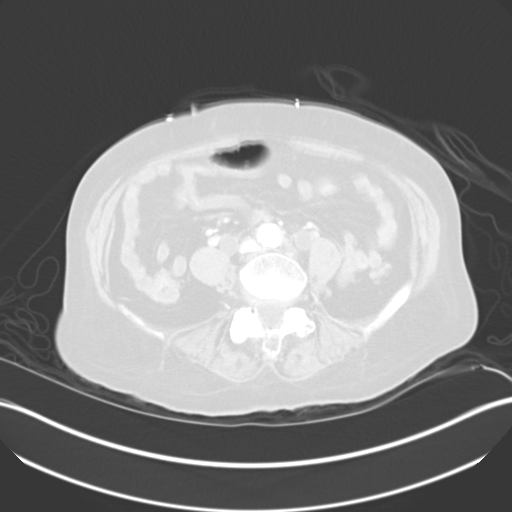
[im 52/57  bone]
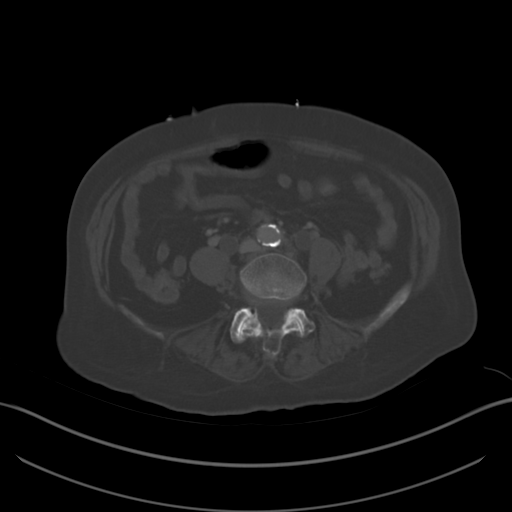
[im 54/57  lung]
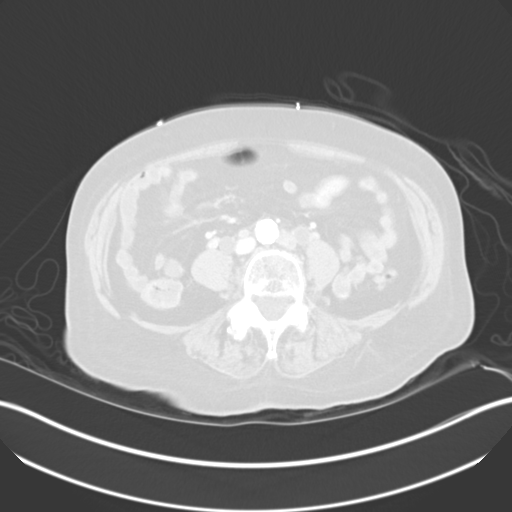

[Series 6: coronal soft tissue · coronal · 0.63mm/px · 3 of 73 slices shown]
[im 15/73  soft-tissue]
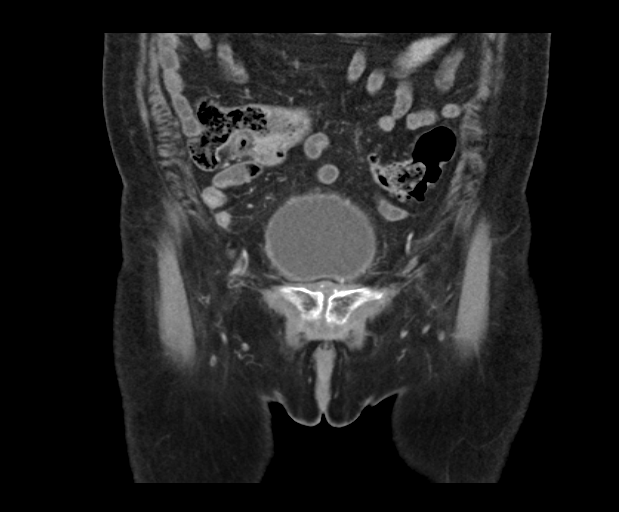
[im 29/73  soft-tissue]
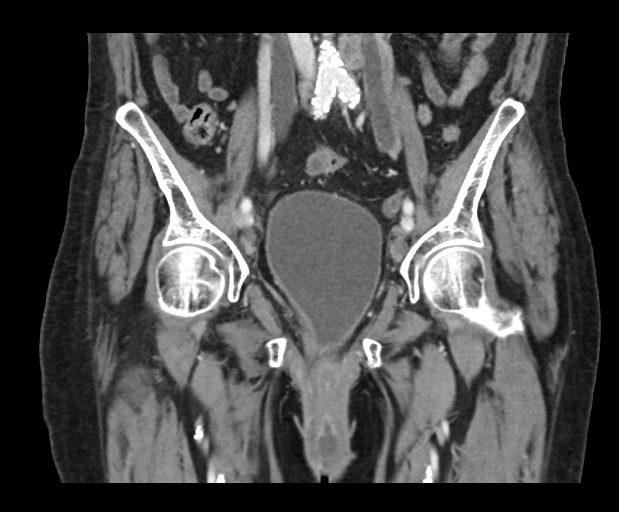
[im 44/73  soft-tissue]
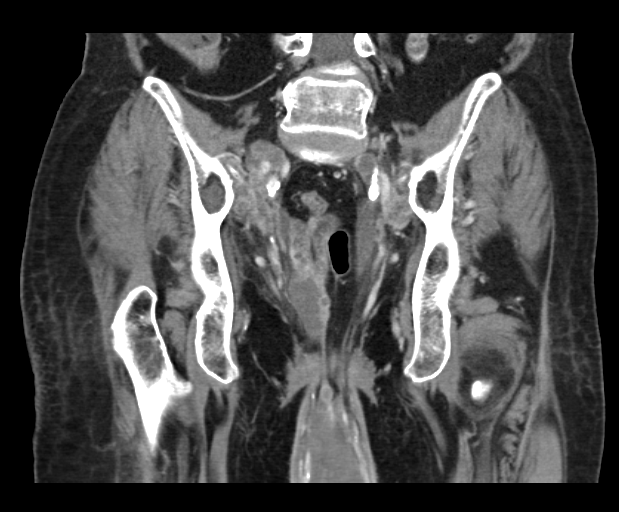

[14 of 46 positions shown; findings below may reference images not displayed]

FINDINGS: I think there is complete prolapse of the vagina and uterus. The
uterus is small, as expected at this age. Within the prolapse, I
think that the posterior portion of the bladder also comes along
with it. Anterior portion of the bladder retains a more normal
position. The ureters are dilated to the point where they enter the
prolapse on their way to the posterior portion of the bladder. There
is undoubtedly extrinsic compression of the ureters at this point.

Complicating this case, there are bilateral necrotic nodal masses
along both external iliac chains, measuring approximately 4-5 cm in
size. This is a very worrisome finding and suggests metastatic
malignancy. At the upper portion of this pelvic scan, I think there
is also a necrotic node measuring 15 mm in diameter to the left of
the aorta on image 3.

There is advanced atherosclerosis of the aorta and the iliac and
femoral vessels. There is ordinary degenerative change of the lower
lumbar spine. There is an old compression deformity of L4.
IMPRESSION: Complete prolapse of the uterus which also brings with it the
posterior portion of the bladder. The central portion of the bladder
is collapsed and the anterior portion of the bladder retains a more
normal position. Both ureters are dilated due to extrinsic
compression as they enter the prolapse on their way to the
ureterovesical junctions in the portion of the bladder that is
prolapsed.

Bilateral external iliac nodal masses with necrosis, 4-5 cm in
diameter. This is very worrisome for metastatic malignancy. There is
an additional necrotic node in the abdominal retroperitoneum to the
left of the aorta on image 3.

## 2016-01-29 ENCOUNTER — Telehealth: Payer: Self-pay | Admitting: *Deleted

## 2016-01-29 NOTE — Telephone Encounter (Signed)
Daughter [name] [phone]) called asking for "Provider section of an insurance form be completed by MD.  She had two different policies is why Insurance needs a new form." Instructed to bring form, complete cover sheet at front desk for completion.

## 2016-02-02 ENCOUNTER — Encounter: Payer: Self-pay | Admitting: Oncology

## 2016-02-02 NOTE — Progress Notes (Signed)
left in box, left for [email or web address] to sign

## 2016-04-12 ENCOUNTER — Other Ambulatory Visit: Payer: Self-pay | Admitting: Nurse Practitioner

## 2016-04-18 IMAGING — US US PELVIS COMPLETE
1 series · 13 of 25 positions shown · non-contrast
Comparison: Prior CT of the pelvis on 08/02/2015 and [HOSPITAL] the
time of CT-guided biopsy on 08/05/2015.

CLINICAL DATA: Metastatic squamous carcinoma to iliac chain lymph
nodes and status post biopsy of new chronic right iliac lymph node.

EXAM:
TRANSABDOMINAL ULTRASOUND OF PELVIS
TECHNIQUE: Transabdominal ultrasound examination of the pelvis was performed
including evaluation of the uterus, ovaries, adnexal regions, and
pelvic cul-de-sac.

[Series 1: us pelvis complete · 0.24mm/px · 13 of 42 slices shown]
[im 1/42]
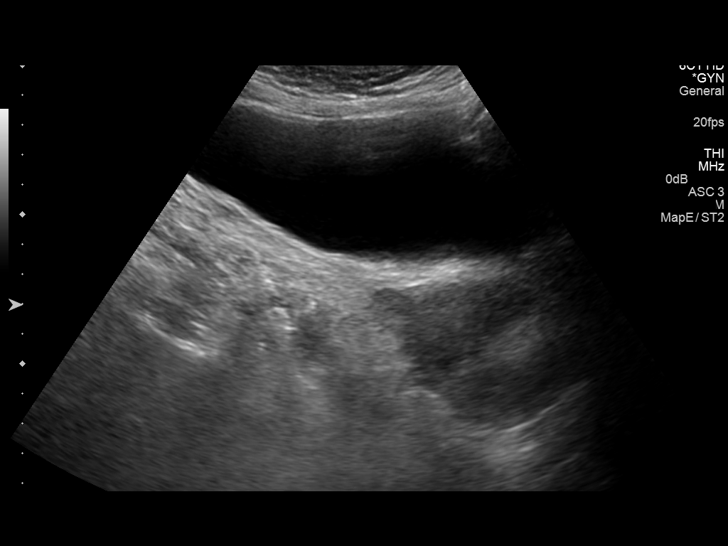
[im 4/42]
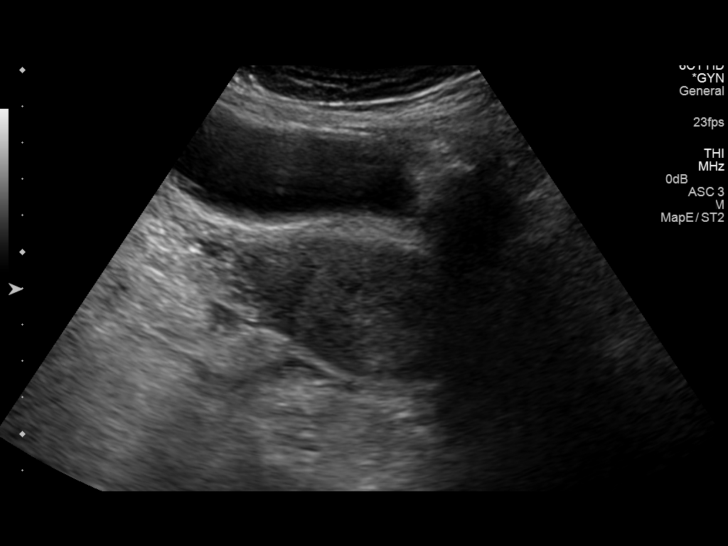
[im 7/42]
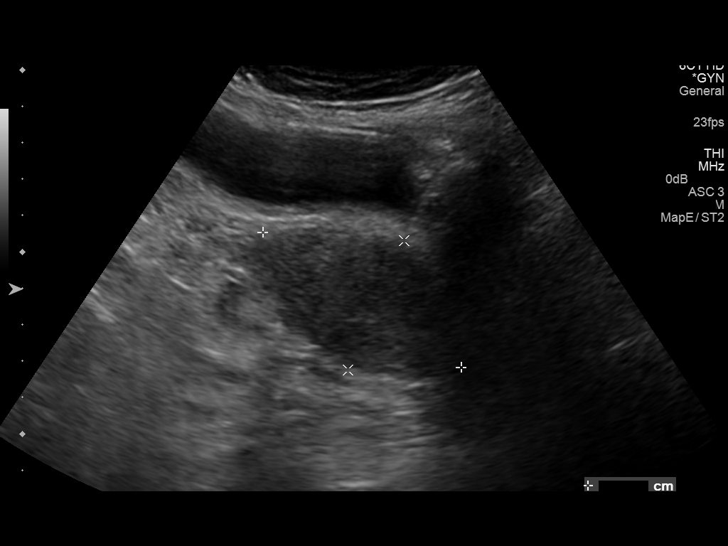
[im 11/42]
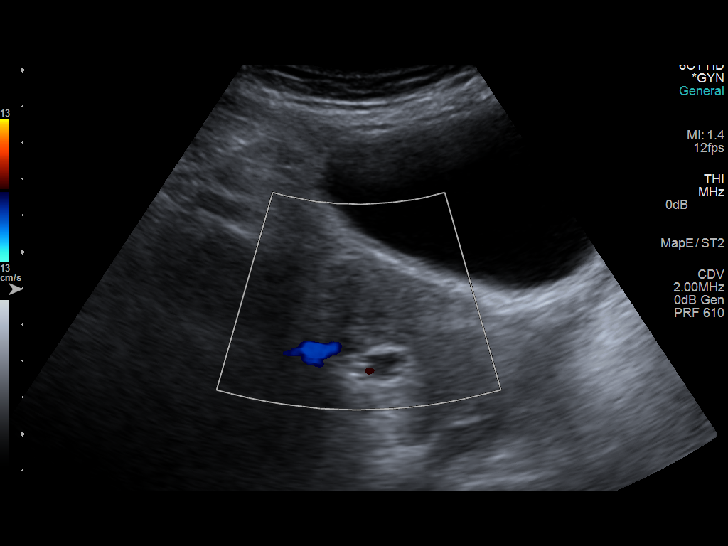
[im 14/42]
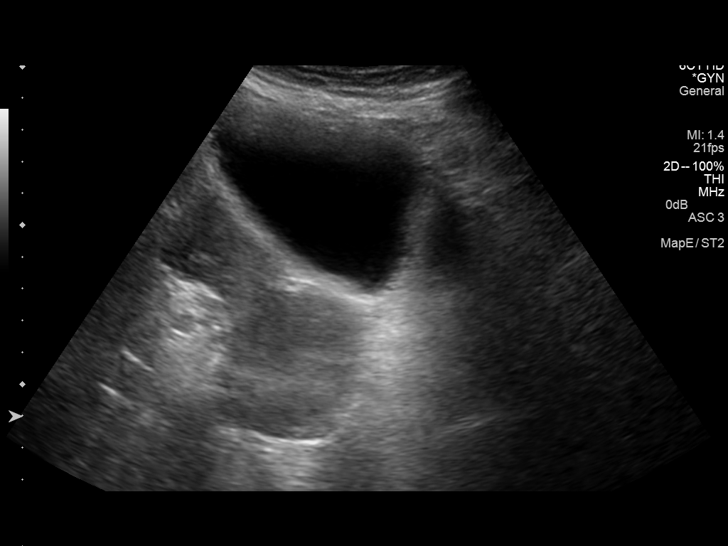
[im 18/42]
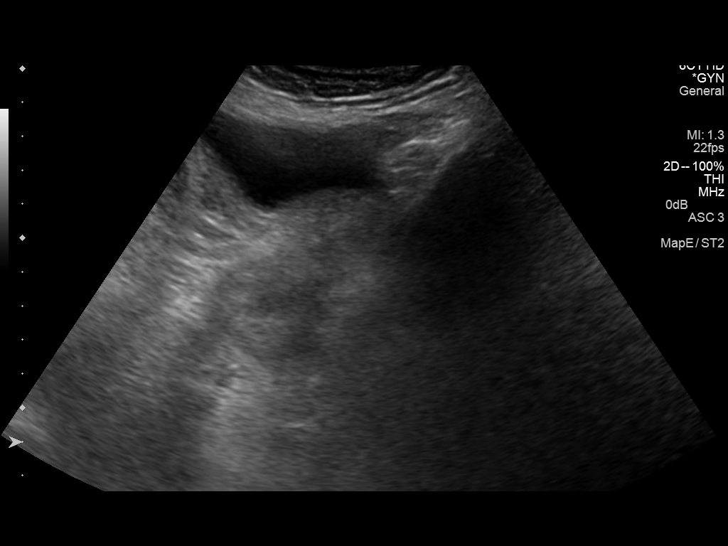
[im 21/42]
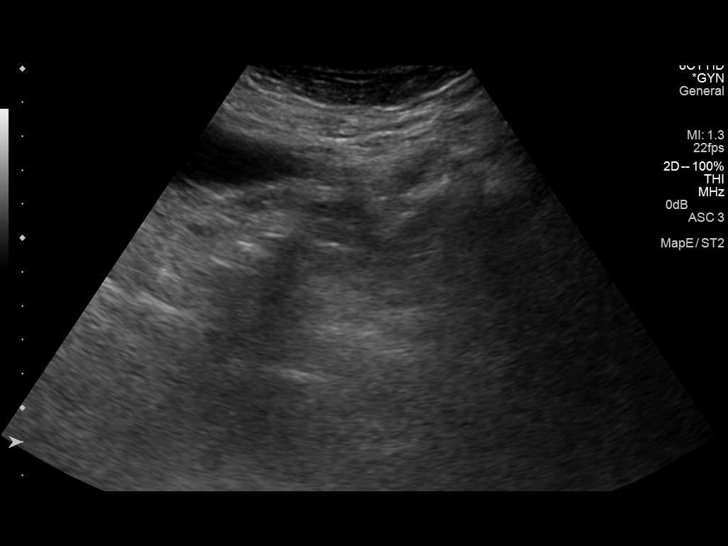
[im 24/42]
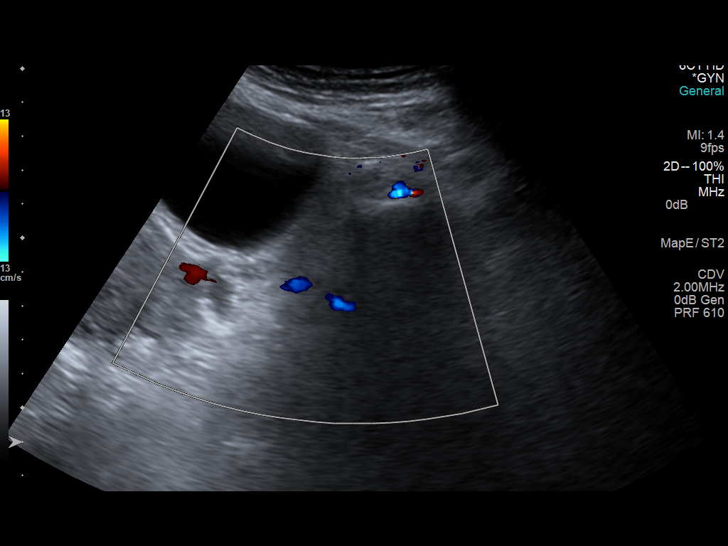
[im 28/42]
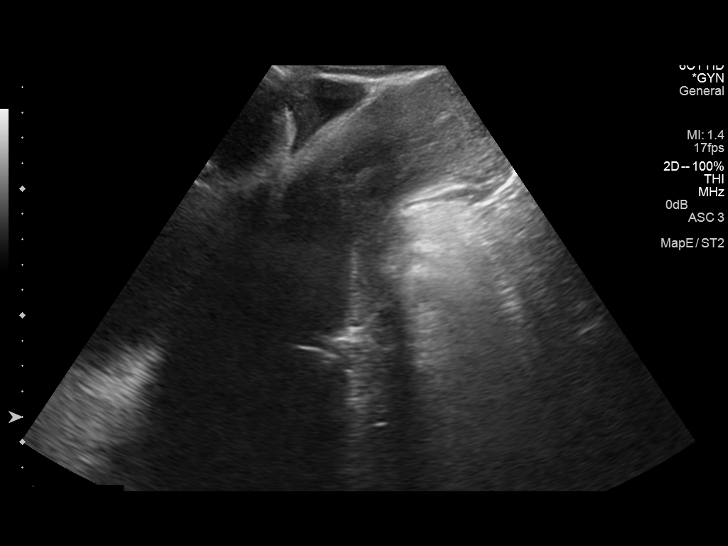
[im 31/42]
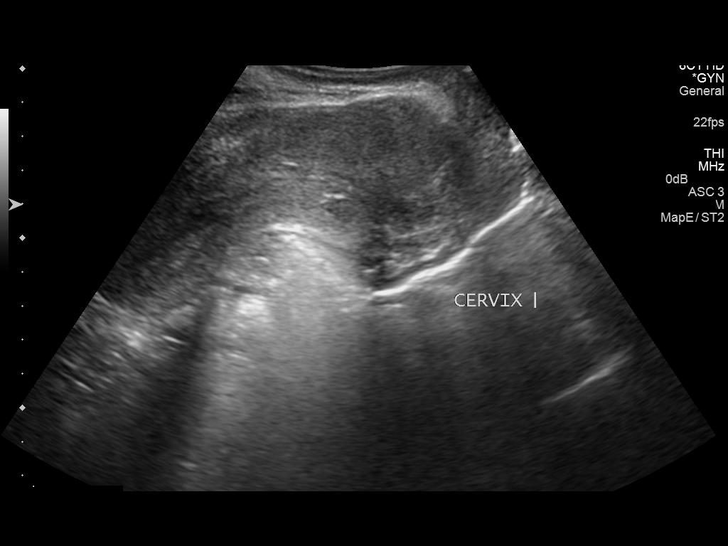
[im 35/42]
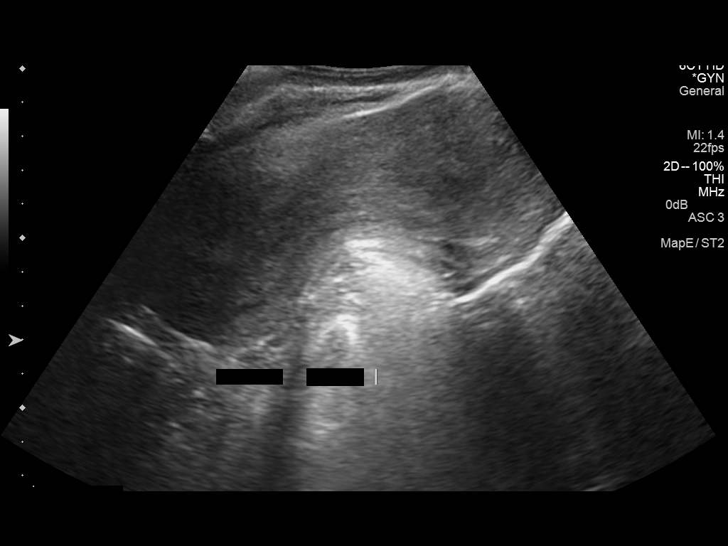
[im 38/42]
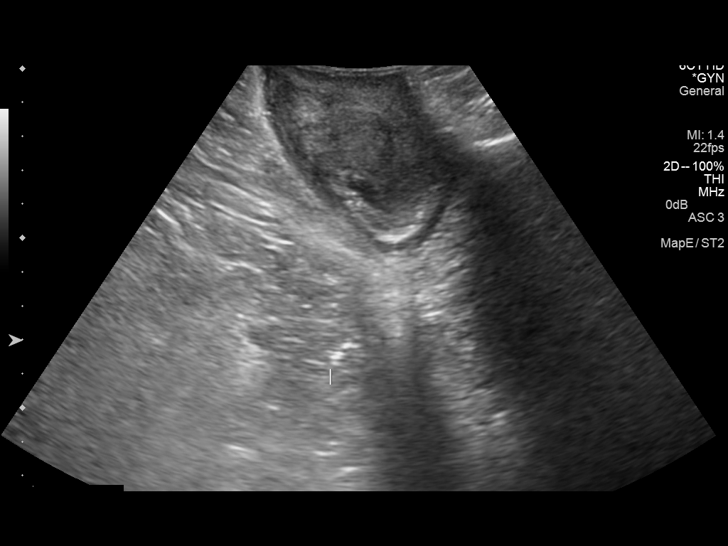
[im 42/42]
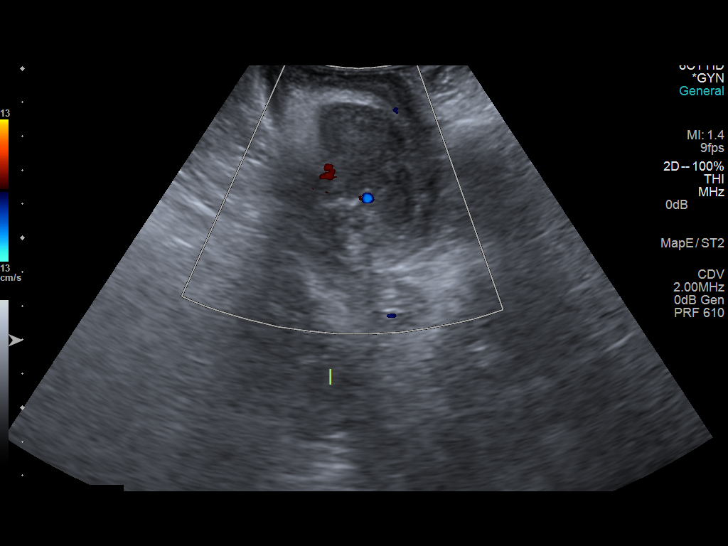

[13 of 25 positions shown; findings below may reference images not displayed]

FINDINGS: Uterus

Measurements: 7.4 x 5.2 x 4.6 cm. The uterus is completely prolapsed
through the pelvic floor and therefore a transvaginal examination
could not be performed. Trans-labial imaging shows heterogeneous and
thickened endometrium measuring up to 20 mm in thickness.
Endometrial malignancy cannot be excluded. Other than the abnormally
thick endometrium, no focal uterine masses are identified by
ultrasound.

Right ovary

The right ovary is not definitively visualized.

Left ovary

The left ovary is not definitively visualized.

Other findings: No free fluid identified. Soft tissue in the right
pelvis adjacent to the iliac vessels was measured at approximately
6.6 x 3.9 x 3.3 cm. This is felt to most likely represent the
enlarged iliac chain lymphadenopathy present by CT and sampled by
CT-guided biopsy previously.
IMPRESSION: 1. Abnormally thickened and heterogeneous endometrium measuring 20
mm in thickness. This is worrisome for potential endometrial
malignancy.
2. No identifiable discrete ovarian tissue bilaterally.
3. Right pelvic sidewall mass measured by ultrasound likely
represents the enlarged iliac chain lymphadenopathy previously
imaged by CT.
